# Patient Record
Sex: Female | Born: 1940 | Race: White | Hispanic: No | Smoking: Former smoker
Health system: Southern US, Community
[De-identification: ages and names within clinical notes are randomized; demographics above are authoritative.]

## PROBLEM LIST (undated history)

## (undated) DIAGNOSIS — E11649 Type 2 diabetes mellitus with hypoglycemia without coma: Secondary | ICD-10-CM

## (undated) DIAGNOSIS — G9332 Myalgic encephalomyelitis/chronic fatigue syndrome: Secondary | ICD-10-CM

## (undated) DIAGNOSIS — R5382 Chronic fatigue, unspecified: Secondary | ICD-10-CM

## (undated) DIAGNOSIS — M797 Fibromyalgia: Secondary | ICD-10-CM

## (undated) DIAGNOSIS — L409 Psoriasis, unspecified: Principal | ICD-10-CM

## (undated) DIAGNOSIS — E079 Disorder of thyroid, unspecified: Secondary | ICD-10-CM

## (undated) DIAGNOSIS — I10 Essential (primary) hypertension: Secondary | ICD-10-CM

## (undated) DIAGNOSIS — J189 Pneumonia, unspecified organism: Secondary | ICD-10-CM

## (undated) DIAGNOSIS — M4850XA Collapsed vertebra, not elsewhere classified, site unspecified, initial encounter for fracture: Secondary | ICD-10-CM

## (undated) DIAGNOSIS — E7800 Pure hypercholesterolemia, unspecified: Secondary | ICD-10-CM

## (undated) DIAGNOSIS — M170 Bilateral primary osteoarthritis of knee: Secondary | ICD-10-CM

## (undated) DIAGNOSIS — J449 Chronic obstructive pulmonary disease, unspecified: Secondary | ICD-10-CM

## (undated) DIAGNOSIS — E063 Autoimmune thyroiditis: Secondary | ICD-10-CM

## (undated) DIAGNOSIS — M1990 Unspecified osteoarthritis, unspecified site: Secondary | ICD-10-CM

## (undated) DIAGNOSIS — M5136 Other intervertebral disc degeneration, lumbar region: Secondary | ICD-10-CM

## (undated) DIAGNOSIS — L4050 Arthropathic psoriasis, unspecified: Secondary | ICD-10-CM

## (undated) HISTORY — DX: Pure hypercholesterolemia, unspecified: E78.00

## (undated) HISTORY — DX: Fibromyalgia: M79.7

## (undated) HISTORY — PX: ABDOMINAL SURGERY: SHX537

## (undated) HISTORY — DX: Psoriasis, unspecified: L40.9

## (undated) HISTORY — PX: ABDOMINAL HYSTERECTOMY: SHX81

## (undated) HISTORY — DX: Other intervertebral disc degeneration, lumbar region: M51.36

## (undated) HISTORY — PX: KNEE SURGERY: SHX244

## (undated) HISTORY — DX: Chronic obstructive pulmonary disease, unspecified: J44.9

## (undated) HISTORY — PX: CATARACT EXTRACTION: SUR2

## (undated) HISTORY — DX: Bilateral primary osteoarthritis of knee: M17.0

---

## 2002-04-16 ENCOUNTER — Ambulatory Visit (HOSPITAL_COMMUNITY): Admission: RE | Admit: 2002-04-16 | Discharge: 2002-04-16 | Payer: Self-pay | Admitting: Rheumatology

## 2002-04-16 ENCOUNTER — Encounter: Payer: Self-pay | Admitting: Rheumatology

## 2002-10-07 ENCOUNTER — Encounter: Payer: Self-pay | Admitting: Pediatrics

## 2002-10-07 ENCOUNTER — Ambulatory Visit (HOSPITAL_COMMUNITY): Admission: RE | Admit: 2002-10-07 | Discharge: 2002-10-07 | Payer: Self-pay | Admitting: Pediatrics

## 2003-08-19 ENCOUNTER — Ambulatory Visit (HOSPITAL_COMMUNITY): Admission: RE | Admit: 2003-08-19 | Discharge: 2003-08-19 | Payer: Self-pay | Admitting: Pediatrics

## 2004-10-12 ENCOUNTER — Ambulatory Visit (HOSPITAL_COMMUNITY): Admission: RE | Admit: 2004-10-12 | Discharge: 2004-10-12 | Payer: Self-pay | Admitting: Family Medicine

## 2006-07-05 ENCOUNTER — Ambulatory Visit (HOSPITAL_COMMUNITY): Admission: RE | Admit: 2006-07-05 | Discharge: 2006-07-05 | Payer: Self-pay | Admitting: Otolaryngology

## 2007-06-17 ENCOUNTER — Ambulatory Visit (HOSPITAL_COMMUNITY): Admission: RE | Admit: 2007-06-17 | Discharge: 2007-06-17 | Payer: Self-pay | Admitting: Family Medicine

## 2007-06-19 ENCOUNTER — Ambulatory Visit (HOSPITAL_COMMUNITY): Admission: RE | Admit: 2007-06-19 | Discharge: 2007-06-19 | Payer: Self-pay | Admitting: Family Medicine

## 2008-07-01 ENCOUNTER — Emergency Department (HOSPITAL_COMMUNITY): Admission: EM | Admit: 2008-07-01 | Discharge: 2008-07-02 | Payer: Self-pay | Admitting: Emergency Medicine

## 2010-04-09 ENCOUNTER — Encounter: Payer: Self-pay | Admitting: Family Medicine

## 2010-06-28 LAB — BASIC METABOLIC PANEL
BUN: 25 mg/dL — ABNORMAL HIGH (ref 6–23)
CO2: 27 mEq/L (ref 19–32)
Calcium: 9.3 mg/dL (ref 8.4–10.5)
Creatinine, Ser: 0.97 mg/dL (ref 0.4–1.2)
GFR calc non Af Amer: 58 mL/min — ABNORMAL LOW (ref >60)
Glucose, Bld: 100 mg/dL — ABNORMAL HIGH (ref 70–99)
Sodium: 137 mEq/L (ref 135–145)

## 2010-08-04 NOTE — Procedures (Signed)
Ashley Caldwell, Ashley Caldwell              ACCOUNT NO.:  [id number]   MEDICAL RECORD NO.:  [id number]          PATIENT TYPE:  OUT   LOCATION:  RAD                           FACILITY:  [facility]   PHYSICIAN:  Dani Gobble, MD       DATE OF BIRTH:  13-Feb-1941   DATE OF PROCEDURE:  10/12/2004  DATE OF DISCHARGE:                                  ECHOCARDIOGRAM   INDICATIONS:  Ashley Caldwell is a 70 year old female with diabetes mellitus  and asthma who is referred for echocardiogram for increasing shortness of  breath.   The technical quality of the study is quite limited secondary to the  patient's asthma and patient body habitus and poor acoustic windows. The  aorta was not well visualized but grossly appeared to be within normal  limits.   The left atrium grossly appears to be within normal limits in size. The  patient was in sinus rhythm during the procedure.   The interventricular septum were not well visualized.   The aortic valve also was poorly visualized, but overall leaflet excursion  appeared to be reasonable. The possibility of a shunt from the aorta to the  right atrium cannot be entirely excluded, although this is a very  technically limited study. Other considerations would be an eccentric jet of  tricuspid regurgitation.   The mitral valve appeared grossly structurally normal.   The pulmonic valve was not well visualized.   Tricuspid valve also appeared grossly structurally normal. The study is not  adequate for assessment of regurgitation valvular lesions.    The left ventricle grossly appeared normal in size. Overall left ventricular  systolic function appears reasonable, but this study is not adequate to  assess subtle regional wall motion abnormalities.   The right atrium and right ventricle are not well visualized.   IMPRESSION:  1.  Quite technically limited study secondary to the patient's asthma,      patient body habitus, and poor acoustic windows.  2.  The  overall left systolic function appears preserved, although the      endocardium was not well visualized, and this study is not adequate for      assessment of regional wall motion abnormalities.  3.  The study is not adequate for assessment of regurgitation valvular      lesions.  4.  The possibility of an aorta right atrial shunt cannot be entirely      excluded on this study, although this could also be eccentric tricuspid      regurgitation.  5.  Consider transesophageal echocardiogram and if clinically indicated.       AB/MEDQ  D:  10/12/2004  T:  10/13/2004  Job:  [id number]

## 2010-12-18 DIAGNOSIS — J189 Pneumonia, unspecified organism: Secondary | ICD-10-CM

## 2010-12-18 HISTORY — DX: Pneumonia, unspecified organism: J18.9

## 2011-01-13 ENCOUNTER — Encounter: Payer: Self-pay | Admitting: *Deleted

## 2011-01-13 ENCOUNTER — Other Ambulatory Visit: Payer: Self-pay

## 2011-01-13 ENCOUNTER — Inpatient Hospital Stay (HOSPITAL_COMMUNITY)
Admission: EM | Admit: 2011-01-13 | Discharge: 2011-01-16 | DRG: 194 | Disposition: A | Discharge status: Home / Self Care | Payer: PRIVATE HEALTH INSURANCE | Attending: Internal Medicine | Admitting: Internal Medicine

## 2011-01-13 ENCOUNTER — Emergency Department (HOSPITAL_COMMUNITY): Payer: PRIVATE HEALTH INSURANCE

## 2011-01-13 DIAGNOSIS — E11649 Type 2 diabetes mellitus with hypoglycemia without coma: Secondary | ICD-10-CM | POA: Diagnosis not present

## 2011-01-13 DIAGNOSIS — E119 Type 2 diabetes mellitus without complications: Secondary | ICD-10-CM | POA: Diagnosis present

## 2011-01-13 DIAGNOSIS — R5381 Other malaise: Secondary | ICD-10-CM | POA: Diagnosis present

## 2011-01-13 DIAGNOSIS — R509 Fever, unspecified: Secondary | ICD-10-CM | POA: Diagnosis present

## 2011-01-13 DIAGNOSIS — R739 Hyperglycemia, unspecified: Secondary | ICD-10-CM | POA: Diagnosis present

## 2011-01-13 DIAGNOSIS — J189 Pneumonia, unspecified organism: Principal | ICD-10-CM | POA: Diagnosis present

## 2011-01-13 DIAGNOSIS — I10 Essential (primary) hypertension: Secondary | ICD-10-CM | POA: Diagnosis present

## 2011-01-13 DIAGNOSIS — M8448XA Pathological fracture, other site, initial encounter for fracture: Secondary | ICD-10-CM | POA: Diagnosis present

## 2011-01-13 DIAGNOSIS — J159 Unspecified bacterial pneumonia: Secondary | ICD-10-CM

## 2011-01-13 DIAGNOSIS — E065 Other chronic thyroiditis: Secondary | ICD-10-CM | POA: Diagnosis present

## 2011-01-13 DIAGNOSIS — M4850XA Collapsed vertebra, not elsewhere classified, site unspecified, initial encounter for fracture: Secondary | ICD-10-CM | POA: Diagnosis present

## 2011-01-13 DIAGNOSIS — M4854XA Collapsed vertebra, not elsewhere classified, thoracic region, initial encounter for fracture: Secondary | ICD-10-CM

## 2011-01-13 DIAGNOSIS — D649 Anemia, unspecified: Secondary | ICD-10-CM | POA: Diagnosis present

## 2011-01-13 DIAGNOSIS — L4050 Arthropathic psoriasis, unspecified: Secondary | ICD-10-CM | POA: Diagnosis present

## 2011-01-13 DIAGNOSIS — R531 Weakness: Secondary | ICD-10-CM | POA: Diagnosis present

## 2011-01-13 DIAGNOSIS — R5383 Other fatigue: Secondary | ICD-10-CM | POA: Diagnosis present

## 2011-01-13 DIAGNOSIS — Z6841 Body Mass Index (BMI) 40.0 and over, adult: Secondary | ICD-10-CM

## 2011-01-13 DIAGNOSIS — E039 Hypothyroidism, unspecified: Secondary | ICD-10-CM | POA: Diagnosis present

## 2011-01-13 DIAGNOSIS — J45909 Unspecified asthma, uncomplicated: Secondary | ICD-10-CM | POA: Diagnosis not present

## 2011-01-13 DIAGNOSIS — E871 Hypo-osmolality and hyponatremia: Secondary | ICD-10-CM | POA: Diagnosis present

## 2011-01-13 HISTORY — DX: Unspecified osteoarthritis, unspecified site: M19.90

## 2011-01-13 HISTORY — DX: Autoimmune thyroiditis: E06.3

## 2011-01-13 HISTORY — DX: Type 2 diabetes mellitus with hypoglycemia without coma: E11.649

## 2011-01-13 HISTORY — DX: Arthropathic psoriasis, unspecified: L40.50

## 2011-01-13 HISTORY — DX: Collapsed vertebra, not elsewhere classified, site unspecified, initial encounter for fracture: M48.50XA

## 2011-01-13 HISTORY — DX: Pneumonia, unspecified organism: J18.9

## 2011-01-13 HISTORY — DX: Myalgic encephalomyelitis/chronic fatigue syndrome: G93.32

## 2011-01-13 HISTORY — DX: Chronic fatigue, unspecified: R53.82

## 2011-01-13 HISTORY — DX: Morbid (severe) obesity due to excess calories: E66.01

## 2011-01-13 HISTORY — DX: Essential (primary) hypertension: I10

## 2011-01-13 HISTORY — DX: Disorder of thyroid, unspecified: E07.9

## 2011-01-13 LAB — POCT I-STAT, CHEM 8
BUN: 11 mg/dL (ref 6–23)
Calcium, Ion: 1.07 mmol/L — ABNORMAL LOW (ref 1.12–1.32)
Chloride: 94 mEq/L — ABNORMAL LOW (ref 96–112)
HCT: 40 % (ref 36.0–46.0)
Sodium: 132 mEq/L — ABNORMAL LOW (ref 135–145)
TCO2: 26 mmol/L (ref 0–100)

## 2011-01-13 LAB — URINALYSIS, ROUTINE W REFLEX MICROSCOPIC
Bilirubin Urine: NEGATIVE
Hgb urine dipstick: NEGATIVE
Nitrite: NEGATIVE
Specific Gravity, Urine: 1.025 (ref 1.005–1.030)
Urobilinogen, UA: 0.2 mg/dL (ref 0.0–1.0)

## 2011-01-13 LAB — CBC
HCT: 34.7 % — ABNORMAL LOW (ref 36.0–46.0)
Hemoglobin: 11.7 g/dL — ABNORMAL LOW (ref 12.0–15.0)
MCH: 31.5 pg (ref 26.0–34.0)
MCHC: 33.7 g/dL (ref 30.0–36.0)
RDW: 13.5 % (ref 11.5–15.5)

## 2011-01-13 LAB — COMPREHENSIVE METABOLIC PANEL
ALT: 11 U/L (ref 0–35)
AST: 14 U/L (ref 0–37)
Albumin: 2.7 g/dL — ABNORMAL LOW (ref 3.5–5.2)
Alkaline Phosphatase: 95 U/L (ref 39–117)
CO2: 27 mEq/L (ref 19–32)
Chloride: 92 mEq/L — ABNORMAL LOW (ref 96–112)
GFR calc non Af Amer: 62 mL/min — ABNORMAL LOW (ref >90)
Potassium: 4.1 mEq/L (ref 3.5–5.1)
Total Bilirubin: 0.6 mg/dL (ref 0.3–1.2)

## 2011-01-13 LAB — DIFFERENTIAL
Basophils Absolute: 0 10*3/uL (ref 0.0–0.1)
Basophils Relative: 0 % (ref 0–1)
Eosinophils Absolute: 0.2 10*3/uL (ref 0.0–0.7)
Eosinophils Relative: 3 % (ref 0–5)
Monocytes Absolute: 1.2 10*3/uL — ABNORMAL HIGH (ref 0.1–1.0)
Monocytes Relative: 17 % — ABNORMAL HIGH (ref 3–12)

## 2011-01-13 LAB — URINE MICROSCOPIC-ADD ON

## 2011-01-13 MED ORDER — SENNA 8.6 MG PO TABS: 2.0000 | ORAL_TABLET | Freq: Every day | ORAL | Status: DC | PRN

## 2011-01-13 MED ORDER — CYCLOBENZAPRINE HCL 10 MG PO TABS
10.0000 mg | ORAL_TABLET | Freq: Every day | ORAL | Status: DC
  Administered 2011-01-13 – 2011-01-15 (×5): 10 mg via ORAL
  Filled 2011-01-13 (×4): qty 1

## 2011-01-13 MED ORDER — MOXIFLOXACIN HCL IN NACL 400 MG/250ML IV SOLN
400.0000 mg | Freq: Once | INTRAVENOUS | Status: AC
  Administered 2011-01-13: 400 mg via INTRAVENOUS
  Filled 2011-01-13 (×2): qty 250

## 2011-01-13 MED ORDER — ONDANSETRON HCL 4 MG/2ML IJ SOLN: 4.0000 mg | Freq: Four times a day (QID) | INTRAMUSCULAR | Status: DC | PRN

## 2011-01-13 MED ORDER — ALBUTEROL SULFATE (5 MG/ML) 0.5% IN NEBU
2.5000 mg | INHALATION_SOLUTION | Freq: Four times a day (QID) | RESPIRATORY_TRACT | Status: DC
  Administered 2011-01-13 – 2011-01-14 (×3): 2.5 mg via RESPIRATORY_TRACT
  Filled 2011-01-13 (×2): qty 0.5

## 2011-01-13 MED ORDER — SODIUM CHLORIDE 0.9 % IV SOLN: INTRAVENOUS | Status: DC

## 2011-01-13 MED ORDER — ALBUTEROL SULFATE HFA 108 (90 BASE) MCG/ACT IN AERS
2.0000 | INHALATION_SPRAY | Freq: Once | RESPIRATORY_TRACT | Status: AC
  Administered 2011-01-13: 2 via RESPIRATORY_TRACT
  Filled 2011-01-13: qty 6.7

## 2011-01-13 MED ORDER — OXYCODONE HCL 5 MG PO TABS: 5.0000 mg | ORAL_TABLET | ORAL | Status: DC | PRN

## 2011-01-13 MED ORDER — ACETAMINOPHEN 650 MG RE SUPP: 650.0000 mg | Freq: Four times a day (QID) | RECTAL | Status: DC | PRN

## 2011-01-13 MED ORDER — ALBUTEROL SULFATE (5 MG/ML) 0.5% IN NEBU
2.5000 mg | INHALATION_SOLUTION | RESPIRATORY_TRACT | Status: DC | PRN
  Filled 2011-01-13: qty 0.5

## 2011-01-13 MED ORDER — HYDROMORPHONE HCL 1 MG/ML IJ SOLN: 0.5000 mg | INTRAMUSCULAR | Status: DC | PRN

## 2011-01-13 MED ORDER — LISINOPRIL 10 MG PO TABS
30.0000 mg | ORAL_TABLET | Freq: Every day | ORAL | Status: DC
  Administered 2011-01-13: 30 mg via ORAL
  Filled 2011-01-13 (×2): qty 3

## 2011-01-13 MED ORDER — ONDANSETRON HCL 4 MG PO TABS: 4.0000 mg | ORAL_TABLET | Freq: Four times a day (QID) | ORAL | Status: DC | PRN

## 2011-01-13 MED ORDER — ALUM & MAG HYDROXIDE-SIMETH 200-200-20 MG/5ML PO SUSP: 30.0000 mL | Freq: Four times a day (QID) | ORAL | Status: DC | PRN

## 2011-01-13 MED ORDER — ALBUTEROL SULFATE (5 MG/ML) 0.5% IN NEBU: 2.5000 mg | INHALATION_SOLUTION | RESPIRATORY_TRACT | Status: DC | PRN

## 2011-01-13 MED ORDER — GUAIFENESIN ER 600 MG PO TB12: 600.0000 mg | ORAL_TABLET | Freq: Two times a day (BID) | ORAL | Status: DC | PRN

## 2011-01-13 MED ORDER — ENOXAPARIN SODIUM 150 MG/ML ~~LOC~~ SOLN
1.0000 mg/kg | Freq: Two times a day (BID) | SUBCUTANEOUS | Status: DC
  Filled 2011-01-13 (×4): qty 1

## 2011-01-13 MED ORDER — FOLIC ACID 1 MG PO TABS
2.0000 mg | ORAL_TABLET | Freq: Every day | ORAL | Status: DC
  Administered 2011-01-13: 1 mg via ORAL
  Administered 2011-01-14 – 2011-01-16 (×3): 2 mg via ORAL
  Filled 2011-01-13 (×3): qty 2
  Filled 2011-01-13: qty 1

## 2011-01-13 MED ORDER — ENOXAPARIN SODIUM 150 MG/ML ~~LOC~~ SOLN: 1.0000 mg/kg | Freq: Two times a day (BID) | SUBCUTANEOUS | Status: DC

## 2011-01-13 MED ORDER — HYDROMORPHONE HCL 1 MG/ML IJ SOLN
0.5000 mg | INTRAMUSCULAR | Status: DC | PRN
  Administered 2011-01-15: 1 mg via INTRAVENOUS
  Filled 2011-01-13: qty 1

## 2011-01-13 MED ORDER — INSULIN GLARGINE 100 UNIT/ML ~~LOC~~ SOLN
80.0000 [IU] | Freq: Every day | SUBCUTANEOUS | Status: DC
  Administered 2011-01-13 – 2011-01-15 (×3): 80 [IU] via SUBCUTANEOUS
  Filled 2011-01-13: qty 3

## 2011-01-13 MED ORDER — ETANERCEPT 50 MG/ML ~~LOC~~ SOLN
50.0000 mg | SUBCUTANEOUS | Status: DC
  Filled 2011-01-13: qty 0.98

## 2011-01-13 MED ORDER — ENOXAPARIN SODIUM 40 MG/0.4ML ~~LOC~~ SOLN
40.0000 mg | SUBCUTANEOUS | Status: DC
  Filled 2011-01-13: qty 0.4

## 2011-01-13 MED ORDER — FLUTICASONE-SALMETEROL 100-50 MCG/DOSE IN AEPB
1.0000 | INHALATION_SPRAY | Freq: Two times a day (BID) | RESPIRATORY_TRACT | Status: DC
  Administered 2011-01-14 – 2011-01-16 (×4): 1 via RESPIRATORY_TRACT
  Filled 2011-01-13: qty 14

## 2011-01-13 MED ORDER — LORATADINE 10 MG PO TABS
10.0000 mg | ORAL_TABLET | Freq: Every day | ORAL | Status: DC
  Administered 2011-01-13 – 2011-01-14 (×2): 10 mg via ORAL
  Filled 2011-01-13 (×2): qty 1

## 2011-01-13 MED ORDER — ACETAMINOPHEN 325 MG PO TABS: 650.0000 mg | ORAL_TABLET | Freq: Four times a day (QID) | ORAL | Status: DC | PRN

## 2011-01-13 MED ORDER — ALBUTEROL SULFATE HFA 108 (90 BASE) MCG/ACT IN AERS
INHALATION_SPRAY | RESPIRATORY_TRACT | Status: AC
  Filled 2011-01-13: qty 6.7

## 2011-01-13 MED ORDER — INSULIN ASPART 100 UNIT/ML ~~LOC~~ SOLN
0.0000 [IU] | Freq: Three times a day (TID) | SUBCUTANEOUS | Status: DC
  Administered 2011-01-14 – 2011-01-16 (×3): 5 [IU] via SUBCUTANEOUS
  Filled 2011-01-13: qty 3

## 2011-01-13 MED ORDER — BENZONATATE 100 MG PO CAPS
100.0000 mg | ORAL_CAPSULE | Freq: Three times a day (TID) | ORAL | Status: DC | PRN
  Administered 2011-01-14: 100 mg via ORAL
  Filled 2011-01-13: qty 1

## 2011-01-13 MED ORDER — MOXIFLOXACIN HCL IN NACL 400 MG/250ML IV SOLN
400.0000 mg | INTRAVENOUS | Status: DC
  Administered 2011-01-14 – 2011-01-15 (×2): 400 mg via INTRAVENOUS
  Filled 2011-01-13 (×4): qty 250

## 2011-01-13 MED ORDER — MONTELUKAST SODIUM 10 MG PO TABS
10.0000 mg | ORAL_TABLET | Freq: Every day | ORAL | Status: DC
  Administered 2011-01-13 – 2011-01-16 (×4): 10 mg via ORAL
  Filled 2011-01-13 (×4): qty 1

## 2011-01-13 MED ORDER — SODIUM CHLORIDE 0.9 % IV SOLN
INTRAVENOUS | Status: DC
  Administered 2011-01-13: 22:00:00 via INTRAVENOUS

## 2011-01-13 MED ORDER — DULOXETINE HCL 60 MG PO CPEP
60.0000 mg | ORAL_CAPSULE | Freq: Two times a day (BID) | ORAL | Status: DC
  Administered 2011-01-13 – 2011-01-16 (×6): 60 mg via ORAL
  Filled 2011-01-13 (×7): qty 1

## 2011-01-13 MED ORDER — ENOXAPARIN SODIUM 40 MG/0.4ML ~~LOC~~ SOLN
40.0000 mg | SUBCUTANEOUS | Status: DC
  Administered 2011-01-13: 40 mg via SUBCUTANEOUS

## 2011-01-13 MED ORDER — ZOLPIDEM TARTRATE 5 MG PO TABS
10.0000 mg | ORAL_TABLET | Freq: Every day | ORAL | Status: DC
  Administered 2011-01-13 – 2011-01-15 (×3): 10 mg via ORAL
  Filled 2011-01-13 (×3): qty 2

## 2011-01-13 MED ORDER — OXYCODONE HCL 5 MG PO TABS
5.0000 mg | ORAL_TABLET | Freq: Three times a day (TID) | ORAL | Status: DC
  Administered 2011-01-13 – 2011-01-16 (×8): 5 mg via ORAL
  Filled 2011-01-13 (×8): qty 1

## 2011-01-13 MED ORDER — INSULIN ASPART 100 UNIT/ML ~~LOC~~ SOLN
0.0000 [IU] | Freq: Every day | SUBCUTANEOUS | Status: DC
  Administered 2011-01-14: 2 [IU] via SUBCUTANEOUS

## 2011-01-13 MED ORDER — SODIUM CHLORIDE 0.9 % IV SOLN
Freq: Once | INTRAVENOUS | Status: AC
  Administered 2011-01-13: 19:00:00 via INTRAVENOUS

## 2011-01-13 MED ORDER — LEVOTHYROXINE SODIUM 137 MCG PO TABS
137.0000 ug | ORAL_TABLET | Freq: Every day | ORAL | Status: DC
  Administered 2011-01-14 – 2011-01-16 (×3): 137 ug via ORAL
  Filled 2011-01-13 (×5): qty 1

## 2011-01-13 NOTE — ED Notes (Signed)
Resting quietly, pt states her knee pain is chronic, back pain related to current issue. States BP is always about 100 systolic.

## 2011-01-13 NOTE — ED Provider Notes (Signed)
Scribed for Flint Melter, MD, the patient was seen in room [facility]06/[facility]06 . This chart was scribed by Ellie Lunch.   CSN: [id number] Arrival date & time: 01/13/2011  4:16 PM   First MD Initiated Contact with Patient 01/13/11 1619      Chief Complaint  Patient presents with  . Shortness of Breath  . Fever  . Chills    (Consider location/radiation/quality/duration/timing/severity/associated sxs/prior treatment) HPI Ashley Caldwell is a 70 y.o. female who presents to the Emergency Department complaining of SOB. Pt reports she developed SOB with associated productive cough with grey sputum, subjective fever, chills, weakness and diaphoresis after she received Methotrexate and Enbrel from her endocrinologist Dr. Brynda Greathouse ~8 days ago. Pt reports that she has also had some occasional wheezing. Pt says her SOB is aggravated by the supine position and that she is not able to sleep laying flat. Pt has treated subjective fever with tylenol with some improvement. Pt denies chest pain. Additionally Pt reports she has a h/o of DM and has had increased frequency. Denies dysuria. Pt says she has not been monitoring her blood sugar lately b/c she is out of strips. Pt also reports decreased appetite which may be b/c she received lapband surgery ~3 months ago. Additionally Pt reports intermittent abdominal pain described as minimal, back pain, and leg pain described as cramps. Pt also reports that she has ben taken either methotrexate or enbrel in the past two days due to her illness.     Past Medical History  Diagnosis Date  . Diabetes mellitus   . CFS (chronic fatigue syndrome)   . Arthritis   . Thyroid disease     Past Surgical History  Procedure Date  . Abdominal surgery     lapband ~ 3 months ago by Dr. Lily Peer  . Abdominal hysterectomy     History reviewed. No pertinent family history.  History  Substance Use Topics  . Smoking status: Former Games developer  . Smokeless tobacco: Not on file    . Alcohol Use: No    Review of Systems  Constitutional: Positive for fever, chills, diaphoresis and appetite change (decreased).  Respiratory: Positive for cough, shortness of breath and wheezing.   Cardiovascular: Negative for chest pain.  Gastrointestinal: Positive for abdominal pain. Negative for nausea and vomiting.  Genitourinary: Positive for frequency. Negative for dysuria.  Musculoskeletal: Positive for back pain.  Neurological: Positive for weakness.  All other systems reviewed and are negative.    Allergies  Augmentin  Home Medications   Current Outpatient Rx  Name Route Sig Dispense Refill  . CETIRIZINE HCL 10 MG PO TABS Oral Take 10 mg by mouth daily.      . CYCLOBENZAPRINE HCL 10 MG PO TABS Oral Take 10 mg by mouth at bedtime.      . DULOXETINE HCL 60 MG PO CPEP Oral Take 60 mg by mouth 2 (two) times daily.      Marland Kitchen ETANERCEPT 50 MG/ML [place] SOLN Subcutaneous Inject 50 mg into the skin once a week. Give on Saturday!! Patient has not given injection in 2 weeks!!!    . FLUTICASONE-SALMETEROL 100-50 MCG/DOSE IN AEPB Inhalation Inhale 1 puff into the lungs every 12 (twelve) hours.      Marland Kitchen FOLIC ACID 1 MG PO TABS Oral Take 2 mg by mouth daily.      . INSULIN GLARGINE 100 UNIT/ML [place] SOLN Subcutaneous Inject 80 Units into the skin at bedtime.      . INSULIN LISPRO (HUMAN) 100  UNIT/ML [place] SOLN Subcutaneous Inject 20 Units into the skin 3 (three) times daily before meals.      Marland Kitchen LEVOTHYROXINE SODIUM 137 MCG PO TABS Oral Take 137 mcg by mouth daily.      Marland Kitchen LISINOPRIL 30 MG PO TABS Oral Take 30 mg by mouth daily.      Marland Kitchen METHOTREXATE 2.5 MG PO TABS Oral Take 20 mg by mouth once a week. Caution:Chemotherapy. Protect from light.Patient takes on Saturday!!! Patient states she has Not taking in 2 weeks!!     . MONTELUKAST SODIUM 10 MG PO TABS Oral Take 10 mg by mouth daily.      . OXYCODONE HCL 5 MG PO TABS Oral Take 5 mg by mouth 3 (three) times daily.      Marland Kitchen ZOLPIDEM TARTRATE 10 MG PO  TABS Oral Take 10 mg by mouth at bedtime.        BP 150/102  Pulse 94  Temp(Src) 99.2 F (37.3 C) (Oral)  Resp 25  Ht 5\' 8"  (1.727 m)  Wt 328 lb (148.78 kg)  BMI 49.87 kg/m2  SpO2 96%  Physical Exam  Constitutional: She is oriented to person, place, and time.  HENT:       Lips dry. Mouth normal.  Eyes: Conjunctivae are normal. No scleral icterus.  Neck: Normal range of motion. Neck supple. No JVD present.       Cervical Paraspinal tenderness  Cardiovascular: Normal rate, regular rhythm and normal heart sounds.   No murmur heard. Pulmonary/Chest: Effort normal. She has no wheezes. She has no rales.       Mildly decreased air sounds. No rhonchi. Right upper chest wall tenderness.   Abdominal: Soft. There is no tenderness.  Musculoskeletal: She exhibits no edema.       Right calf tenderness. No BLE tenderness.   Lymphadenopathy:    She has no cervical adenopathy.  Neurological: She is alert and oriented to person, place, and time.  Skin: Skin is warm and dry.  Psychiatric: She has a normal mood and affect.    ED Course  Procedures (including critical care time)  OTHER DATA REVIEWED: Nursing notes, vital signs, and past medical records reviewed.  DIAGNOSTIC STUDIES: Oxygen Saturation is 96% on room air, normal by my interpretation.     Date: 01/13/2011  Rate: 88  Rhythm: normal sinus rhythm  QRS Axis: normal  Intervals: normal  ST/T Wave abnormalities: normal  Conduction Disutrbances:right bundle branch block  Narrative Interpretation:   Old EKG Reviewed: none available   Labs Reviewed  URINALYSIS, ROUTINE W REFLEX MICROSCOPIC - Abnormal; Notable for the following:    Appearance HAZY (*)    Glucose, UA 500 (*)    Ketones, ur TRACE (*)    Protein, ur TRACE (*)    All other components within normal limits  POCT I-STAT, CHEM 8 - Abnormal; Notable for the following:    Sodium 132 (*)    Chloride 94 (*)    Glucose, Bld 268 (*)    Calcium, Ion 1.07 (*)    All  other components within normal limits  URINE MICROSCOPIC-ADD ON - Abnormal; Notable for the following:    Squamous Epithelial / LPF MANY (*)    Bacteria, UA FEW (*)    All other components within normal limits  CBC - Abnormal; Notable for the following:    RBC 3.72 (*)    Hemoglobin 11.7 (*)    HCT 34.7 (*)    All other components within normal  limits  DIFFERENTIAL - Abnormal; Notable for the following:    Monocytes Relative 17 (*)    Monocytes Absolute 1.2 (*)    All other components within normal limits  I-STAT, CHEM 8  URINE CULTURE   Dg Chest 2 View  01/13/2011  *RADIOLOGY REPORT*  Clinical Data: Cough.  Shortness of left.  Generalized weakness.  CHEST - 2 VIEW 01/13/2011:  Comparison: Two-view chest x-ray 10/12/2004 and 08/19/2003 [facility].  Findings: Cardiac silhouette mildly enlarged, having increased in size in the interval.  Thoracic aorta mildly tortuous, unchanged. Hilar and mediastinal contours otherwise unremarkable.  Patchy airspace opacities in the right lower lobe.  Lungs otherwise clear. Mild pulmonary venous hypertension without overt edema. Compression fracture of what I believe is the T8 vertebral body, new since the prior examination.  IMPRESSION: Right lower lobe pneumonia.  Mild cardiomegaly with interval increase in heart size since 2006.  Original Report Authenticated By: Arnell Sieving, M.D.    17:15 EDP at PT bedside. Discussed diagnostic possibilities including bronchitis and pneumonia. Discussed plan to check urine, cbc, and blood sugar.  6:54 PM Pt recheck. Discussed xray results indicating pneumonia.  Discussed possible admission with PT. Pt was agreeable to admission.   ED MEDICATIONS  Medications  etanercept (ENBREL) 50 MG/ML injection   albuterol (PROVENTIL HFA;VENTOLIN HFA) 108 (90 BASE) MCG/ACT inhaler 2 puff     Final diagnoses:  Community acquired bacterial pneumonia  Compression fracture of thoracic vertebra, non-traumatic    Hyperglycemia   MDM  PNE with DM, out of control, and relative immunocompromise, which will require an observation in house. Incidental thoracic compression fracture.  I personally performed the services described in this documentation, which was scribed in my presence. The recorded information has been reviewed and considered.         Flint Melter, MD 01/13/11 1945

## 2011-01-13 NOTE — H&P (Signed)
PCP:   Dr. Milford Cage   Chief Complaint:   Cough Fevers Chills and Weakness    HPI: Ashley Caldwell is an 70 y.o. female who is presenting with 9 days of worsening cough, fever, chills, and weakness. She also reports increased dizziness upon standing.   She has been coughing up greenish sputum.  She denies any nausea or vomiting or diarrhea,but does report malaise and decreased appetite. Her blood sugars have also been elevated during this time as well.  She did have a pneumovax and flu vaccine about 1 week ago.     In the Emergency Department she was found to have a RLL Pneumonia on Chest X-Ray, and she was then started on IV Avelox and referred for admission.  She is on immunosuppressive therapy  for psoriatic arthritis, however she has not taken her weekly methotrexate therapy for the past 68 weeks.    Past Medical History  Diagnosis Date  . Diabetes mellitus   . CFS (chronic fatigue syndrome)   . Arthritis   . Thyroid disease         [place] Thyroiditis       Asthma       HTN       Morbid Obesity  Past Surgical History  Procedure Date  . Abdominal surgery     lapband  . Abdominal hysterectomy   . Knee surgery     Medications:  HOME MEDS: Prior to Admission medications   Medication Sig Start Date End Date Taking? Authorizing Provider  cetirizine (ZYRTEC) 10 MG tablet Take 10 mg by mouth daily.     Yes Historical Provider, MD  cyclobenzaprine (FLEXERIL) 10 MG tablet Take 10 mg by mouth at bedtime.     Yes Historical Provider, MD  DULoxetine (CYMBALTA) 60 MG capsule Take 60 mg by mouth 2 (two) times daily.     Yes Historical Provider, MD  etanercept (ENBREL) 50 MG/ML injection Inject 50 mg into the skin once a week. Give on Saturday!! Patient has not given injection in 2 weeks!!!   Yes Historical Provider, MD  Fluticasone-Salmeterol (ADVAIR) 100-50 MCG/DOSE AEPB Inhale 1 puff into the lungs every 12 (twelve) hours.     Yes Historical Provider, MD  folic acid (FOLVITE) 1 MG  tablet Take 2 mg by mouth daily.     Yes Historical Provider, MD  insulin glargine (LANTUS) 100 UNIT/ML injection Inject 80 Units into the skin at bedtime.     Yes Historical Provider, MD  insulin lispro (HUMALOG) 100 UNIT/ML injection Inject 20 Units into the skin 3 (three) times daily before meals.     Yes Historical Provider, MD  levothyroxine (SYNTHROID, LEVOTHROID) 137 MCG tablet Take 137 mcg by mouth daily.     Yes Historical Provider, MD  lisinopril (PRINIVIL,ZESTRIL) 30 MG tablet Take 30 mg by mouth daily.     Yes Historical Provider, MD  methotrexate (RHEUMATREX) 2.5 MG tablet Take 20 mg by mouth once a week. Caution:Chemotherapy. Protect from light.Patient takes on Saturday!!! Patient states she has Not taking in 2 weeks!!    Yes Historical Provider, MD  montelukast (SINGULAIR) 10 MG tablet Take 10 mg by mouth daily.     Yes Historical Provider, MD  oxyCODONE (OXY IR/ROXICODONE) 5 MG immediate release tablet Take 5 mg by mouth 3 (three) times daily.     Yes Historical Provider, MD  zolpidem (AMBIEN) 10 MG tablet Take 10 mg by mouth at bedtime.     Yes Historical Provider, MD  Allergies:  Allergies  Allergen Reactions  . Augmentin Nausea And Vomiting    Social History:   reports that she has quit smoking. She does not have any smokeless tobacco history on file. She reports that she does not drink alcohol or use illicit drugs.  Family History: Mother had a CVA and HTN, Father had DM2.     Review of Systems:  The patient denies vision loss, decreased hearing, hoarseness, chest pain, syncope, dyspnea on exertion, peripheral edema,hemoptysis, abdominal pain, melena, hematochezia, severe indigestion/heartburn, hematuria, incontinence, genital sores, suspicious skin lesions, transient blindness, difficulty walking, depression, unusual weight change, abnormal bleeding, enlarged lymph nodes, angioedema, and breast masses.    Physical Exam: Filed Vitals:   01/13/11 1735 01/13/11 1814  01/13/11 2009 01/13/11 2052  BP:  122/55 101/41 101/59  Pulse:  87 92 92  Temp:    98.6 F (37 C)  TempSrc:    Oral  Resp:  20 16 18   Height:      Weight:      SpO2: 94% 95% 93% 96%   Blood pressure 101/59, pulse 92, temperature 98.6 F (37 C), temperature source Oral, resp. rate 18, height 5\' 8"  (1.727 m), weight 148.78 kg (328 lb), SpO2 96.00%.  GEN:   Pleasant Morbidly Obese 70 year old female lying in the stretcher in no acute distress or visible discomfort; cooperative with exam PSYCH: She is alert and oriented x4; does not appear anxious does not appear depressed; affect is normal HEENT: Mucous membranes pink and anicteric; PERRLA; EOM intact; no cervical lymphadenopathy nor thyromegaly or carotid bruit; no JVD; Breasts:: Not examined CHEST WALL: No tenderness CHEST: Normal respiration, clear to auscultation bilaterally HEART: Regular rate and rhythm; no murmurs rubs or gallops BACK: No kyphosis or scoliosis; no CVA tenderness ABDOMEN: Obese, soft non-tender; no masses, no organomegaly, normal abdominal bowel sounds; +pannus; no intertriginous candida. Rectal Exam: Not done EXTREMITIES: No bone or joint deformity; age-appropriate arthropathy of the hands and knees; no edema; no ulcerations. Genitalia: not examined PULSES: 2+ and symmetric SKIN: Normal hydration no rash or ulceration CNS: Cranial nerves 2-12 grossly intact no focal neurologic deficit   Labs & Imaging Results for orders placed during the hospital encounter of 01/13/11 (from the past 48 hour(s))  POCT I-STAT, CHEM 8     Status: Abnormal   Collection Time   01/13/11  5:16 PM      Component Value Range Comment   Sodium 132 (*) 135 - 145 (mEq/L)    Potassium 4.6  3.5 - 5.1 (mEq/L)    Chloride 94 (*) 96 - 112 (mEq/L)    BUN 11  6 - 23 (mg/dL)    Creatinine, Ser 8.29  0.50 - 1.10 (mg/dL)    Glucose, Bld 562 (*) 70 - 99 (mg/dL)    Calcium, Ion 1.30 (*) 1.12 - 1.32 (mmol/L)    TCO2 26  0 - 100 (mmol/L)     Hemoglobin 13.6  12.0 - 15.0 (g/dL)    HCT 86.5  78.4 - 69.6 (%)   URINALYSIS, ROUTINE W REFLEX MICROSCOPIC     Status: Abnormal   Collection Time   01/13/11  6:19 PM      Component Value Range Comment   Color, Urine YELLOW  YELLOW     Appearance HAZY (*) CLEAR     Specific Gravity, Urine 1.025  1.005 - 1.030     pH 5.5  5.0 - 8.0     Glucose, UA 500 (*) NEGATIVE (mg/dL)  Hgb urine dipstick NEGATIVE  NEGATIVE     Bilirubin Urine NEGATIVE  NEGATIVE     Ketones, ur TRACE (*) NEGATIVE (mg/dL)    Protein, ur TRACE (*) NEGATIVE (mg/dL)    Urobilinogen, UA 0.2  0.0 - 1.0 (mg/dL)    Nitrite NEGATIVE  NEGATIVE     Leukocytes, UA NEGATIVE  NEGATIVE    URINE MICROSCOPIC-ADD ON     Status: Abnormal   Collection Time   01/13/11  6:19 PM      Component Value Range Comment   Squamous Epithelial / LPF MANY (*) RARE     WBC, UA 0-2  <3 (WBC/hpf)    RBC / HPF 0-2  <3 (RBC/hpf)    Bacteria, UA FEW (*) RARE     Urine-Other MUCOUS PRESENT     CBC     Status: Abnormal   Collection Time   01/13/11  7:13 PM      Component Value Range Comment   WBC 7.2  4.0 - 10.5 (K/uL)    RBC 3.72 (*) 3.87 - 5.11 (MIL/uL)    Hemoglobin 11.7 (*) 12.0 - 15.0 (g/dL)    HCT 41.3 (*) 24.4 - 46.0 (%)    MCV 93.3  78.0 - 100.0 (fL)    MCH 31.5  26.0 - 34.0 (pg)    MCHC 33.7  30.0 - 36.0 (g/dL)    RDW 01.0  27.2 - 53.6 (%)    Platelets 311  150 - 400 (K/uL)   DIFFERENTIAL     Status: Abnormal   Collection Time   01/13/11  7:13 PM      Component Value Range Comment   Neutrophils Relative 62  43 - 77 (%)    Neutro Abs 4.4  1.7 - 7.7 (K/uL)    Lymphocytes Relative 18  12 - 46 (%)    Lymphs Abs 1.3  0.7 - 4.0 (K/uL)    Monocytes Relative 17 (*) 3 - 12 (%)    Monocytes Absolute 1.2 (*) 0.1 - 1.0 (K/uL)    Eosinophils Relative 3  0 - 5 (%)    Eosinophils Absolute 0.2  0.0 - 0.7 (K/uL)    Basophils Relative 0  0 - 1 (%)    Basophils Absolute 0.0  0.0 - 0.1 (K/uL)    Dg Chest 2 View  01/13/2011  *RADIOLOGY  REPORT*  Clinical Data: Cough.  Shortness of left.  Generalized weakness.  CHEST - 2 VIEW 01/13/2011:  Comparison: Two-view chest x-ray 10/12/2004 and 08/19/2003 [facility].  Findings: Cardiac silhouette mildly enlarged, having increased in size in the interval.  Thoracic aorta mildly tortuous, unchanged. Hilar and mediastinal contours otherwise unremarkable.  Patchy airspace opacities in the right lower lobe.  Lungs otherwise clear. Mild pulmonary venous hypertension without overt edema. Compression fracture of what I believe is the T8 vertebral body, new since the prior examination.  IMPRESSION: Right lower lobe pneumonia.  Mild cardiomegaly with interval increase in heart size since 2006.  Original Report Authenticated By: Arnell Sieving, M.D.      Assessment: Present on Admission:  .Pneumonia .Hyperglycemia .Fever .Weakness generalized .DM type 2 (diabetes mellitus, type 2) .HTN (hypertension), benign .Psoriatic arthritis .Anemia .Thyroiditis, chronic .Morbid obesity   Plan:    Patient will be admitted for 23 Hour Observation and will continue to receive IV Avelox for the Pneumonia, along with Albuterol nebulizer treatments, and supplememental Oxygen as needed. Antipyretic and anti-tussin therapies have been ordered.And IV Fluids have also been ordered for  fluid resusitation and maintenance therapy.  Her dizziness(orthostasis) should improve with rehydration.  Her blood sugars will be monitored and Sliding scale Insulin coverage has been ordered for elevated bolod sugars.  Her regular home medications have been reconciled.  Patient is a Full Code, and further workup will ensue pending patient's clinical course.     Other plans as per orders. Ron Parker MD  01/13/2011, 9:14 PM

## 2011-01-13 NOTE — ED Notes (Signed)
Pt c/o sob with chills, fever, and headache.

## 2011-01-14 DIAGNOSIS — E871 Hypo-osmolality and hyponatremia: Secondary | ICD-10-CM | POA: Diagnosis present

## 2011-01-14 LAB — BASIC METABOLIC PANEL
CO2: 28 mEq/L (ref 19–32)
Calcium: 8.7 mg/dL (ref 8.4–10.5)
Chloride: 93 mEq/L — ABNORMAL LOW (ref 96–112)
Creatinine, Ser: 1.06 mg/dL (ref 0.50–1.10)
GFR calc Af Amer: 61 mL/min — ABNORMAL LOW (ref >90)
Sodium: 130 mEq/L — ABNORMAL LOW (ref 135–145)

## 2011-01-14 LAB — GLUCOSE, CAPILLARY: Glucose-Capillary: 236 mg/dL — ABNORMAL HIGH (ref 70–99)

## 2011-01-14 LAB — CBC
Platelets: 349 10*3/uL (ref 150–400)
RBC: 3.62 MIL/uL — ABNORMAL LOW (ref 3.87–5.11)
RDW: 13.7 % (ref 11.5–15.5)
WBC: 8 10*3/uL (ref 4.0–10.5)

## 2011-01-14 MED ORDER — CETIRIZINE HCL 10 MG PO TABS
10.0000 mg | ORAL_TABLET | Freq: Every day | ORAL | Status: DC
  Filled 2011-01-14: qty 1

## 2011-01-14 MED ORDER — SODIUM CHLORIDE 0.9 % IJ SOLN
INTRAMUSCULAR | Status: AC
  Administered 2011-01-14: 3 mL
  Filled 2011-01-14: qty 3

## 2011-01-14 MED ORDER — SODIUM CHLORIDE 0.9 % IV SOLN
INTRAVENOUS | Status: DC
  Administered 2011-01-14: 10:00:00 via INTRAVENOUS
  Administered 2011-01-15: 90 mL via INTRAVENOUS
  Administered 2011-01-15: 06:00:00 via INTRAVENOUS

## 2011-01-14 MED ORDER — ALBUTEROL SULFATE (5 MG/ML) 0.5% IN NEBU
2.5000 mg | INHALATION_SOLUTION | Freq: Four times a day (QID) | RESPIRATORY_TRACT | Status: DC
  Administered 2011-01-14 – 2011-01-16 (×8): 2.5 mg via RESPIRATORY_TRACT
  Filled 2011-01-14 (×8): qty 0.5

## 2011-01-14 MED ORDER — ENOXAPARIN SODIUM 80 MG/0.8ML ~~LOC~~ SOLN
0.5000 mg/kg | Freq: Every day | SUBCUTANEOUS | Status: DC
  Administered 2011-01-14 – 2011-01-15 (×2): 75 mg via SUBCUTANEOUS
  Filled 2011-01-14 (×2): qty 0.8

## 2011-01-14 MED ORDER — NON FORMULARY: 5.0000 mg | Freq: Every day | Status: DC

## 2011-01-14 MED ORDER — SODIUM CHLORIDE 0.9 % IJ SOLN
INTRAMUSCULAR | Status: AC
  Filled 2011-01-14: qty 10

## 2011-01-14 MED ORDER — CETIRIZINE HCL 10 MG PO TABS
10.0000 mg | ORAL_TABLET | Freq: Every day | ORAL | Status: DC
  Administered 2011-01-15 – 2011-01-16 (×2): 10 mg via ORAL
  Filled 2011-01-14 (×4): qty 1

## 2011-01-14 NOTE — Progress Notes (Signed)
Chart reviewed.  Subjective: Doesn't feel much better. Still weak and dizzy with standing. Cough has been mild. Does feel dyspneic on exertion. Has been requiring her rescue inhaler more frequently at home than usual. Appetite has been poor. Slightly nauseated.  Objective: Vital signs in last 24 hours: Filed Vitals:   01/14/11 0220 01/14/11 0233 01/14/11 0508 01/14/11 0915  BP:   97/58 90/55  Pulse:   79 80  Temp:   97.8 F (36.6 C) 98.6 F (37 C)  TempSrc:   Oral Oral  Resp:   16 18  Height:      Weight:      SpO2: 76% 91% 86% 92%   Weight change:  No intake or output data in the 24 hours ending 01/14/11 0935  General: Nontoxic appearing. Watching TV. Comfortable. No respiratory distress. Lungs diminished throughout with prolonged expiratory phase. No rhonchi rales or wheezing. Cardiovascular regular rate rhythm without murmurs gallops rubs Abdomen obese soft nontender Extremities no clubbing cyanosis or edema  Lab Results: Basic Metabolic Panel:  Lab 01/14/11 1610 01/13/11 2207  NA 130* 129*  K 4.1 4.1  CL 93* 92*  CO2 28 27  GLUCOSE 203* 196*  BUN 13 12  CREATININE 1.06 0.93  CALCIUM 8.7 8.9  MG -- --  PHOS -- --   Liver Function Tests:  Lab 01/13/11 2207  AST 14  ALT 11  ALKPHOS 95  BILITOT 0.6  PROT 6.3  ALBUMIN 2.7*   No results found for this basename: LIPASE:2,AMYLASE:2 in the last 168 hours No results found for this basename: AMMONIA:2 in the last 168 hours CBC:  Lab 01/14/11 0513 01/13/11 1913  WBC 8.0 7.2  NEUTROABS -- 4.4  HGB 11.3* 11.7*  HCT 34.0* 34.7*  MCV 93.9 93.3  PLT 349 311   Cardiac Enzymes: No results found for this basename: CKTOTAL:3,CKMB:3,CKMBINDEX:3,TROPONINI:3 in the last 168 hours BNP: No results found for this basename: POCBNP:3 in the last 168 hours D-Dimer: No results found for this basename: DDIMER:2 in the last 168 hours CBG:  Lab 01/13/11 2230  GLUCAP 193*  Urinalysis: Trace ketones. Specific gravity  1.025. Trace protein. Negative nitrite negative leukocyte esterase.  Micro Results: No results found for this or any previous visit (from the past 240 hour(s)). Studies/Results: Dg Chest 2 View  01/13/2011  *RADIOLOGY REPORT*  Clinical Data: Cough.  Shortness of left.  Generalized weakness.  CHEST - 2 VIEW 01/13/2011:  Comparison: Two-view chest x-ray 10/12/2004 and 08/19/2003 [facility].  Findings: Cardiac silhouette mildly enlarged, having increased in size in the interval.  Thoracic aorta mildly tortuous, unchanged. Hilar and mediastinal contours otherwise unremarkable.  Patchy airspace opacities in the right lower lobe.  Lungs otherwise clear. Mild pulmonary venous hypertension without overt edema. Compression fracture of what I believe is the T8 vertebral body, new since the prior examination.  IMPRESSION: Right lower lobe pneumonia.  Mild cardiomegaly with interval increase in heart size since 2006.  Original Report Authenticated By: Arnell Sieving, M.D.   Scheduled Meds:   . sodium chloride   Intravenous Once  . albuterol  2 puff Inhalation Once  . albuterol  2.5 mg Nebulization QID  . cyclobenzaprine  10 mg Oral QHS  . DULoxetine  60 mg Oral BID  . enoxaparin  40 mg Subcutaneous Q24H  . Fluticasone-Salmeterol  1 puff Inhalation Q12H  . folic acid  2 mg Oral Daily  . insulin aspart  0-15 Units Subcutaneous TID WC  . insulin aspart  0-5 Units Subcutaneous QHS  . insulin glargine  80 Units Subcutaneous QHS  . levothyroxine  137 mcg Oral Daily  . loratadine  10 mg Oral Daily  . montelukast  10 mg Oral Daily  . moxifloxacin  400 mg Intravenous Once  . moxifloxacin  400 mg Intravenous Q24H  . oxyCODONE  5 mg Oral TID  . sodium chloride      . zolpidem  10 mg Oral QHS  . DISCONTD: albuterol  2.5 mg Nebulization Q6H  . DISCONTD: enoxaparin  1 mg/kg Subcutaneous Q12H  . DISCONTD: enoxaparin  1 mg/kg Subcutaneous Q12H  . DISCONTD: enoxaparin  40 mg Subcutaneous Q24H  .  DISCONTD: etanercept  50 mg Subcutaneous Weekly  . DISCONTD: lisinopril  30 mg Oral Daily   Continuous Infusions:   . sodium chloride    . DISCONTD: sodium chloride 125 mL/hr at 01/13/11 1945  . DISCONTD: sodium chloride 100 mL/hr at 01/13/11 2205   PRN Meds:.acetaminophen, albuterol, alum & mag hydroxide-simeth, benzonatate, guaiFENesin, HYDROmorphone, ondansetron (ZOFRAN) IV, ondansetron, oxyCODONE, senna, DISCONTD: acetaminophen, DISCONTD: acetaminophen, DISCONTD: acetaminophen, DISCONTD: albuterol, DISCONTD: alum & mag hydroxide-simeth, DISCONTD: HYDROmorphone, DISCONTD: ondansetron (ZOFRAN) IV, DISCONTD: ondansetron, DISCONTD: oxyCODONE, DISCONTD: senna Assessment/Plan: Principal Problem:  *Pneumonia Active Problems:  HTN (hypertension), benign  DM type 2 (diabetes mellitus, type 2)  Psoriatic arthritis  Thyroiditis, chronic  Asthma  Hyponatremia  Weakness generalized  Anemia  Hyperglycemia  Morbid obesity  Patient's blood pressure is borderline low and she feels dizzy and weak. I will continue saline, IV antibiotics, oxygen, bronchodilators. Her hyponatremia is most likely related to dehydration. However I will check a TSH. Her diabetes is poorly controlled. I will monitor her blood glucoses and check hemoglobin A1c. Her regimen may need to be adjusted. Hold Enbrel and methotrexate while recovering from pneumonia. Changed to inpatient status.   LOS: 1 day   Brannon Levene L 01/14/2011, 9:35 AM

## 2011-01-14 NOTE — Progress Notes (Signed)
Pt was placed on o2 3lpm after first hhn on 3rd floor , sats increased to 92

## 2011-01-15 LAB — BASIC METABOLIC PANEL
GFR calc Af Amer: 58 mL/min — ABNORMAL LOW (ref >90)
GFR calc non Af Amer: 50 mL/min — ABNORMAL LOW (ref >90)
Potassium: 4.3 mEq/L (ref 3.5–5.1)
Sodium: 132 mEq/L — ABNORMAL LOW (ref 135–145)

## 2011-01-15 LAB — GLUCOSE, CAPILLARY: Glucose-Capillary: 90 mg/dL (ref 70–99)

## 2011-01-15 NOTE — Progress Notes (Signed)
Chart reviewed.  Subjective: The patient feels better today. She is less dizzy when she stands up now. Objective: Vital signs in last 24 hours: Filed Vitals:   01/14/11 2149 01/15/11 0439 01/15/11 0444 01/15/11 0748  BP:  99/63    Pulse:  87    Temp:  98.2 F (36.8 C)    TempSrc:  Oral    Resp:  24    Height:      Weight:      SpO2: 86% 85% 91% 94%   Weight change:   Intake/Output Summary (Last 24 hours) at 01/15/11 1107 Last data filed at 01/15/11 0445  Gross per 24 hour  Intake 761.67 ml  Output    450 ml  Net 311.67 ml    General: No respiratory distress. Lungs: Decreased breath sounds in the bases but clear anteriorly. No audible wheezes or crackles. Cardiovascular regular rate rhythm without murmurs gallops rubs Abdomen obese soft nontender Extremities no clubbing cyanosis or edema  Lab Results: Basic Metabolic Panel:  Lab 01/15/11 4098 01/14/11 0513  NA 132* 130*  K 4.3 4.1  CL 96 93*  CO2 30 28  GLUCOSE 126* 203*  BUN 14 13  CREATININE 1.10 1.06  CALCIUM 8.8 8.7  MG -- --  PHOS -- --   Liver Function Tests:  Lab 01/13/11 2207  AST 14  ALT 11  ALKPHOS 95  BILITOT 0.6  PROT 6.3  ALBUMIN 2.7*   No results found for this basename: LIPASE:2,AMYLASE:2 in the last 168 hours No results found for this basename: AMMONIA:2 in the last 168 hours CBC:  Lab 01/14/11 0513 01/13/11 1913  WBC 8.0 7.2  NEUTROABS -- 4.4  HGB 11.3* 11.7*  HCT 34.0* 34.7*  MCV 93.9 93.3  PLT 349 311   Cardiac Enzymes: No results found for this basename: CKTOTAL:3,CKMB:3,CKMBINDEX:3,TROPONINI:3 in the last 168 hours BNP: No results found for this basename: POCBNP:3 in the last 168 hours D-Dimer: No results found for this basename: DDIMER:2 in the last 168 hours CBG:  Lab 01/15/11 0740 01/14/11 2108 01/14/11 1625 01/14/11 1115 01/13/11 2230  GLUCAP 77 220* 203* 236* 193*  Urinalysis: Trace ketones. Specific gravity 1.025. Trace protein. Negative nitrite negative  leukocyte esterase.  Micro Results: No results found for this or any previous visit (from the past 240 hour(s)). Studies/Results: Dg Chest 2 View  01/13/2011  *RADIOLOGY REPORT*  Clinical Data: Cough.  Shortness of left.  Generalized weakness.  CHEST - 2 VIEW 01/13/2011:  Comparison: Two-view chest x-ray 10/12/2004 and 08/19/2003 [facility].  Findings: Cardiac silhouette mildly enlarged, having increased in size in the interval.  Thoracic aorta mildly tortuous, unchanged. Hilar and mediastinal contours otherwise unremarkable.  Patchy airspace opacities in the right lower lobe.  Lungs otherwise clear. Mild pulmonary venous hypertension without overt edema. Compression fracture of what I believe is the T8 vertebral body, new since the prior examination.  IMPRESSION: Right lower lobe pneumonia.  Mild cardiomegaly with interval increase in heart size since 2006.  Original Report Authenticated By: Arnell Sieving, M.D.   Scheduled Meds:    . albuterol  2.5 mg Nebulization QID  . cetirizine  10 mg Oral Daily  . cyclobenzaprine  10 mg Oral QHS  . DULoxetine  60 mg Oral BID  . enoxaparin (LOVENOX) injection  0.5 mg/kg Subcutaneous Daily  . Fluticasone-Salmeterol  1 puff Inhalation Q12H  . folic acid  2 mg Oral Daily  . insulin aspart  0-15 Units Subcutaneous TID WC  .  insulin aspart  0-5 Units Subcutaneous QHS  . insulin glargine  80 Units Subcutaneous QHS  . levothyroxine  137 mcg Oral Daily  . montelukast  10 mg Oral Daily  . moxifloxacin  400 mg Intravenous Q24H  . oxyCODONE  5 mg Oral TID  . sodium chloride      . zolpidem  10 mg Oral QHS  . DISCONTD: cetirizine  10 mg Oral Daily  . DISCONTD: loratadine  10 mg Oral Daily  . DISCONTD: NON FORMULARY 5 mg  5 mg Oral Daily   Continuous Infusions:    . sodium chloride 100 mL/hr at 01/15/11 0547   PRN Meds:.acetaminophen, albuterol, alum & mag hydroxide-simeth, benzonatate, guaiFENesin, HYDROmorphone, ondansetron (ZOFRAN) IV,  ondansetron, oxyCODONE, senna Assessment/Plan: Principal Problem:  *Pneumonia Active Problems:  Hyperglycemia  Weakness generalized  DM type 2 (diabetes mellitus, type 2)  HTN (hypertension), benign  Psoriatic arthritis  Anemia  Thyroiditis, chronic  Morbid obesity  Asthma  Hyponatremia  The patient is symptomatically improved. Her serum sodium is improving on IV fluid hydration. She is less dizzy. Her TSH is within normal limits. Her blood pressure is still borderline low. Lisinopril is on hold. Her capillary blood glucose is trending downward. We will hold off on increasing the sliding scale NovoLog for now.  Hold Enbrel and methotrexate while recovering from pneumonia. The patient was encouraged to ambulate. Possible discharge tomorrow.   LOS: 2 days   Ashley Caldwell 01/15/2011, 11:07 AM

## 2011-01-16 ENCOUNTER — Encounter (HOSPITAL_COMMUNITY): Payer: Self-pay | Admitting: Internal Medicine

## 2011-01-16 DIAGNOSIS — M4850XA Collapsed vertebra, not elsewhere classified, site unspecified, initial encounter for fracture: Secondary | ICD-10-CM | POA: Diagnosis present

## 2011-01-16 DIAGNOSIS — E11649 Type 2 diabetes mellitus with hypoglycemia without coma: Secondary | ICD-10-CM | POA: Diagnosis not present

## 2011-01-16 HISTORY — DX: Collapsed vertebra, not elsewhere classified, site unspecified, initial encounter for fracture: M48.50XA

## 2011-01-16 HISTORY — DX: Type 2 diabetes mellitus with hypoglycemia without coma: E11.649

## 2011-01-16 LAB — GLUCOSE, CAPILLARY
Glucose-Capillary: 53 mg/dL — ABNORMAL LOW (ref 70–99)
Glucose-Capillary: 105 mg/dL — ABNORMAL HIGH (ref 70–99)

## 2011-01-16 LAB — URINE CULTURE

## 2011-01-16 LAB — BASIC METABOLIC PANEL
Calcium: 9 mg/dL (ref 8.4–10.5)
GFR calc non Af Amer: 59 mL/min — ABNORMAL LOW (ref >90)
Glucose, Bld: 48 mg/dL — ABNORMAL LOW (ref 70–99)
Sodium: 138 mEq/L (ref 135–145)

## 2011-01-16 LAB — CBC
MCH: 31.1 pg (ref 26.0–34.0)
MCHC: 33.1 g/dL (ref 30.0–36.0)
Platelets: 396 10*3/uL (ref 150–400)
RBC: 3.63 MIL/uL — ABNORMAL LOW (ref 3.87–5.11)
RDW: 14 % (ref 11.5–15.5)

## 2011-01-16 MED ORDER — MOXIFLOXACIN HCL 400 MG PO TABS: 400.0000 mg | ORAL_TABLET | Freq: Every day | ORAL | Status: AC

## 2011-01-16 MED ORDER — INSULIN GLARGINE 100 UNIT/ML ~~LOC~~ SOLN: 80.0000 [IU] | Freq: Every day | SUBCUTANEOUS | Status: DC

## 2011-01-16 MED ORDER — BENZONATATE 100 MG PO CAPS: 100.0000 mg | ORAL_CAPSULE | Freq: Three times a day (TID) | ORAL | Status: AC | PRN

## 2011-01-16 MED ORDER — GUAIFENESIN ER 600 MG PO TB12: 600.0000 mg | ORAL_TABLET | Freq: Two times a day (BID) | ORAL | Status: AC | PRN

## 2011-01-16 MED ORDER — ALBUTEROL SULFATE HFA 108 (90 BASE) MCG/ACT IN AERS
2.0000 | INHALATION_SPRAY | RESPIRATORY_TRACT | Status: DC | PRN
  Administered 2011-01-16: 2 via RESPIRATORY_TRACT
  Filled 2011-01-16: qty 6.7

## 2011-01-16 MED ORDER — ALBUTEROL SULFATE HFA 108 (90 BASE) MCG/ACT IN AERS: 2.0000 | INHALATION_SPRAY | RESPIRATORY_TRACT | Status: AC | PRN

## 2011-01-16 MED ORDER — INSULIN GLARGINE 100 UNIT/ML ~~LOC~~ SOLN: 90.0000 [IU] | Freq: Every day | SUBCUTANEOUS | Status: DC

## 2011-01-16 NOTE — Progress Notes (Signed)
Inpatient Diabetes Program Recommendations  AACE/ADA: New Consensus Statement on Inpatient Glycemic Control (2009)  Target Ranges:  Prepandial:   less than 140 mg/dL      Peak postprandial:   less than 180 mg/dL (1-2 hours)      Critically ill patients:  140 - 180 mg/dL   Reason for Visit: 16/10  Hypoglycemia  48 mg/dL  Inpatient Diabetes Program Recommendations Insulin - Basal: Decrease Lantus to 60 units qhs

## 2011-01-16 NOTE — Progress Notes (Signed)
Patient had a low blood sugar of 53 this am. Lab draw had already been done just prior. Patient was alert and oriented at the time.  Patient blood sugar recheck at 61 after being given orange juice, snack and cereal food.  Blood sugar rechecked at 7:00 am it was 105.  Patient ate dinner last night and had two snacks prior to bedtime.

## 2011-01-16 NOTE — Progress Notes (Signed)
Patient d/c home with family Left floor via wheelchair accompanied by staff No c/o pain at d/c Verbalized understanding on d/c instructions, rx's, and follow up appts Ashley Caldwell, Ashley Caldwell

## 2011-01-16 NOTE — Progress Notes (Signed)
[facility] Chart Review Completed  

## 2011-01-16 NOTE — Discharge Summary (Deleted)
Physician Discharge Summary  Ashley Caldwell MRN: [id number] DOB/AGE: [birth year] 70 y.o.  PCP: Dr. Milford Cage.   Admit date: 01/13/2011 Discharge date: 01/16/2011  Discharge Diagnoses:  1. Community-acquired pneumonia. 2. Type 2 diabetes mellitus. The patient's hemoglobin A1c was 8.5. 3. Asymptomatic hypoglycemic episode. 4. Relative hypotension, secondary to hypovolemia. Resolved. 5. Hyponatremia, secondary to hypovolemia. Resolved. 6. Anemia, secondary to the dilutional effects of IV fluids. 7. Chronic thyroiditis/hypothyroidism. Her TSH was within normal limits at 3.2. 8. Generalized weakness, multifactorial. Resolved. 9. Chronic asthma. 10. History of hypertension. 11. History of psoriatic arthritis. 12. Morbid obesity. 13. Probable T8 compression fracture.    Current Discharge Medication List    START taking these medications   Details  albuterol (PROVENTIL HFA;VENTOLIN HFA) 108 (90 BASE) MCG/ACT inhaler Inhale 2 puffs into the lungs every 4 (four) hours as needed for wheezing or shortness of breath. Qty: 1 Inhaler, Refills: 1    benzonatate (TESSALON) 100 MG capsule Take 1 capsule (100 mg total) by mouth 3 (three) times daily as needed for cough. Qty: 20 capsule, Refills: 0    guaiFENesin (MUCINEX) 600 MG 12 hr tablet Take 1 tablet (600 mg total) by mouth 2 (two) times daily as needed for congestion.    moxifloxacin (AVELOX) 400 MG tablet Take 1 tablet (400 mg total) by mouth daily. Qty: 4 tablet, Refills: 0      CONTINUE these medications which have CHANGED   Details  insulin glargine (LANTUS) 100 UNIT/ML injection Inject 80 Units into the skin at bedtime. Qty: 10 mL      CONTINUE these medications which have NOT CHANGED   Details  cetirizine (ZYRTEC) 10 MG tablet Take 10 mg by mouth daily.      cyclobenzaprine (FLEXERIL) 10 MG tablet Take 10 mg by mouth at bedtime.      DULoxetine (CYMBALTA) 60 MG capsule Take 60 mg by mouth 2 (two) times daily.        etanercept (ENBREL) 50 MG/ML injection Inject 50 mg into the skin once a week. Give on Saturday!! Patient has not given injection in 2 weeks!!!    Fluticasone-Salmeterol (ADVAIR) 100-50 MCG/DOSE AEPB Inhale 1 puff into the lungs every 12 (twelve) hours.      folic acid (FOLVITE) 1 MG tablet Take 2 mg by mouth daily.      insulin lispro (HUMALOG) 100 UNIT/ML injection Inject 20 Units into the skin 3 (three) times daily before meals.  Do not give injection if your blood sugar is below 120.    levothyroxine (SYNTHROID, LEVOTHROID) 137 MCG tablet Take 137 mcg by mouth daily.      lisinopril (PRINIVIL,ZESTRIL) 30 MG tablet Take 30 mg by mouth daily.      methotrexate (RHEUMATREX) 2.5 MG tablet Take 20 mg by mouth once a week. Caution:Chemotherapy. Protect from light.Patient takes on Saturday!!! Patient states she has Not taking in 2 weeks!!     montelukast (SINGULAIR) 10 MG tablet Take 10 mg by mouth daily.      oxyCODONE (OXY IR/ROXICODONE) 5 MG immediate release tablet Take 5 mg by mouth 3 (three) times daily.      zolpidem (AMBIEN) 10 MG tablet Take 10 mg by mouth at bedtime.          Discharge Condition: Improved and stable.  Disposition: Home.   Consults: None.   Significant Diagnostic Studies: Dg Chest 2 View  01/13/2011  *RADIOLOGY REPORT*  Clinical Data: Cough.  Shortness of left.  Generalized weakness.  CHEST -  2 VIEW 01/13/2011:  Comparison: Two-view chest x-ray 10/12/2004 and 08/19/2003 [facility].  Findings: Cardiac silhouette mildly enlarged, having increased in size in the interval.  Thoracic aorta mildly tortuous, unchanged. Hilar and mediastinal contours otherwise unremarkable.  Patchy airspace opacities in the right lower lobe.  Lungs otherwise clear. Mild pulmonary venous hypertension without overt edema. Compression fracture of what I believe is the T8 vertebral body, new since the prior examination.  IMPRESSION: Right lower lobe pneumonia.  Mild  cardiomegaly with interval increase in heart size since 2006.  Original Report Authenticated By: Arnell Sieving, M.D.     Microbiology: Recent Results (from the past 240 hour(s))  URINE CULTURE     Status: Normal (Preliminary result)   Collection Time   01/13/11  6:20 PM      Component Value Range Status Comment   Specimen Description URINE, CLEAN CATCH   Final    Special Requests NONE   Final    Setup Time [id number]   Final    Colony Count PENDING   Incomplete    Culture Culture reincubated for better growth   Final    Report Status PENDING   Incomplete      Labs: Results for orders placed during the hospital encounter of 01/13/11 (from the past 48 hour(s))  GLUCOSE, CAPILLARY     Status: Abnormal   Collection Time   01/14/11  4:25 PM      Component Value Range Comment   Glucose-Capillary 203 (*) 70 - 99 (mg/dL)    Comment 1 Notify RN      Comment 2 Documented in Chart     GLUCOSE, CAPILLARY     Status: Abnormal   Collection Time   01/14/11  9:08 PM      Component Value Range Comment   Glucose-Capillary 220 (*) 70 - 99 (mg/dL)   BASIC METABOLIC PANEL     Status: Abnormal   Collection Time   01/15/11  5:07 AM      Component Value Range Comment   Sodium 132 (*) 135 - 145 (mEq/L)    Potassium 4.3  3.5 - 5.1 (mEq/L)    Chloride 96  96 - 112 (mEq/L)    CO2 30  19 - 32 (mEq/L)    Glucose, Bld 126 (*) 70 - 99 (mg/dL)    BUN 14  6 - 23 (mg/dL)    Creatinine, Ser 9.56  0.50 - 1.10 (mg/dL)    Calcium 8.8  8.4 - 10.5 (mg/dL)    GFR calc non Af Amer 50 (*) >90 (mL/min)    GFR calc Af Amer 58 (*) >90 (mL/min)   GLUCOSE, CAPILLARY     Status: Normal   Collection Time   01/15/11  7:40 AM      Component Value Range Comment   Glucose-Capillary 77  70 - 99 (mg/dL)    Comment 1 Notify RN      Comment 2 Documented in Chart     GLUCOSE, CAPILLARY     Status: Normal   Collection Time   01/15/11 11:53 AM      Component Value Range Comment   Glucose-Capillary 89  70 - 99  (mg/dL)    Comment 1 Notify RN      Comment 2 Documented in Chart     GLUCOSE, CAPILLARY     Status: Normal   Collection Time   01/15/11  4:44 PM      Component Value Range Comment   Glucose-Capillary 90  70 - 99 (mg/dL)    Comment 1 Notify RN      Comment 2 Documented in Chart     GLUCOSE, CAPILLARY     Status: Abnormal   Collection Time   01/15/11  8:57 PM      Component Value Range Comment   Glucose-Capillary 123 (*) 70 - 99 (mg/dL)    Comment 1 Notify RN     BASIC METABOLIC PANEL     Status: Abnormal   Collection Time   01/16/11  5:16 AM      Component Value Range Comment   Sodium 138  135 - 145 (mEq/L)    Potassium 4.0  3.5 - 5.1 (mEq/L)    Chloride 103  96 - 112 (mEq/L)    CO2 26  19 - 32 (mEq/L)    Glucose, Bld 48 (*) 70 - 99 (mg/dL)    BUN 10  6 - 23 (mg/dL)    Creatinine, Ser 1.61  0.50 - 1.10 (mg/dL)    Calcium 9.0  8.4 - 10.5 (mg/dL)    GFR calc non Af Amer 59 (*) >90 (mL/min)    GFR calc Af Amer 69 (*) >90 (mL/min)   CBC     Status: Abnormal   Collection Time   01/16/11  5:16 AM      Component Value Range Comment   WBC 7.9  4.0 - 10.5 (K/uL)    RBC 3.63 (*) 3.87 - 5.11 (MIL/uL)    Hemoglobin 11.3 (*) 12.0 - 15.0 (g/dL)    HCT 09.6 (*) 04.5 - 46.0 (%)    MCV 93.9  78.0 - 100.0 (fL)    MCH 31.1  26.0 - 34.0 (pg)    MCHC 33.1  30.0 - 36.0 (g/dL)    RDW 40.9  81.1 - 91.4 (%)    Platelets 396  150 - 400 (K/uL)   GLUCOSE, CAPILLARY     Status: Abnormal   Collection Time   01/16/11  5:57 AM      Component Value Range Comment   Glucose-Capillary 53 (*) 70 - 99 (mg/dL)   GLUCOSE, CAPILLARY     Status: Abnormal   Collection Time   01/16/11  6:20 AM      Component Value Range Comment   Glucose-Capillary 50 (*) 70 - 99 (mg/dL)   GLUCOSE, CAPILLARY     Status: Abnormal   Collection Time   01/16/11  6:21 AM      Component Value Range Comment   Glucose-Capillary 61 (*) 70 - 99 (mg/dL)   GLUCOSE, CAPILLARY     Status: Abnormal   Collection Time   01/16/11  7:02  AM      Component Value Range Comment   Glucose-Capillary 105 (*) 70 - 99 (mg/dL)      HPI : The patient is a 70 year old woman with a past medical history significant for chronic fatigue syndrome, psoriatic arthritis, hypothyroidism, and type 2 diabetes mellitus. She presented to the emergency department on 01/13/2011 with a chief complaint of cough, fevers, chills, and generalized weakness. In the emergency department, her chest x-ray revealed right lower lobe pneumonia. She was afebrile and hemodynamically stable. She was oxygenating 96% on room air. Her white blood cell count was within normal limits at 7.2. She was admitted for further evaluation and management.  HOSPITAL COURSE: The patient was started on IV Avelox for treatment. Albuterol nebulizations were started for bronchospasms. Mucinex and Tessalon Perles were added for symptomatic relief of coughing. IV fluid hydration  was started with normal saline. She was restarted on all of her chronic medications with the exception of lisinopril given that her blood pressure was on the lower end of normal. Because of the active infection, Enbrel and methotrexate were withheld during the hospitalization.   With IV fluid hydration, her serum sodium improved from 130 to 132. It normalized at 138 prior to discharge. She became less symptomatic. She felt better. Her capillary blood glucose improved over the course of the hospitalization on her usual dose of Lantus and less NovoLog. Toward the end of the hospital course, however, she had one episode of asymptomatic hypoglycemia. Her capillary blood glucose fell to 61 and her venous glucose fell to 48. She was treated accordingly. Her capillary blood glucose increased to the 120s. She was asymptomatic. Her hemoglobin A1c was noted to be 8.5. Initially the home dose of Lantus was going to be increased, however, in light of the hypoglycemia and a trend toward lower capillary blood glucose, she was advised to  maintain Lantus 80 units daily and her sliding scale NovoLog as previously ordered by her endocrinologist Dr. Lurene Shadow. I suspect some noncompliance at home. On less NovoLog and maintaining 80 units of Lantus, her capillary blood glucose progressively decreased. The decrease may have also been secondary to her relatively poor intake in the setting of an active infection in the hospital. She was advised to not take Humalog if her capillary blood glucose was less than 120 at home.  Her blood pressure improved to the 130s prior to discharge. Her hemoglobin fell slightly, secondary to the dilutional effects of IV fluids. Her oxygen saturations were maintained in the upper 90s on room air. She completed 4 days of IV Avelox therapy. She was discharged to home on 4 more days of oral therapy. She was advised to resume Enbrel and methotrexate.   Discharge Exam:  Blood pressure 115/71, pulse 80, temperature 98.5 F (36.9 C), temperature source Oral, resp. rate 18, height 5\' 8"  (1.727 m), weight 148.78 kg (328 lb), SpO2 92.00%.  Lungs: Clear to auscultation bilaterally. Heart: S1, S2, no murmurs rubs or gallops. Abdomen: Positive bowel sounds, soft, obese, nontender, nondistended. Extremities: No pedal edema.   Discharge Orders    Future Orders Please Complete By Expires   Diet - low sodium heart healthy and Carb-modified.     Increase activity slowly         Follow-up Information    Follow up with HALM,STEVEN J on 01/22/2011. (Appointment time 1:40)    Contact information:   [address] [place] [address] [phone]          Signed: Tyjanae Bartek 01/16/2011, 11:28 AM

## 2011-01-16 NOTE — Discharge Planning (Signed)
Note deleted by Elliot Cousin 01/16/2011 12:08 PM   Elliot Cousin Physician Deleted  D/C Summaries 01/16/2011 11:28 AM        Physician Discharge Summary   Ashley Caldwell MRN: [id number] DOB/AGE: 1940/09/02 70 y.o.   PCP: Dr. Milford Cage.     Admit date: 01/13/2011 Discharge date: 01/16/2011   Discharge Diagnoses:   1. Community-acquired pneumonia. 2. Type 2 diabetes mellitus. The patient's hemoglobin A1c was 8.5. 3. Asymptomatic hypoglycemic episode. 4. Relative hypotension, secondary to hypovolemia. Resolved. 5. Hyponatremia, secondary to hypovolemia. Resolved. 6. Anemia, secondary to the dilutional effects of IV fluids. 7. Chronic thyroiditis/hypothyroidism. Her TSH was within normal limits at 3.2. 8. Generalized weakness, multifactorial. Resolved. 9. Chronic asthma. 10. History of hypertension. 11. History of psoriatic arthritis. 12. Morbid obesity. 13. Probable T8 compression fracture.        Current Discharge Medication List      START taking these medications     Details   albuterol (PROVENTIL HFA;VENTOLIN HFA) 108 (90 BASE) MCG/ACT inhaler  Inhale 2 puffs into the lungs every 4 (four) hours as needed for wheezing or shortness of breath.  Qty: 1 Inhaler, Refills: 1      benzonatate (TESSALON) 100 MG capsule  Take 1 capsule (100 mg total) by mouth 3 (three) times daily as needed for cough.  Qty: 20 capsule, Refills: 0      guaiFENesin (MUCINEX) 600 MG 12 hr tablet  Take 1 tablet (600 mg total) by mouth 2 (two) times daily as needed for congestion.      moxifloxacin (AVELOX) 400 MG tablet  Take 1 tablet (400 mg total) by mouth daily.  Qty: 4 tablet, Refills: 0         CONTINUE these medications which have CHANGED     Details   insulin glargine (LANTUS) 100 UNIT/ML injection  Inject 80 Units into the skin at bedtime.  Qty: 10 mL         CONTINUE these medications which have NOT CHANGED     Details   cetirizine (ZYRTEC) 10 MG tablet  Take 10 mg by mouth  daily.        cyclobenzaprine (FLEXERIL) 10 MG tablet  Take 10 mg by mouth at bedtime.        DULoxetine (CYMBALTA) 60 MG capsule  Take 60 mg by mouth 2 (two) times daily.        etanercept (ENBREL) 50 MG/ML injection  Inject 50 mg into the skin once a week. Give on Saturday!! Patient has not given injection in 2 weeks!!!      Fluticasone-Salmeterol (ADVAIR) 100-50 MCG/DOSE AEPB  Inhale 1 puff into the lungs every 12 (twelve) hours.        folic acid (FOLVITE) 1 MG tablet  Take 2 mg by mouth daily.        insulin lispro (HUMALOG) 100 UNIT/ML injection  Inject 20 Units into the skin 3 (three) times daily before meals.  Do not give injection if your blood sugar is below 120.      levothyroxine (SYNTHROID, LEVOTHROID) 137 MCG tablet  Take 137 mcg by mouth daily.        lisinopril (PRINIVIL,ZESTRIL) 30 MG tablet  Take 30 mg by mouth daily.        methotrexate (RHEUMATREX) 2.5 MG tablet  Take 20 mg by mouth once a week. Caution:Chemotherapy. Protect from light.Patient takes on Saturday!!! Patient states she has Not taking in 2 weeks!!       montelukast (SINGULAIR) 10  MG tablet  Take 10 mg by mouth daily.        oxyCODONE (OXY IR/ROXICODONE) 5 MG immediate release tablet  Take 5 mg by mouth 3 (three) times daily.        zolpidem (AMBIEN) 10 MG tablet  Take 10 mg by mouth at bedtime.                Discharge Condition: Improved and stable.   Disposition: Home.     Consults: None.     Significant Diagnostic Studies: Dg Chest 2 View   01/13/2011  *RADIOLOGY REPORT*  Clinical Data: Cough.  Shortness of left.  Generalized weakness.  CHEST - 2 VIEW 01/13/2011:  Comparison: Two-view chest x-ray 10/12/2004 and 08/19/2003 [facility].  Findings: Cardiac silhouette mildly enlarged, having increased in size in the interval.  Thoracic aorta mildly tortuous, unchanged. Hilar and mediastinal contours otherwise unremarkable.  Patchy airspace opacities in the right lower lobe.   Lungs otherwise clear. Mild pulmonary venous hypertension without overt edema. Compression fracture of what I believe is the T8 vertebral body, new since the prior examination.  IMPRESSION: Right lower lobe pneumonia.  Mild cardiomegaly with interval increase in heart size since 2006.  Original Report Authenticated By: Arnell Sieving, M.D.         Microbiology: Recent Results (from the past 240 hour(s))   URINE CULTURE     Status: Normal (Preliminary result)     Collection Time     01/13/11  6:20 PM       Component  Value  Range  Status  Comment     Specimen Description  URINE, CLEAN CATCH     Final       Special Requests  NONE     Final       Setup Time  [id number]     Final       Colony Count  PENDING     Incomplete       Culture  Culture reincubated for better growth     Final       Report Status  PENDING     Incomplete        Labs: Results for orders placed during the hospital encounter of 01/13/11 (from the past 48 hour(s))   GLUCOSE, CAPILLARY     Status: Abnormal     Collection Time     01/14/11  4:25 PM       Component  Value  Range  Comment     Glucose-Capillary  203 (*)  70 - 99 (mg/dL)       Comment 1  Notify RN          Comment 2  Documented in Chart        GLUCOSE, CAPILLARY     Status: Abnormal     Collection Time     01/14/11  9:08 PM       Component  Value  Range  Comment     Glucose-Capillary  220 (*)  70 - 99 (mg/dL)     BASIC METABOLIC PANEL     Status: Abnormal     Collection Time     01/15/11  5:07 AM       Component  Value  Range  Comment     Sodium  132 (*)  135 - 145 (mEq/L)       Potassium  4.3   3.5 - 5.1 (mEq/L)       Chloride  96  96 - 112 (mEq/L)       CO2  30   19 - 32 (mEq/L)       Glucose, Bld  126 (*)  70 - 99 (mg/dL)       BUN  14   6 - 23 (mg/dL)       Creatinine, Ser  1.10   0.50 - 1.10 (mg/dL)       Calcium  8.8   8.4 - 10.5 (mg/dL)       GFR calc non Af Amer  50 (*)  >90 (mL/min)       GFR calc Af Amer  58 (*)  >90 (mL/min)      GLUCOSE, CAPILLARY     Status: Normal     Collection Time     01/15/11  7:40 AM       Component  Value  Range  Comment     Glucose-Capillary  77   70 - 99 (mg/dL)       Comment 1  Notify RN          Comment 2  Documented in Chart        GLUCOSE, CAPILLARY     Status: Normal     Collection Time     01/15/11 11:53 AM       Component  Value  Range  Comment     Glucose-Capillary  89   70 - 99 (mg/dL)       Comment 1  Notify RN          Comment 2  Documented in Chart        GLUCOSE, CAPILLARY     Status: Normal     Collection Time     01/15/11  4:44 PM       Component  Value  Range  Comment     Glucose-Capillary  90   70 - 99 (mg/dL)       Comment 1  Notify RN          Comment 2  Documented in Chart        GLUCOSE, CAPILLARY     Status: Abnormal     Collection Time     01/15/11  8:57 PM       Component  Value  Range  Comment     Glucose-Capillary  123 (*)  70 - 99 (mg/dL)       Comment 1  Notify RN        BASIC METABOLIC PANEL     Status: Abnormal     Collection Time     01/16/11  5:16 AM       Component  Value  Range  Comment     Sodium  138   135 - 145 (mEq/L)       Potassium  4.0   3.5 - 5.1 (mEq/L)       Chloride  103   96 - 112 (mEq/L)       CO2  26   19 - 32 (mEq/L)       Glucose, Bld  48 (*)  70 - 99 (mg/dL)       BUN  10   6 - 23 (mg/dL)       Creatinine, Ser  0.96   0.50 - 1.10 (mg/dL)       Calcium  9.0   8.4 - 10.5 (mg/dL)       GFR calc non Af Amer  59 (*)  >90 (mL/min)  GFR calc Af Amer  69 (*)  >90 (mL/min)     CBC     Status: Abnormal     Collection Time     01/16/11  5:16 AM       Component  Value  Range  Comment     WBC  7.9   4.0 - 10.5 (K/uL)       RBC  3.63 (*)  3.87 - 5.11 (MIL/uL)       Hemoglobin  11.3 (*)  12.0 - 15.0 (g/dL)       HCT  91.4 (*)  78.2 - 46.0 (%)       MCV  93.9   78.0 - 100.0 (fL)       MCH  31.1   26.0 - 34.0 (pg)       MCHC  33.1   30.0 - 36.0 (g/dL)       RDW  95.6   21.3 - 15.5 (%)       Platelets  396   150 - 400  (K/uL)     GLUCOSE, CAPILLARY     Status: Abnormal     Collection Time     01/16/11  5:57 AM       Component  Value  Range  Comment     Glucose-Capillary  53 (*)  70 - 99 (mg/dL)     GLUCOSE, CAPILLARY     Status: Abnormal     Collection Time     01/16/11  6:20 AM       Component  Value  Range  Comment     Glucose-Capillary  50 (*)  70 - 99 (mg/dL)     GLUCOSE, CAPILLARY     Status: Abnormal     Collection Time     01/16/11  6:21 AM       Component  Value  Range  Comment     Glucose-Capillary  61 (*)  70 - 99 (mg/dL)     GLUCOSE, CAPILLARY     Status: Abnormal     Collection Time     01/16/11  7:02 AM       Component  Value  Range  Comment     Glucose-Capillary  105 (*)  70 - 99 (mg/dL)          HPI : The patient is a 70 year old woman with a past medical history significant for chronic fatigue syndrome, psoriatic arthritis, hypothyroidism, and type 2 diabetes mellitus. She presented to the emergency department on 01/13/2011 with a chief complaint of cough, fevers, chills, and generalized weakness. In the emergency department, her chest x-ray revealed right lower lobe pneumonia. She was afebrile and hemodynamically stable. She was oxygenating 96% on room air. Her white blood cell count was within normal limits at 7.2. She was admitted for further evaluation and management.   HOSPITAL COURSE: The patient was started on IV Avelox for treatment. Albuterol nebulizations were started for bronchospasms. Mucinex and Tessalon Perles were added for symptomatic relief of coughing. IV fluid hydration was started with normal saline. She was restarted on all of her chronic medications with the exception of lisinopril given that her blood pressure was on the lower end of normal. Because of the active infection, Enbrel and methotrexate were withheld during the hospitalization.     With IV fluid hydration, her serum sodium improved from 130 to 132. It normalized at 138 prior to discharge. She  became less symptomatic. She felt better. Her capillary blood glucose  improved over the course of the hospitalization on her usual dose of Lantus and less NovoLog. Toward the end of the hospital course, however, she had one episode of asymptomatic hypoglycemia. Her capillary blood glucose fell to 61 and her venous glucose fell to 48. She was treated accordingly. Her capillary blood glucose increased to the 120s. She was asymptomatic. Her hemoglobin A1c was noted to be 8.5. Initially the home dose of Lantus was going to be increased, however, in light of the hypoglycemia and a trend toward lower capillary blood glucose, she was advised to maintain Lantus 80 units daily and her sliding scale NovoLog as previously ordered by her endocrinologist Dr. Lurene Shadow. I suspect some noncompliance at home. On less NovoLog and maintaining 80 units of Lantus, her capillary blood glucose progressively decreased. The decrease may have also been secondary to her relatively poor intake in the setting of an active infection in the hospital. She was advised to not take Humalog if her capillary blood glucose was less than 120 at home.   Her blood pressure improved to the 130s prior to discharge. Her hemoglobin fell slightly, secondary to the dilutional effects of IV fluids. Her oxygen saturations were maintained in the upper 90s on room air. She completed 4 days of IV Avelox therapy. She was discharged to home on 4 more days of oral therapy. She was advised to resume Enbrel and methotrexate.     Discharge Exam:   Blood pressure 115/71, pulse 80, temperature 98.5 F (36.9 C), temperature source Oral, resp. rate 18, height 5\' 8"  (1.727 m), weight 148.78 kg (328 lb), SpO2 92.00%.   Lungs: Clear to auscultation bilaterally. Heart: S1, S2, no murmurs rubs or gallops. Abdomen: Positive bowel sounds, soft, obese, nontender, nondistended. Extremities: No pedal edema.      Discharge Orders      Future Orders  Please Complete  By  Expires     Diet - low sodium heart healthy and Carb-modified.         Increase activity slowly                 Follow-up Information      Follow up with HALM,Ashley J on 01/22/2011. (Appointment time 1:40)      Contact information:     [address]  [place] [address] [phone]               Signed: Jaidence Caldwell 01/16/2011, 11:28 AM

## 2015-03-21 DIAGNOSIS — R05 Cough: Secondary | ICD-10-CM | POA: Diagnosis not present

## 2015-03-21 DIAGNOSIS — J00 Acute nasopharyngitis [common cold]: Secondary | ICD-10-CM | POA: Diagnosis not present

## 2015-04-08 DIAGNOSIS — M797 Fibromyalgia: Secondary | ICD-10-CM | POA: Diagnosis not present

## 2015-04-08 DIAGNOSIS — Z23 Encounter for immunization: Secondary | ICD-10-CM | POA: Diagnosis not present

## 2015-04-08 DIAGNOSIS — G894 Chronic pain syndrome: Secondary | ICD-10-CM | POA: Diagnosis not present

## 2015-04-20 DIAGNOSIS — Z79899 Other long term (current) drug therapy: Secondary | ICD-10-CM | POA: Diagnosis not present

## 2015-05-06 DIAGNOSIS — E119 Type 2 diabetes mellitus without complications: Secondary | ICD-10-CM | POA: Diagnosis not present

## 2015-05-06 DIAGNOSIS — I10 Essential (primary) hypertension: Secondary | ICD-10-CM | POA: Diagnosis not present

## 2015-05-14 DIAGNOSIS — E1165 Type 2 diabetes mellitus with hyperglycemia: Secondary | ICD-10-CM | POA: Diagnosis not present

## 2015-05-14 DIAGNOSIS — E782 Mixed hyperlipidemia: Secondary | ICD-10-CM | POA: Diagnosis not present

## 2015-05-14 DIAGNOSIS — G894 Chronic pain syndrome: Secondary | ICD-10-CM | POA: Diagnosis not present

## 2015-05-14 DIAGNOSIS — R944 Abnormal results of kidney function studies: Secondary | ICD-10-CM | POA: Diagnosis not present

## 2015-05-14 DIAGNOSIS — R69 Illness, unspecified: Secondary | ICD-10-CM | POA: Diagnosis not present

## 2015-05-14 DIAGNOSIS — I10 Essential (primary) hypertension: Secondary | ICD-10-CM | POA: Diagnosis not present

## 2015-07-05 DIAGNOSIS — R69 Illness, unspecified: Secondary | ICD-10-CM | POA: Diagnosis not present

## 2015-07-08 DIAGNOSIS — G894 Chronic pain syndrome: Secondary | ICD-10-CM | POA: Diagnosis not present

## 2015-07-14 DIAGNOSIS — E7800 Pure hypercholesterolemia, unspecified: Secondary | ICD-10-CM | POA: Diagnosis not present

## 2015-07-14 DIAGNOSIS — E039 Hypothyroidism, unspecified: Secondary | ICD-10-CM | POA: Diagnosis not present

## 2015-07-14 DIAGNOSIS — E1165 Type 2 diabetes mellitus with hyperglycemia: Secondary | ICD-10-CM | POA: Diagnosis not present

## 2015-07-14 DIAGNOSIS — I10 Essential (primary) hypertension: Secondary | ICD-10-CM | POA: Diagnosis not present

## 2015-07-21 DIAGNOSIS — Z09 Encounter for follow-up examination after completed treatment for conditions other than malignant neoplasm: Secondary | ICD-10-CM | POA: Diagnosis not present

## 2015-07-21 DIAGNOSIS — M19241 Secondary osteoarthritis, right hand: Secondary | ICD-10-CM | POA: Diagnosis not present

## 2015-07-21 DIAGNOSIS — M5137 Other intervertebral disc degeneration, lumbosacral region: Secondary | ICD-10-CM | POA: Diagnosis not present

## 2015-07-21 DIAGNOSIS — M174 Other bilateral secondary osteoarthritis of knee: Secondary | ICD-10-CM | POA: Diagnosis not present

## 2015-10-27 DIAGNOSIS — G894 Chronic pain syndrome: Secondary | ICD-10-CM | POA: Diagnosis not present

## 2015-10-27 DIAGNOSIS — Z79899 Other long term (current) drug therapy: Secondary | ICD-10-CM | POA: Diagnosis not present

## 2015-10-27 LAB — HEPATIC FUNCTION PANEL
ALT: 13 U/L (ref 7–35)
AST: 14 U/L (ref 13–35)
Alkaline Phosphatase: 100 U/L (ref 25–125)
BILIRUBIN, TOTAL: 0.9 mg/dL

## 2015-10-27 LAB — BASIC METABOLIC PANEL
BUN: 24 mg/dL — AB (ref 4–21)
Creatinine: 1.3 mg/dL — AB (ref 0.5–1.1)
GLUCOSE: 277 mg/dL
Potassium: 4.5 mmol/L (ref 3.4–5.3)
SODIUM: 134 mmol/L — AB (ref 137–147)

## 2015-10-27 LAB — CBC AND DIFFERENTIAL
HEMATOCRIT: 41 % (ref 36–46)
HEMOGLOBIN: 13.1 g/dL (ref 12.0–16.0)
Platelets: 257 10*3/uL (ref 150–399)
WBC: 6.5 10*3/mL

## 2015-11-17 DIAGNOSIS — E039 Hypothyroidism, unspecified: Secondary | ICD-10-CM | POA: Diagnosis not present

## 2015-11-17 DIAGNOSIS — E1165 Type 2 diabetes mellitus with hyperglycemia: Secondary | ICD-10-CM | POA: Diagnosis not present

## 2015-11-17 DIAGNOSIS — I10 Essential (primary) hypertension: Secondary | ICD-10-CM | POA: Diagnosis not present

## 2015-11-17 DIAGNOSIS — E7800 Pure hypercholesterolemia, unspecified: Secondary | ICD-10-CM | POA: Diagnosis not present

## 2015-11-17 DIAGNOSIS — G609 Hereditary and idiopathic neuropathy, unspecified: Secondary | ICD-10-CM | POA: Diagnosis not present

## 2015-12-12 DIAGNOSIS — Z9225 Personal history of immunosupression therapy: Secondary | ICD-10-CM | POA: Diagnosis not present

## 2015-12-23 DIAGNOSIS — Z9225 Personal history of immunosupression therapy: Secondary | ICD-10-CM | POA: Diagnosis not present

## 2016-01-10 ENCOUNTER — Telehealth: Payer: Self-pay | Admitting: Rheumatology

## 2016-01-10 NOTE — Telephone Encounter (Signed)
Patient would like to know results of last TB test. If patient does not answer the phone, she voiced permission to leave results on voicemail.

## 2016-01-11 NOTE — Telephone Encounter (Signed)
It was also indeterm. I have tried calling to advise, mailed her an order for a skin test. Called again to advise her. Would like to discuss, but she has asked me to leave a message. I have left message for her.

## 2016-01-12 ENCOUNTER — Ambulatory Visit: Payer: Self-pay | Admitting: Rheumatology

## 2016-01-18 ENCOUNTER — Encounter: Payer: Self-pay | Admitting: *Deleted

## 2016-01-18 DIAGNOSIS — Z23 Encounter for immunization: Secondary | ICD-10-CM | POA: Diagnosis not present

## 2016-01-18 DIAGNOSIS — M797 Fibromyalgia: Secondary | ICD-10-CM

## 2016-01-18 DIAGNOSIS — M170 Bilateral primary osteoarthritis of knee: Secondary | ICD-10-CM

## 2016-01-18 DIAGNOSIS — E7800 Pure hypercholesterolemia, unspecified: Secondary | ICD-10-CM

## 2016-01-18 DIAGNOSIS — B379 Candidiasis, unspecified: Secondary | ICD-10-CM | POA: Diagnosis not present

## 2016-01-18 DIAGNOSIS — M51369 Other intervertebral disc degeneration, lumbar region without mention of lumbar back pain or lower extremity pain: Secondary | ICD-10-CM

## 2016-01-18 DIAGNOSIS — L409 Psoriasis, unspecified: Secondary | ICD-10-CM

## 2016-01-18 DIAGNOSIS — M5136 Other intervertebral disc degeneration, lumbar region: Secondary | ICD-10-CM

## 2016-01-18 HISTORY — DX: Bilateral primary osteoarthritis of knee: M17.0

## 2016-01-18 HISTORY — DX: Fibromyalgia: M79.7

## 2016-01-18 HISTORY — DX: Psoriasis, unspecified: L40.9

## 2016-01-18 HISTORY — DX: Other intervertebral disc degeneration, lumbar region: M51.36

## 2016-01-18 HISTORY — DX: Pure hypercholesterolemia, unspecified: E78.00

## 2016-01-18 HISTORY — DX: Other intervertebral disc degeneration, lumbar region without mention of lumbar back pain or lower extremity pain: M51.369

## 2016-01-18 NOTE — Progress Notes (Signed)
*IMAGE* Office Visit Note  Patient: Ashley Caldwell             Date of Birth: [birth year]           MRN: [id number]             PCP: Pcp Not In System Referring: No ref. provider found Visit Date: 01/19/2016 Occupation:@GUAROCC @    Subjective:  Follow-up Follow-up on psoriatic arthritis psoriasis and high risk prescription.  History of Present Illness: SERGIO HOBART is a 75 y.o. female last seen on 07/21/2015. On that visit she had psoriatic arthritis and had increase SI joint pain and she was off of Enbrel. On that visit there had no synovitis on examination and had psoriasis showing few scattered plaques. She was taking methotrexate 8 pills per week and folic acid 2 per day. She was having fibromyalgia discomfort and 12 out of 18 tender points on that visit. She had ongoing knee joint stiffness. We ordered TB gold as well as a standing order and her TB gold resulted with indeterminant values. We will repeat this at no charge of the patient to establish whether or not the patient has TB gold positive or negative.  Today the patient states that she's had actually 2 TB test done and each of them were indeterminate. Patient will go to health Department and get the standard TB test since her TB gold are not coming out positive or negative and instead indeterminate. I will write her prescription for this.  Activities of Daily Living:  Patient reports morning stiffness for 15 minutes.   Patient Reports nocturnal pain.  Difficulty dressing/grooming: Reports Difficulty climbing stairs: Reports Difficulty getting out of chair: Reports Difficulty using hands for taps, buttons, cutlery, and/or writing: Reports   Review of Systems  Constitutional: Positive for fatigue.  HENT: Negative for mouth sores and mouth dryness.   Eyes: Negative for dryness.  Respiratory: Negative for shortness of breath.   Gastrointestinal: Negative for constipation and diarrhea.  Musculoskeletal: Positive for  arthralgias (r>l knee joint, left cmc pain, si jt pain), joint pain (r>l knee joint, left cmc pain, si jt pain), myalgias and myalgias.  Skin: Positive for rash (psoriasis). Negative for sensitivity to sunlight.  Psychiatric/Behavioral: Positive for sleep disturbance. Negative for decreased concentration.    PMFS History:  Patient Active Problem List   Diagnosis Date Noted  . Psoriasis 01/18/2016  . Fibromyalgia 01/18/2016  . DDD (degenerative disc disease), lumbar 01/18/2016  . Osteoarthritis of both knees 01/18/2016  . Elevated cholesterol 01/18/2016  . Hypoglycemia associated with diabetes (HCC) 01/16/2011  . Compression fracture of vertebral column (HCC) 01/16/2011  . Hyponatremia 01/14/2011  . Pneumonia 01/13/2011  . Hyperglycemia 01/13/2011  . Weakness generalized 01/13/2011  . DM type 2 (diabetes mellitus, type 2) (HCC) 01/13/2011  . HTN (hypertension), benign 01/13/2011  . Psoriatic arthritis (HCC) 01/13/2011  . Anemia 01/13/2011  . Thyroiditis, chronic 01/13/2011  . Morbid obesity (HCC) 01/13/2011  . Asthma 01/13/2011    Past Medical History:  Diagnosis Date  . Arthritis   . Asthma   . CFS (chronic fatigue syndrome)   . Compression fracture of vertebral column (HCC) 01/16/2011   per CXR  . DDD (degenerative disc disease), lumbar 01/18/2016  . Diabetes mellitus   . Elevated cholesterol 01/18/2016  . Fibromyalgia 01/18/2016  . Fibromyositis   . Hashimoto's thyroiditis   . HTN (hypertension), benign   . Hypoglycemia associated with diabetes (HCC) 01/16/2011  . Morbid obesity (HCC)   .  Osteoarthritis of both knees 01/18/2016  . Pneumonia 12/2010  . Psoriasis 01/18/2016  . Psoriatic arthritis (HCC)   . Thyroid disease     Family History  Problem Relation Age of Onset  . Diabetes Father   . Hypertension Mother   . Stroke Mother   . Cancer Brother   . Cancer Brother    Past Surgical History:  Procedure Laterality Date  . ABDOMINAL HYSTERECTOMY    . ABDOMINAL  SURGERY     lapband  . CATARACT EXTRACTION    . KNEE SURGERY     Social History   Social History Narrative  . No narrative on file     Objective: Vital Signs: BP 103/80 (BP Location: Left Arm, Patient Position: Sitting, Cuff Size: Large)   Pulse 95   Resp 18   Ht 5\' 7"  (1.702 m)    Physical Exam  Nursing note and vitals reviewed.    Musculoskeletal Exam:  Full range of motion of all joints Grip strength is equal and strong bilaterally Fibromyalgia tender points are 18/18 positive  CDAI Exam: CDAI Homunculus Exam:   Tenderness:  LUE: carpometacarpal RLE: tibiofemoral LLE: tibiofemoral  Joint Counts:  CDAI Tender Joint count: 2 CDAI Swollen Joint count: 0  Global Assessments:  Patient Global Assessment: 5 Provider Global Assessment: 5  CDAI Calculated Score: 12 On last visit there was no synovitis on examination 07/21/2015. Today patient has some synovial thickening of bilateral second MCP joint but no tenderness. She is doing quite well with her arthritis. Methotrexate is keeping things under fairly good control at the moment. We would like to start her back on Enbrel as soon as her labs and her TB status is updated. Patient is aware of this and is working on it.   Investigation: Findings:  Patient's TB gold is indeterminate and we will repeated today. Free of charge. CBC with differential CMP with GFR every 3 months starting today. I will go ahead and get repeat CBC and CMP since were going to have to get blood for repeat TB gold.  Labs from 10/27/2015 shows CMP with GFR within normal limits with sodium at 134 chloride at 96 glucose nonfasting at 277 creatinine to 1.27 GFR at 42. CBC with differential is normal    Imaging: No results found.  Speciality Comments: No specialty comments available.    Procedures:  No procedures performed Allergies: Amoxicillin-pot clavulanate and Lyrica [pregabalin]   Assessment / Plan: Visit Diagnoses: High risk  medications (not anticoagulants) long-term use - October 2017 TB gold is indeterminant and we will repeat - Plan: COMPLETE METABOLIC PANEL WITH GFR, CBC with Differential/Platelet, COMPLETE METABOLIC PANEL WITH GFR, CBC with Differential/Platelet  Psoriatic arthritis (HCC)  Psoriasis  Fibromyalgia  DDD (degenerative disc disease), lumbar  Primary osteoarthritis of both knees  Type 2 diabetes mellitus with complication, unspecified long term insulin use status (HCC)   CBC with differential, CMP with GFR,  TB gold will not be done today because she has had 2 indeterminant and she will need a TB skin test. She will get this from the health department. She has Enbrel already on hold and she should talk to start taking Enbrel after her TB test is negative. I have verbally given her this information as has Amy.  Standing order every 3 months.  Refilled meds were appropriate  Please write prescription to get a TB test from health Department for patient on a prescription pad  Return to clinic in 5 months   Orders:  Orders Placed This Encounter  Procedures  . COMPLETE METABOLIC PANEL WITH GFR  . CBC with Differential/Platelet  . COMPLETE METABOLIC PANEL WITH GFR  . CBC with Differential/Platelet   Meds ordered this encounter  Medications  . methotrexate (RHEUMATREX) 2.5 MG tablet    Sig: Take 8 tablets (20 mg total) by mouth once a week. Caution:Chemotherapy. Protect from light.Patient takes on Saturday!!! Patient states she has Not taking in 2 weeks!!    Dispense:  32 tablet    Refill:  2  . folic acid (FOLVITE) 1 MG tablet    Sig: Take 2 tablets (2 mg total) by mouth daily.    Dispense:  180 tablet    Refill:  4  . cyclobenzaprine (FLEXERIL) 10 MG tablet    Sig: Take 1 tablet (10 mg total) by mouth at bedtime.    Dispense:  30 tablet    Refill:  5  . DULoxetine (CYMBALTA) 60 MG capsule    Sig: Take 1 capsule (60 mg total) by mouth 2 (two) times daily.    Dispense:  60  capsule    Refill:  5  . clobetasol cream (TEMOVATE) 0.05 %    Sig: Apply 1 application topically 2 (two) times daily as needed.    Dispense:  30 g    Refill:  2  . zolpidem (AMBIEN) 10 MG tablet    Sig: Take 1 tablet (10 mg total) by mouth at bedtime.    Dispense:  30 tablet    Refill:  5  . BYDUREON 2 MG SRER  . fluconazole (DIFLUCAN) 150 MG tablet  . TRESIBA FLEXTOUCH 200 UNIT/ML SOPN  . DISCONTD: levothyroxine (SYNTHROID, LEVOTHROID) 150 MCG tablet  . DISCONTD: lisinopril (PRINIVIL,ZESTRIL) 20 MG tablet  . nystatin (MYCOSTATIN/NYSTOP) powder  . KLOR-CON SPRINKLE 10 MEQ CR capsule    Face-to-face time spent with patient was 40 minutes. 50% of time was spent in counseling and coordination of care.  Follow-Up Instructions: Return in about 5 months (around 06/18/2016) for PsA, Ps, HRRx, FMS, .

## 2016-01-19 ENCOUNTER — Encounter: Payer: Self-pay | Admitting: Rheumatology

## 2016-01-19 ENCOUNTER — Ambulatory Visit (INDEPENDENT_AMBULATORY_CARE_PROVIDER_SITE_OTHER): Payer: Medicare HMO | Admitting: Rheumatology

## 2016-01-19 VITALS — BP 103/80 | HR 95 | Resp 18 | Ht 67.0 in

## 2016-01-19 DIAGNOSIS — M170 Bilateral primary osteoarthritis of knee: Secondary | ICD-10-CM | POA: Diagnosis not present

## 2016-01-19 DIAGNOSIS — L409 Psoriasis, unspecified: Secondary | ICD-10-CM

## 2016-01-19 DIAGNOSIS — M5136 Other intervertebral disc degeneration, lumbar region: Secondary | ICD-10-CM | POA: Diagnosis not present

## 2016-01-19 DIAGNOSIS — E118 Type 2 diabetes mellitus with unspecified complications: Secondary | ICD-10-CM

## 2016-01-19 DIAGNOSIS — Z79899 Other long term (current) drug therapy: Secondary | ICD-10-CM

## 2016-01-19 DIAGNOSIS — M797 Fibromyalgia: Secondary | ICD-10-CM | POA: Diagnosis not present

## 2016-01-19 DIAGNOSIS — L4050 Arthropathic psoriasis, unspecified: Secondary | ICD-10-CM | POA: Diagnosis not present

## 2016-01-19 LAB — CBC WITH DIFFERENTIAL/PLATELET
BASOS PCT: 0 %
Basophils Absolute: 0 cells/uL (ref 0–200)
EOS ABS: 108 {cells}/uL (ref 15–500)
Eosinophils Relative: 2 %
HCT: 44.7 % (ref 35.0–45.0)
Hemoglobin: 14.5 g/dL (ref 11.7–15.5)
LYMPHS PCT: 22 %
Lymphs Abs: 1188 cells/uL (ref 850–3900)
MCH: 30.5 pg (ref 27.0–33.0)
MCHC: 32.4 g/dL (ref 32.0–36.0)
MCV: 94.1 fL (ref 80.0–100.0)
MONOS PCT: 10 %
MPV: 9.6 fL (ref 7.5–12.5)
Monocytes Absolute: 540 cells/uL (ref 200–950)
Neutro Abs: 3564 cells/uL (ref 1500–7800)
Neutrophils Relative %: 66 %
PLATELETS: 244 10*3/uL (ref 140–400)
RBC: 4.75 MIL/uL (ref 3.80–5.10)
RDW: 14.5 % (ref 11.0–15.0)
WBC: 5.4 10*3/uL (ref 3.8–10.8)

## 2016-01-19 MED ORDER — METHOTREXATE 2.5 MG PO TABS: 20.0000 mg | ORAL_TABLET | ORAL | 2 refills | Status: DC

## 2016-01-19 MED ORDER — CLOBETASOL PROPIONATE 0.05 % EX CREA: 1.0000 "application " | TOPICAL_CREAM | Freq: Two times a day (BID) | CUTANEOUS | 2 refills | Status: DC | PRN

## 2016-01-19 MED ORDER — ZOLPIDEM TARTRATE 10 MG PO TABS: 10.0000 mg | ORAL_TABLET | Freq: Every day | ORAL | 5 refills | Status: DC

## 2016-01-19 MED ORDER — FOLIC ACID 1 MG PO TABS: 2.0000 mg | ORAL_TABLET | Freq: Every day | ORAL | 4 refills | Status: DC

## 2016-01-19 MED ORDER — DULOXETINE HCL 60 MG PO CPEP: 60.0000 mg | ORAL_CAPSULE | Freq: Two times a day (BID) | ORAL | 5 refills | Status: DC

## 2016-01-19 MED ORDER — CYCLOBENZAPRINE HCL 10 MG PO TABS: 10.0000 mg | ORAL_TABLET | Freq: Every day | ORAL | 5 refills | Status: DC

## 2016-01-20 LAB — COMPLETE METABOLIC PANEL WITH GFR
ALT: 35 U/L — AB (ref 6–29)
AST: 35 U/L (ref 10–35)
Albumin: 3.8 g/dL (ref 3.6–5.1)
Alkaline Phosphatase: 185 U/L — ABNORMAL HIGH (ref 33–130)
BILIRUBIN TOTAL: 0.9 mg/dL (ref 0.2–1.2)
BUN: 15 mg/dL (ref 7–25)
CALCIUM: 9.5 mg/dL (ref 8.6–10.4)
CO2: 26 mmol/L (ref 20–31)
CREATININE: 1.21 mg/dL — AB (ref 0.60–0.93)
Chloride: 97 mmol/L — ABNORMAL LOW (ref 98–110)
GFR, EST AFRICAN AMERICAN: 51 mL/min — AB (ref >=60)
GFR, Est Non African American: 44 mL/min — ABNORMAL LOW (ref >=60)
Glucose, Bld: 280 mg/dL — ABNORMAL HIGH (ref 65–99)
Potassium: 4.9 mmol/L (ref 3.5–5.3)
Sodium: 136 mmol/L (ref 135–146)
TOTAL PROTEIN: 6.7 g/dL (ref 6.1–8.1)

## 2016-01-20 NOTE — Progress Notes (Signed)
Currently the patient's TB status continues to be indeterminate. As a result she is going to the health department to get a TB skin test. Once a TB skin test is negative she will start her Enbrel.If she starts her Enbrel than I can decrease the methotrexate.I 1 to decrease the methotrexate because her kidney function is a little bit poor. With a creatinine elevated and GFR low and the liver function being a little bit high as well as alkaline phosphatase 1 up with the patient on 6 pills of methotrexate instead of 8 pills of methotrexate.Please advise the patient that once she starts Enbrel and or TB gold is starting her TB skin test is negative then I want her to decrease the methotrexate to 6 pills every week. Then she will have labs done as scheduled and we will monitor how well she is doing with her liver function as well as her kidney function

## 2016-01-24 ENCOUNTER — Telehealth: Payer: Self-pay | Admitting: Radiology

## 2016-01-24 NOTE — Telephone Encounter (Signed)
-----  Message from Eliezer Lofts, [place] sent at 01/20/2016  4:20 PM EDT ----- Currently the patient's TB status continues to be indeterminate. As a result she is going to the health department to get a TB skin test. Once a TB skin test is negative she will start her Enbrel.If she starts her Enbrel than I can decrease the met hotrexate.I 1 to decrease the methotrexate because her kidney function is a little bit poor. With a creatinine elevated and GFR low and the liver function being a little bit high as well as alkaline phosphatase 1 up with the patient on 6 pills of m ethotrexate instead of 8 pills of methotrexate.Please advise the patient that once she starts Enbrel and or TB gold is starting her TB skin test is negative then I want her to decrease the methotrexate to 6 pills every week. Then she will have labs  done as scheduled and we will monitor how well she is doing with her liver function as well as her kidney function

## 2016-01-24 NOTE — Telephone Encounter (Signed)
Called patient to advise abnormal labs  Has she had skin test ?  I have advised her to call me back

## 2016-01-26 NOTE — Telephone Encounter (Signed)
She was supposed to call us back and let us know what her TB skin test was. I called her and left a message on her answering machine asking her to call us back and update [place] of what the status of that result was.Once we know the status [facility] we need to do next.

## 2016-01-26 NOTE — Telephone Encounter (Signed)
Patient has not returned my calls, but I am confused by your note, to my knowledge she has never stopped Enbrel. Did you tell her to stop it ?

## 2016-03-05 ENCOUNTER — Other Ambulatory Visit: Payer: Self-pay | Admitting: Rheumatology

## 2016-03-05 NOTE — Telephone Encounter (Signed)
Patient needs a refill of MTX sent to [facility] pharmacy.

## 2016-03-06 NOTE — Telephone Encounter (Signed)
Left message for patient to call the office

## 2016-03-07 ENCOUNTER — Other Ambulatory Visit: Payer: Self-pay | Admitting: Rheumatology

## 2016-03-07 NOTE — Telephone Encounter (Signed)
Patient advised she should have 2 refills on her MTX. Patient advised to contact the pharmacy to have refilled and to contact the office if she has any problems.

## 2016-04-11 ENCOUNTER — Encounter (HOSPITAL_COMMUNITY): Payer: Self-pay | Admitting: Emergency Medicine

## 2016-04-11 ENCOUNTER — Emergency Department (HOSPITAL_COMMUNITY): Payer: Medicare HMO

## 2016-04-11 ENCOUNTER — Emergency Department (HOSPITAL_COMMUNITY)
Admission: EM | Admit: 2016-04-11 | Discharge: 2016-04-11 | Disposition: A | Discharge status: Home / Self Care | Payer: Medicare HMO | Source: Home / Self Care | Attending: Emergency Medicine | Admitting: Emergency Medicine

## 2016-04-11 DIAGNOSIS — Y939 Activity, unspecified: Secondary | ICD-10-CM | POA: Insufficient documentation

## 2016-04-11 DIAGNOSIS — Z794 Long term (current) use of insulin: Secondary | ICD-10-CM

## 2016-04-11 DIAGNOSIS — E119 Type 2 diabetes mellitus without complications: Secondary | ICD-10-CM | POA: Insufficient documentation

## 2016-04-11 DIAGNOSIS — S42201A Unspecified fracture of upper end of right humerus, initial encounter for closed fracture: Secondary | ICD-10-CM | POA: Insufficient documentation

## 2016-04-11 DIAGNOSIS — Y929 Unspecified place or not applicable: Secondary | ICD-10-CM

## 2016-04-11 DIAGNOSIS — S20212A Contusion of left front wall of thorax, initial encounter: Secondary | ICD-10-CM | POA: Diagnosis not present

## 2016-04-11 DIAGNOSIS — W010XXA Fall on same level from slipping, tripping and stumbling without subsequent striking against object, initial encounter: Secondary | ICD-10-CM

## 2016-04-11 DIAGNOSIS — R52 Pain, unspecified: Secondary | ICD-10-CM | POA: Diagnosis not present

## 2016-04-11 DIAGNOSIS — M79602 Pain in left arm: Secondary | ICD-10-CM | POA: Diagnosis not present

## 2016-04-11 DIAGNOSIS — M25511 Pain in right shoulder: Secondary | ICD-10-CM | POA: Diagnosis not present

## 2016-04-11 DIAGNOSIS — Z87891 Personal history of nicotine dependence: Secondary | ICD-10-CM

## 2016-04-11 DIAGNOSIS — M79661 Pain in right lower leg: Secondary | ICD-10-CM | POA: Diagnosis not present

## 2016-04-11 DIAGNOSIS — I10 Essential (primary) hypertension: Secondary | ICD-10-CM

## 2016-04-11 DIAGNOSIS — N179 Acute kidney failure, unspecified: Secondary | ICD-10-CM | POA: Diagnosis not present

## 2016-04-11 DIAGNOSIS — I959 Hypotension, unspecified: Secondary | ICD-10-CM | POA: Diagnosis not present

## 2016-04-11 DIAGNOSIS — S42401D Unspecified fracture of lower end of right humerus, subsequent encounter for fracture with routine healing: Secondary | ICD-10-CM | POA: Diagnosis not present

## 2016-04-11 DIAGNOSIS — L309 Dermatitis, unspecified: Secondary | ICD-10-CM | POA: Diagnosis not present

## 2016-04-11 DIAGNOSIS — Y999 Unspecified external cause status: Secondary | ICD-10-CM | POA: Insufficient documentation

## 2016-04-11 DIAGNOSIS — Z79899 Other long term (current) drug therapy: Secondary | ICD-10-CM

## 2016-04-11 DIAGNOSIS — R531 Weakness: Secondary | ICD-10-CM | POA: Diagnosis not present

## 2016-04-11 DIAGNOSIS — J45909 Unspecified asthma, uncomplicated: Secondary | ICD-10-CM

## 2016-04-11 MED ORDER — OXYCODONE-ACETAMINOPHEN 5-325 MG PO TABS
2.0000 | ORAL_TABLET | Freq: Once | ORAL | Status: AC
  Administered 2016-04-11: 2 via ORAL
  Filled 2016-04-11: qty 2

## 2016-04-11 MED ORDER — MORPHINE SULFATE (PF) 4 MG/ML IV SOLN
6.0000 mg | Freq: Once | INTRAVENOUS | Status: AC
  Administered 2016-04-11: 6 mg via INTRAVENOUS
  Filled 2016-04-11: qty 2

## 2016-04-11 NOTE — Discharge Instructions (Signed)
Please call the orthopedic specialist for follow up

## 2016-04-11 NOTE — ED Triage Notes (Signed)
PT states she slipped and fell at the bottom of her bed landing on a piece of furniture and denies any LOC. PT states right upper arm and shoulder pain at this time and some bruising to left hand.

## 2016-04-11 NOTE — ED Notes (Signed)
PT requesting to waiting until around 10 am to be d/c.

## 2016-04-11 NOTE — ED Provider Notes (Signed)
[facility]-EM[facility]Y DEPT Provider Note   CSN: [id number] Arrival date & time: 04/11/16  7253     History   Chief Complaint Chief Complaint  Patient presents with  . Fall    HPI Ashley Caldwell is a 76 y.o. female.  HPI Patient reports tripping on her shoe and falling onto her right arm today.  She presents with right shoulder and right humerus pain.  She denies head injury.  Denies neck pain.  She is not on anticoagulants.  She does note some bruising to her left hand but reports no pain in that location.  She denies hip pain.  No chest pain or abdominal pain.  No back pain.  No other complaints.  Pain in her right shoulder is moderate to severe in severity and worse with palpation and range of motion of her right shoulder.   Past Medical History:  Diagnosis Date  . Arthritis   . Asthma   . CFS (chronic fatigue syndrome)   . Compression fracture of vertebral column (HCC) 01/16/2011   per CXR  . DDD (degenerative disc disease), lumbar 01/18/2016  . Diabetes mellitus   . Elevated cholesterol 01/18/2016  . Fibromyalgia 01/18/2016  . Fibromyositis   . Hashimoto's thyroiditis   . HTN (hypertension), benign   . Hypoglycemia associated with diabetes (HCC) 01/16/2011  . Morbid obesity (HCC)   . Osteoarthritis of both knees 01/18/2016  . Pneumonia 12/2010  . Psoriasis 01/18/2016  . Psoriatic arthritis (HCC)   . Thyroid disease     Patient Active Problem List   Diagnosis Date Noted  . Psoriasis 01/18/2016  . Fibromyalgia 01/18/2016  . DDD (degenerative disc disease), lumbar 01/18/2016  . Osteoarthritis of both knees 01/18/2016  . Elevated cholesterol 01/18/2016  . Hypoglycemia associated with diabetes (HCC) 01/16/2011  . Compression fracture of vertebral column (HCC) 01/16/2011  . Hyponatremia 01/14/2011  . Pneumonia 01/13/2011  . Hyperglycemia 01/13/2011  . Weakness generalized 01/13/2011  . DM type 2 (diabetes mellitus, type 2) (HCC) 01/13/2011  . HTN (hypertension), benign  01/13/2011  . Psoriatic arthritis (HCC) 01/13/2011  . Anemia 01/13/2011  . Thyroiditis, chronic 01/13/2011  . Morbid obesity (HCC) 01/13/2011  . Asthma 01/13/2011    Past Surgical History:  Procedure Laterality Date  . ABDOMINAL HYSTERECTOMY    . ABDOMINAL SURGERY     lapband  . CATARACT EXTRACTION    . KNEE SURGERY      OB History    Gravida Para Term Preterm AB Living             2   SAB TAB Ectopic Multiple Live Births                   Home Medications    Prior to Admission medications   Medication Sig Start Date End Date Taking? Authorizing Provider  albuterol (PROVENTIL HFA;VENTOLIN HFA) 108 (90 BASE) MCG/ACT inhaler Inhale 2 puffs into the lungs every 4 (four) hours as needed for wheezing or shortness of breath. 01/16/11 01/16/12  Elliot Cousin, MD  BYDUREON 2 MG SRER  11/18/15   Historical Provider, MD  cetirizine (ZYRTEC) 10 MG tablet Take 10 mg by mouth daily.      Historical Provider, MD  clobetasol cream (TEMOVATE) 0.05 % Apply 1 application topically 2 (two) times daily as needed. 01/19/16   Naitik Panwala, PA-C  cyclobenzaprine (FLEXERIL) 10 MG tablet Take 1 tablet (10 mg total) by mouth at bedtime. 01/19/16 07/17/16  Naitik Panwala, PA-C  DULoxetine (CYMBALTA) 60  MG capsule Take 1 capsule (60 mg total) by mouth 2 (two) times daily. 01/19/16 07/17/16  Naitik Panwala, PA-C  fluconazole (DIFLUCAN) 150 MG tablet  01/18/16   Historical Provider, MD  Fluticasone-Salmeterol (ADVAIR) 100-50 MCG/DOSE AEPB Inhale 1 puff into the lungs every 12 (twelve) hours.      Historical Provider, MD  folic acid (FOLVITE) 1 MG tablet Take 2 tablets (2 mg total) by mouth daily. 01/19/16 04/13/17  Naitik Panwala, PA-C  furosemide (LASIX) 20 MG tablet Take 20 mg by mouth.    Historical Provider, MD  hydrochlorothiazide (MICROZIDE) 12.5 MG capsule Take 12.5 mg by mouth daily.    Historical Provider, MD  insulin glargine (LANTUS) 100 UNIT/ML injection Inject 80 Units into the skin at  bedtime. Patient not taking: Reported on 01/19/2016 01/16/11   Elliot Cousin, MD  insulin lispro (HUMALOG) 100 UNIT/ML injection Inject 20 Units into the skin 3 (three) times daily before meals.      Historical Provider, MD  KLOR-CON SPRINKLE 10 MEQ CR capsule  11/29/15   Historical Provider, MD  levothyroxine (SYNTHROID, LEVOTHROID) 137 MCG tablet Take 137 mcg by mouth daily.      Historical Provider, MD  lisinopril (PRINIVIL,ZESTRIL) 30 MG tablet Take 30 mg by mouth daily.      Historical Provider, MD  methotrexate (RHEUMATREX) 2.5 MG tablet TAKE 8 TABLETS EACH WEEK. 03/07/16   Pollyann Savoy, MD  montelukast (SINGULAIR) 10 MG tablet Take 10 mg by mouth daily.      Historical Provider, MD  nystatin (MYCOSTATIN/NYSTOP) powder  01/05/16   Historical Provider, MD  oxyCODONE (OXY IR/ROXICODONE) 5 MG immediate release tablet Take 5 mg by mouth 3 (three) times daily.      Historical Provider, MD  potassium chloride (K-DUR) 10 MEQ tablet Take 10 mEq by mouth daily.    Historical Provider, MD  TRESIBA FLEXTOUCH 200 UNIT/ML [facility]  11/29/15   Historical Provider, MD  zolpidem (AMBIEN) 10 MG tablet Take 1 tablet (10 mg total) by mouth at bedtime. 01/19/16   Tawni Pummel, PA-C    Family History Family History  Problem Relation Age of Onset  . Diabetes Father   . Hypertension Mother   . Stroke Mother   . Cancer Brother   . Cancer Brother     Social History Social History  Substance Use Topics  . Smoking status: Former Smoker    Years: 20.00    Types: Cigarettes    Quit date: 01/19/1991  . Smokeless tobacco: Never Used  . Alcohol use No     Allergies   Amoxicillin-pot clavulanate and Lyrica [pregabalin]   Review of Systems Review of Systems  All other systems reviewed and are negative.    Physical Exam Updated Vital Signs BP 153/94 (BP Location: Left Wrist)   Pulse 96   Temp 98 F (36.7 C) (Oral)   Resp 16   Ht 5\' 7"  (1.702 m)   Wt 300 lb (136.1 kg)   SpO2 97%   BMI 46.99  kg/m   Physical Exam  Constitutional: She is oriented to person, place, and time. She appears well-developed and well-nourished.  HENT:  Head: Normocephalic and atraumatic.  Eyes: EOM are normal.  Neck: Normal range of motion. Neck supple.  No C-spine tenderness.  Pulmonary/Chest: Effort normal.  Abdominal: She exhibits no distension.  Musculoskeletal:  Full range of motion bilateral wrists, bilateral elbows.  Full range of motion bilateral ankles, knees, hips.  Limited range of motion of right shoulder secondary to pain  with tenderness of the proximal right humerus.  No open component to the injury.  Normal right radial pulse.  Normal grip strength right hand.  Full range of motion of the left shoulder  Neurological: She is alert and oriented to person, place, and time.  Psychiatric: She has a normal mood and affect.  Nursing note and vitals reviewed.    ED Treatments / Results  Labs (all labs ordered are listed, but only abnormal results are displayed) Labs Reviewed - No data to display  EKG  EKG Interpretation None       Radiology Dg Shoulder Right  Result Date: 04/11/2016 CLINICAL DATA:  Right shoulder pain.  Fall. EXAM: RIGHT SHOULDER - 2+ VIEW COMPARISON:  04/11/2016. FINDINGS: Angulated sub calf fracture of the proximal right humerus is present. No definite shoulder dislocation. Severe diffuse osteopenia. IMPRESSION: Angulated subcapital fracture of the of proximal right humerus. Electronically Signed   By: Maisie Fus  Register   On: 04/11/2016 08:23   Dg Humerus Right  Result Date: 04/11/2016 CLINICAL DATA:  Fall. EXAM: RIGHT HUMERUS - 2+ VIEW COMPARISON:  Chest x-ray of 01/13/2011. FINDINGS: Angulated subcapital fracture of the prior right humerus noted. No other definite focal abnormalities identified. Diffuse severe osteopenia . IMPRESSION: Angulated subcapital fracture of the prior right humerus. Electronically Signed   By: Maisie Fus  Register   On: 04/11/2016 08:24     Procedures Procedures (including critical care time)  Medications Ordered in ED Medications  morphine 4 MG/ML injection 6 mg (6 mg Intravenous Given 04/11/16 0800)     Initial Impression / Assessment and Plan / ED Course  I have reviewed the triage vital signs and the nursing notes.  Pertinent labs & imaging results that were available during my care of the patient were reviewed by me and considered in my medical decision making (see chart for details).     Proximal right humerus fracture.  Treated with sling.  Orthopedic follow-up.  Nonweightbearing right upper extremity.  Patient is on chronic oxycodone at home.  She will continue this for pain and call the on-call orthopedist for ongoing recommendations and follow-up.     Final Clinical Impressions(s) / ED Diagnoses   Final diagnoses:  Closed fracture of proximal end of right humerus, unspecified fracture morphology, initial encounter    New Prescriptions New Prescriptions   No medications on file     Azalia Bilis, MD 04/11/16 [phone]

## 2016-04-12 ENCOUNTER — Emergency Department (HOSPITAL_COMMUNITY): Payer: Medicare HMO

## 2016-04-12 ENCOUNTER — Emergency Department (HOSPITAL_COMMUNITY)
Admission: EM | Admit: 2016-04-12 | Discharge: 2016-04-12 | Disposition: A | Discharge status: Home / Self Care | Payer: Medicare HMO | Source: Home / Self Care | Attending: Emergency Medicine | Admitting: Emergency Medicine

## 2016-04-12 ENCOUNTER — Encounter (HOSPITAL_COMMUNITY): Payer: Self-pay | Admitting: Emergency Medicine

## 2016-04-12 DIAGNOSIS — E119 Type 2 diabetes mellitus without complications: Secondary | ICD-10-CM

## 2016-04-12 DIAGNOSIS — J45909 Unspecified asthma, uncomplicated: Secondary | ICD-10-CM | POA: Insufficient documentation

## 2016-04-12 DIAGNOSIS — R0789 Other chest pain: Secondary | ICD-10-CM | POA: Insufficient documentation

## 2016-04-12 DIAGNOSIS — I10 Essential (primary) hypertension: Secondary | ICD-10-CM

## 2016-04-12 DIAGNOSIS — S42201D Unspecified fracture of upper end of right humerus, subsequent encounter for fracture with routine healing: Secondary | ICD-10-CM

## 2016-04-12 DIAGNOSIS — Z87891 Personal history of nicotine dependence: Secondary | ICD-10-CM | POA: Insufficient documentation

## 2016-04-12 DIAGNOSIS — S20212A Contusion of left front wall of thorax, initial encounter: Secondary | ICD-10-CM | POA: Diagnosis not present

## 2016-04-12 DIAGNOSIS — Z79899 Other long term (current) drug therapy: Secondary | ICD-10-CM | POA: Insufficient documentation

## 2016-04-12 DIAGNOSIS — W19XXXA Unspecified fall, initial encounter: Secondary | ICD-10-CM

## 2016-04-12 DIAGNOSIS — S42401D Unspecified fracture of lower end of right humerus, subsequent encounter for fracture with routine healing: Secondary | ICD-10-CM | POA: Diagnosis not present

## 2016-04-12 DIAGNOSIS — W19XXXD Unspecified fall, subsequent encounter: Secondary | ICD-10-CM | POA: Insufficient documentation

## 2016-04-12 DIAGNOSIS — B372 Candidiasis of skin and nail: Secondary | ICD-10-CM

## 2016-04-12 DIAGNOSIS — L309 Dermatitis, unspecified: Secondary | ICD-10-CM | POA: Diagnosis not present

## 2016-04-12 DIAGNOSIS — R0781 Pleurodynia: Secondary | ICD-10-CM | POA: Diagnosis not present

## 2016-04-12 DIAGNOSIS — M79661 Pain in right lower leg: Secondary | ICD-10-CM | POA: Diagnosis not present

## 2016-04-12 DIAGNOSIS — S42201A Unspecified fracture of upper end of right humerus, initial encounter for closed fracture: Secondary | ICD-10-CM

## 2016-04-12 DIAGNOSIS — M79601 Pain in right arm: Secondary | ICD-10-CM | POA: Diagnosis not present

## 2016-04-12 MED ORDER — HYDROMORPHONE HCL 1 MG/ML IJ SOLN
1.0000 mg | Freq: Once | INTRAMUSCULAR | Status: AC
  Administered 2016-04-12: 1 mg via INTRAMUSCULAR
  Filled 2016-04-12: qty 1

## 2016-04-12 MED ORDER — OXYCODONE-ACETAMINOPHEN 5-325 MG PO TABS
2.0000 | ORAL_TABLET | Freq: Once | ORAL | Status: AC
  Administered 2016-04-12: 2 via ORAL
  Filled 2016-04-12: qty 2

## 2016-04-12 NOTE — ED Triage Notes (Signed)
Pt fell yesterday and was evaluated in ER.  C/o rt and lt rib pain with movement today.

## 2016-04-12 NOTE — ED Provider Notes (Signed)
[facility] DEPT Provider Note   CSN: [id number] Arrival date & time: 04/12/16  1207     History   Chief Complaint Chief Complaint  Patient presents with  . Fall    HPI Ashley Caldwell is a 76 y.o. female.  Status post fall yesterday with a proximal right humerus fracture. Patient is seen today for continued pain in the right humerus, left lateral ribs, right tib-fib. No chest pain or dyspnea. Patient is taking Percocet for pain. Severity is mild to moderate. Palpation makes pain worse.      Past Medical History:  Diagnosis Date  . Arthritis   . Asthma   . CFS (chronic fatigue syndrome)   . Compression fracture of vertebral column (HCC) 01/16/2011   per CXR  . DDD (degenerative disc disease), lumbar 01/18/2016  . Diabetes mellitus   . Elevated cholesterol 01/18/2016  . Fibromyalgia 01/18/2016  . Fibromyositis   . Hashimoto's thyroiditis   . HTN (hypertension), benign   . Hypoglycemia associated with diabetes (HCC) 01/16/2011  . Morbid obesity (HCC)   . Osteoarthritis of both knees 01/18/2016  . Pneumonia 12/2010  . Psoriasis 01/18/2016  . Psoriatic arthritis (HCC)   . Thyroid disease     Patient Active Problem List   Diagnosis Date Noted  . Psoriasis 01/18/2016  . Fibromyalgia 01/18/2016  . DDD (degenerative disc disease), lumbar 01/18/2016  . Osteoarthritis of both knees 01/18/2016  . Elevated cholesterol 01/18/2016  . Hypoglycemia associated with diabetes (HCC) 01/16/2011  . Compression fracture of vertebral column (HCC) 01/16/2011  . Hyponatremia 01/14/2011  . Pneumonia 01/13/2011  . Hyperglycemia 01/13/2011  . Weakness generalized 01/13/2011  . DM type 2 (diabetes mellitus, type 2) (HCC) 01/13/2011  . HTN (hypertension), benign 01/13/2011  . Psoriatic arthritis (HCC) 01/13/2011  . Anemia 01/13/2011  . Thyroiditis, chronic 01/13/2011  . Morbid obesity (HCC) 01/13/2011  . Asthma 01/13/2011    Past Surgical History:  Procedure Laterality Date  .  ABDOMINAL HYSTERECTOMY    . ABDOMINAL SURGERY     lapband  . CATARACT EXTRACTION    . KNEE SURGERY      OB History    Gravida Para Term Preterm AB Living             2   SAB TAB Ectopic Multiple Live Births                   Home Medications    Prior to Admission medications   Medication Sig Start Date End Date Taking? Authorizing Provider  cetirizine (ZYRTEC) 10 MG tablet Take 10 mg by mouth daily.     Yes Historical Provider, MD  clobetasol cream (TEMOVATE) 0.05 % Apply 1 application topically 2 (two) times daily as needed. 01/19/16  Yes Naitik Panwala, PA-C  Fluticasone-Salmeterol (ADVAIR) 100-50 MCG/DOSE AEPB Inhale 1 puff into the lungs every 12 (twelve) hours.     Yes Historical Provider, MD  albuterol (PROVENTIL HFA;VENTOLIN HFA) 108 (90 BASE) MCG/ACT inhaler Inhale 2 puffs into the lungs every 4 (four) hours as needed for wheezing or shortness of breath. 01/16/11 01/16/12  Elliot Cousin, MD  BYDUREON 2 MG SRER  11/18/15   Historical Provider, MD  cyclobenzaprine (FLEXERIL) 10 MG tablet Take 1 tablet (10 mg total) by mouth at bedtime. 01/19/16 07/17/16  Naitik Panwala, PA-C  DULoxetine (CYMBALTA) 60 MG capsule Take 1 capsule (60 mg total) by mouth 2 (two) times daily. 01/19/16 07/17/16  Naitik Panwala, PA-C  fluconazole (DIFLUCAN) 150 MG tablet  01/18/16   Historical Provider, MD  folic acid (FOLVITE) 1 MG tablet Take 2 tablets (2 mg total) by mouth daily. 01/19/16 04/13/17  Naitik Panwala, PA-C  furosemide (LASIX) 20 MG tablet Take 20 mg by mouth.    Historical Provider, MD  hydrochlorothiazide (MICROZIDE) 12.5 MG capsule Take 12.5 mg by mouth daily.    Historical Provider, MD  insulin glargine (LANTUS) 100 UNIT/ML injection Inject 80 Units into the skin at bedtime. Patient not taking: Reported on 01/19/2016 01/16/11   Elliot Cousin, MD  insulin lispro (HUMALOG) 100 UNIT/ML injection Inject 20 Units into the skin 3 (three) times daily before meals.      Historical Provider, MD  KLOR-CON  SPRINKLE 10 MEQ CR capsule  11/29/15   Historical Provider, MD  levothyroxine (SYNTHROID, LEVOTHROID) 137 MCG tablet Take 137 mcg by mouth daily.      Historical Provider, MD  lisinopril (PRINIVIL,ZESTRIL) 30 MG tablet Take 30 mg by mouth daily.      Historical Provider, MD  methotrexate (RHEUMATREX) 2.5 MG tablet TAKE 8 TABLETS EACH WEEK. 03/07/16   Pollyann Savoy, MD  montelukast (SINGULAIR) 10 MG tablet Take 10 mg by mouth daily.      Historical Provider, MD  nystatin (MYCOSTATIN/NYSTOP) powder  01/05/16   Historical Provider, MD  oxyCODONE (OXY IR/ROXICODONE) 5 MG immediate release tablet Take 5 mg by mouth 3 (three) times daily.      Historical Provider, MD  potassium chloride (K-DUR) 10 MEQ tablet Take 10 mEq by mouth daily.    Historical Provider, MD  TRESIBA FLEXTOUCH 200 UNIT/ML [facility]  11/29/15   Historical Provider, MD  zolpidem (AMBIEN) 10 MG tablet Take 1 tablet (10 mg total) by mouth at bedtime. 01/19/16   Tawni Pummel, PA-C    Family History Family History  Problem Relation Age of Onset  . Diabetes Father   . Hypertension Mother   . Stroke Mother   . Cancer Brother   . Cancer Brother     Social History Social History  Substance Use Topics  . Smoking status: Former Smoker    Years: 20.00    Types: Cigarettes    Quit date: 01/19/1991  . Smokeless tobacco: Never Used  . Alcohol use No     Allergies   Amoxicillin-pot clavulanate and Lyrica [pregabalin]   Review of Systems Review of Systems  All other systems reviewed and are negative.    Physical Exam Updated Vital Signs BP 114/74 (BP Location: Left Wrist)   Pulse 81   Temp 97.8 F (36.6 C) (Oral)   Resp 16   Ht 5\' 7"  (1.702 m)   Wt 300 lb (136.1 kg)   SpO2 96%   BMI 46.99 kg/m   Physical Exam  Constitutional: She is oriented to person, place, and time.  Obese, and poor health  HENT:  Head: Normocephalic and atraumatic.  Eyes: Conjunctivae are normal.  Neck: Neck supple.  Cardiovascular: Normal  rate and regular rhythm.   Pulmonary/Chest:  Tender left lateral chest wall.  Diffuse erythematous macular rash under right breast  Abdominal: Soft. Bowel sounds are normal.  Musculoskeletal:  Tender proximal right humerus and proximal tibia  Neurological: She is alert and oriented to person, place, and time.  Skin: Skin is warm and dry.  Psychiatric: She has a normal mood and affect. Her behavior is normal.  Nursing note and vitals reviewed.    ED Treatments / Results  Labs (all labs ordered are listed, but only abnormal results are displayed) Labs  Reviewed - No data to display  EKG  EKG Interpretation None       Radiology Dg Ribs Unilateral W/chest Left  Result Date: 04/12/2016 CLINICAL DATA:  RIGHT lower leg pain, LEFT rib pain at breast area, bruising at LEFT ribs, slipped and fell 2 days ago, known RIGHT humeral fracture post fall, diabetes mellitus, hypertension calf EXAM: LEFT RIBS AND CHEST - 3+ VIEW COMPARISON:  Chest radiograph 01/13/2011 FINDINGS: Enlargement of cardiac silhouette. Tortuous aorta. Mediastinal contours and hilar vascularity normal. Chronic bronchitic changes without pulmonary infiltrate, pleural effusion or pneumothorax. Slight chronic accentuation of LEFT basilar markings appears unchanged. Diffuse osseous demineralization. Rib assessment is limited by the degree of osseous demineralization. No definite rib fractures of bone destruction. Prior laparoscopic gastric banding. IMPRESSION: Enlargement of cardiac silhouette. Minimal chronic lung changes without acute infiltrate. Osseous demineralization without definite LEFT rib fracture. Electronically Signed   By: Ulyses Southward M.D.   On: 04/12/2016 14:04   Dg Shoulder Right  Result Date: 04/11/2016 CLINICAL DATA:  Right shoulder pain.  Fall. EXAM: RIGHT SHOULDER - 2+ VIEW COMPARISON:  04/11/2016. FINDINGS: Angulated sub calf fracture of the proximal right humerus is present. No definite shoulder dislocation.  Severe diffuse osteopenia. IMPRESSION: Angulated subcapital fracture of the of proximal right humerus. Electronically Signed   By: Maisie Fus  Register   On: 04/11/2016 08:23   Dg Tibia/fibula Right  Result Date: 04/12/2016 CLINICAL DATA:  Right lower leg pain EXAM: RIGHT TIBIA AND FIBULA - 2 VIEW COMPARISON:  None. FINDINGS: No fracture. No worrisome lytic or sclerotic bony abnormality. Advanced degenerative changes are noted in the knee. IMPRESSION: No acute findings Electronically Signed   By: Kennith Center M.D.   On: 04/12/2016 14:04   Dg Humerus Right  Result Date: 04/11/2016 CLINICAL DATA:  Fall. EXAM: RIGHT HUMERUS - 2+ VIEW COMPARISON:  Chest x-ray of 01/13/2011. FINDINGS: Angulated subcapital fracture of the prior right humerus noted. No other definite focal abnormalities identified. Diffuse severe osteopenia . IMPRESSION: Angulated subcapital fracture of the prior right humerus. Electronically Signed   By: Maisie Fus  Register   On: 04/11/2016 08:24    Procedures Procedures (including critical care time)  Medications Ordered in ED Medications  oxyCODONE-acetaminophen (PERCOCET/ROXICET) 5-325 MG per tablet 2 tablet (2 tablets Oral Given 04/12/16 1327)  HYDROmorphone (DILAUDID) injection 1 mg (1 mg Intramuscular Given 04/12/16 1542)     Initial Impression / Assessment and Plan / ED Course  I have reviewed the triage vital signs and the nursing notes.  Pertinent labs & imaging results that were available during my care of the patient were reviewed by me and considered in my medical decision making (see chart for details).    New films of left ribs and right tib-fib show no acute fracture. She a known proximal right humerus fracture.  Social work performed a Administrator, sports.  Discussed finding with the patient and her daughter. She will follow-up with orthopedics tomorrow. Home care arranged.  Final Clinical Impressions(s) / ED Diagnoses   Final diagnoses:  Fall, subsequent encounter     New Prescriptions New Prescriptions   No medications on file     Donnetta Hutching, MD 04/12/16 [phone]

## 2016-04-12 NOTE — ED Notes (Signed)
MD at the bedside  

## 2016-04-12 NOTE — Care Management Note (Signed)
Case Management Note  Patient Details  Name: Ashley Caldwell MRN: [id number] Date of Birth: 01/22/1941  Subjective/Objective:  Patient from home with daughter, recent right humerus fracture. Patient presenst today with increased pain and need for Home health services. Daughter, Ashley Caldwell states she needs help caring for her mom. CM explained insurance will pay for [facility] services 2-3 times a week and visits last approx. 45 minutes. CM will order RN, OT, aide and SW. CM offered private duty list, Ashley Caldwell declined as she doesn't have resources to pay private duty care. [name] asks about placement options. CM will add SW to [facility] services to help facilitate placement from home. Patient has RW at home prior to arrival, if [facility] is needed, patient will need a script at time of discharge as no DME is available in hospital currently.              Action/Plan: CM will notify Alroy Bailiff of [employer] who will obtain orders from chart. Patient still has pending tests in ER.  CM will check tomorrow to see if patient is admitted to hospital, in which case [facility] referral will be canceled.   Expected Discharge Date:        04/12/2016          Expected Discharge Plan:  Home w Home Health Services  In-House Referral:  NA  Discharge planning Services  CM Consult  Post Acute Care Choice:  Home Health Choice offered to:  Patient, Adult Children  DME Arranged:    DME Agency:     HH Arranged:  RN, OT, Social Work, Nurse's Aide HH Agency:  [facility]  Status of Service:  Completed, signed off  If discussed at [employer], dates discussed:    Additional Comments:  Izel Eisenhardt, Chrystine Oiler, RN 04/12/2016, 3:06 PM

## 2016-04-12 NOTE — Discharge Instructions (Signed)
X-rays of right shin and left ribs show no new fractures.  We obtained a social work consult for home health. You can also hire home health personnel.  Take your pain medication as needed. Follow up with orthopedics for the right arm.  Over-the-counter yeast medication for the left breast

## 2016-04-13 ENCOUNTER — Inpatient Hospital Stay (HOSPITAL_COMMUNITY): Payer: Medicare HMO

## 2016-04-13 ENCOUNTER — Ambulatory Visit (INDEPENDENT_AMBULATORY_CARE_PROVIDER_SITE_OTHER): Payer: Medicare HMO | Admitting: Orthopedic Surgery

## 2016-04-13 ENCOUNTER — Inpatient Hospital Stay (HOSPITAL_COMMUNITY)
Admission: EM | Admit: 2016-04-13 | Discharge: 2016-04-16 | DRG: 683 | Disposition: A | Discharge status: Skilled Nursing Facility | Payer: Medicare HMO | Attending: Internal Medicine | Admitting: Internal Medicine

## 2016-04-13 ENCOUNTER — Encounter (HOSPITAL_COMMUNITY): Payer: Self-pay | Admitting: Emergency Medicine

## 2016-04-13 VITALS — BP 78/49 | HR 91 | Ht 67.0 in

## 2016-04-13 DIAGNOSIS — I10 Essential (primary) hypertension: Secondary | ICD-10-CM | POA: Diagnosis present

## 2016-04-13 DIAGNOSIS — R627 Adult failure to thrive: Secondary | ICD-10-CM | POA: Diagnosis not present

## 2016-04-13 DIAGNOSIS — E871 Hypo-osmolality and hyponatremia: Secondary | ICD-10-CM | POA: Diagnosis not present

## 2016-04-13 DIAGNOSIS — Z7951 Long term (current) use of inhaled steroids: Secondary | ICD-10-CM | POA: Diagnosis not present

## 2016-04-13 DIAGNOSIS — E119 Type 2 diabetes mellitus without complications: Secondary | ICD-10-CM | POA: Diagnosis not present

## 2016-04-13 DIAGNOSIS — M797 Fibromyalgia: Secondary | ICD-10-CM | POA: Diagnosis present

## 2016-04-13 DIAGNOSIS — Z79899 Other long term (current) drug therapy: Secondary | ICD-10-CM

## 2016-04-13 DIAGNOSIS — R1312 Dysphagia, oropharyngeal phase: Secondary | ICD-10-CM | POA: Diagnosis not present

## 2016-04-13 DIAGNOSIS — R279 Unspecified lack of coordination: Secondary | ICD-10-CM | POA: Diagnosis not present

## 2016-04-13 DIAGNOSIS — S42201A Unspecified fracture of upper end of right humerus, initial encounter for closed fracture: Secondary | ICD-10-CM | POA: Diagnosis not present

## 2016-04-13 DIAGNOSIS — T501X5A Adverse effect of loop [high-ceiling] diuretics, initial encounter: Secondary | ICD-10-CM | POA: Diagnosis present

## 2016-04-13 DIAGNOSIS — Z6841 Body Mass Index (BMI) 40.0 and over, adult: Secondary | ICD-10-CM | POA: Diagnosis not present

## 2016-04-13 DIAGNOSIS — S42201D Unspecified fracture of upper end of right humerus, subsequent encounter for fracture with routine healing: Secondary | ICD-10-CM | POA: Diagnosis not present

## 2016-04-13 DIAGNOSIS — I959 Hypotension, unspecified: Secondary | ICD-10-CM | POA: Diagnosis not present

## 2016-04-13 DIAGNOSIS — Z743 Need for continuous supervision: Secondary | ICD-10-CM | POA: Diagnosis not present

## 2016-04-13 DIAGNOSIS — E1100 Type 2 diabetes mellitus with hyperosmolarity without nonketotic hyperglycemic-hyperosmolar coma (NKHHC): Secondary | ICD-10-CM

## 2016-04-13 DIAGNOSIS — Z9884 Bariatric surgery status: Secondary | ICD-10-CM

## 2016-04-13 DIAGNOSIS — R2681 Unsteadiness on feet: Secondary | ICD-10-CM | POA: Diagnosis not present

## 2016-04-13 DIAGNOSIS — Z87891 Personal history of nicotine dependence: Secondary | ICD-10-CM

## 2016-04-13 DIAGNOSIS — Z791 Long term (current) use of non-steroidal anti-inflammatories (NSAID): Secondary | ICD-10-CM

## 2016-04-13 DIAGNOSIS — J4520 Mild intermittent asthma, uncomplicated: Secondary | ICD-10-CM | POA: Diagnosis not present

## 2016-04-13 DIAGNOSIS — E065 Other chronic thyroiditis: Secondary | ICD-10-CM | POA: Diagnosis not present

## 2016-04-13 DIAGNOSIS — J45909 Unspecified asthma, uncomplicated: Secondary | ICD-10-CM | POA: Diagnosis present

## 2016-04-13 DIAGNOSIS — E069 Thyroiditis, unspecified: Secondary | ICD-10-CM | POA: Diagnosis present

## 2016-04-13 DIAGNOSIS — R5381 Other malaise: Secondary | ICD-10-CM | POA: Diagnosis present

## 2016-04-13 DIAGNOSIS — S42291P Other displaced fracture of upper end of right humerus, subsequent encounter for fracture with malunion: Secondary | ICD-10-CM | POA: Diagnosis not present

## 2016-04-13 DIAGNOSIS — K5900 Constipation, unspecified: Secondary | ICD-10-CM | POA: Diagnosis not present

## 2016-04-13 DIAGNOSIS — E039 Hypothyroidism, unspecified: Secondary | ICD-10-CM | POA: Diagnosis present

## 2016-04-13 DIAGNOSIS — Z794 Long term (current) use of insulin: Secondary | ICD-10-CM | POA: Diagnosis not present

## 2016-04-13 DIAGNOSIS — S42292A Other displaced fracture of upper end of left humerus, initial encounter for closed fracture: Secondary | ICD-10-CM

## 2016-04-13 DIAGNOSIS — E860 Dehydration: Secondary | ICD-10-CM | POA: Diagnosis not present

## 2016-04-13 DIAGNOSIS — M6281 Muscle weakness (generalized): Secondary | ICD-10-CM

## 2016-04-13 DIAGNOSIS — T464X5A Adverse effect of angiotensin-converting-enzyme inhibitors, initial encounter: Secondary | ICD-10-CM | POA: Diagnosis present

## 2016-04-13 DIAGNOSIS — N170 Acute kidney failure with tubular necrosis: Secondary | ICD-10-CM

## 2016-04-13 DIAGNOSIS — N179 Acute kidney failure, unspecified: Secondary | ICD-10-CM | POA: Diagnosis not present

## 2016-04-13 DIAGNOSIS — R0602 Shortness of breath: Secondary | ICD-10-CM

## 2016-04-13 DIAGNOSIS — W19XXXA Unspecified fall, initial encounter: Secondary | ICD-10-CM | POA: Diagnosis present

## 2016-04-13 DIAGNOSIS — Z7401 Bed confinement status: Secondary | ICD-10-CM | POA: Diagnosis not present

## 2016-04-13 DIAGNOSIS — S42201P Unspecified fracture of upper end of right humerus, subsequent encounter for fracture with malunion: Secondary | ICD-10-CM

## 2016-04-13 DIAGNOSIS — N19 Unspecified kidney failure: Secondary | ICD-10-CM | POA: Diagnosis present

## 2016-04-13 DIAGNOSIS — R1311 Dysphagia, oral phase: Secondary | ICD-10-CM | POA: Diagnosis not present

## 2016-04-13 DIAGNOSIS — E669 Obesity, unspecified: Secondary | ICD-10-CM | POA: Diagnosis not present

## 2016-04-13 LAB — GLUCOSE, CAPILLARY
GLUCOSE-CAPILLARY: 221 mg/dL — AB (ref 65–99)
GLUCOSE-CAPILLARY: 166 mg/dL — AB (ref 65–99)

## 2016-04-13 LAB — BASIC METABOLIC PANEL
Anion gap: 11 (ref 5–15)
BUN: 43 mg/dL — AB (ref 6–20)
CALCIUM: 9.4 mg/dL (ref 8.9–10.3)
CO2: 26 mmol/L (ref 22–32)
Chloride: 90 mmol/L — ABNORMAL LOW (ref 101–111)
Creatinine, Ser: 3.42 mg/dL — ABNORMAL HIGH (ref 0.44–1.00)
GFR calc Af Amer: 14 mL/min — ABNORMAL LOW (ref >60)
GFR, EST NON AFRICAN AMERICAN: 12 mL/min — AB (ref >60)
GLUCOSE: 222 mg/dL — AB (ref 65–99)
POTASSIUM: 4.6 mmol/L (ref 3.5–5.1)
Sodium: 127 mmol/L — ABNORMAL LOW (ref 135–145)

## 2016-04-13 LAB — CBC WITH DIFFERENTIAL/PLATELET
Basophils Absolute: 0 10*3/uL (ref 0.0–0.1)
Basophils Relative: 0 %
EOS PCT: 1 %
Eosinophils Absolute: 0.1 10*3/uL (ref 0.0–0.7)
HEMATOCRIT: 38 % (ref 36.0–46.0)
Hemoglobin: 12.9 g/dL (ref 12.0–15.0)
LYMPHS ABS: 1.1 10*3/uL (ref 0.7–4.0)
LYMPHS PCT: 9 %
MCH: 32.2 pg (ref 26.0–34.0)
MCHC: 33.9 g/dL (ref 30.0–36.0)
MCV: 94.8 fL (ref 78.0–100.0)
MONO ABS: 1.5 10*3/uL — AB (ref 0.1–1.0)
MONOS PCT: 12 %
NEUTROS ABS: 10.4 10*3/uL — AB (ref 1.7–7.7)
Neutrophils Relative %: 78 %
PLATELETS: 244 10*3/uL (ref 150–400)
RBC: 4.01 MIL/uL (ref 3.87–5.11)
RDW: 14.1 % (ref 11.5–15.5)
WBC: 13.2 10*3/uL — ABNORMAL HIGH (ref 4.0–10.5)

## 2016-04-13 LAB — TSH: TSH: 1.176 u[IU]/mL (ref 0.350–4.500)

## 2016-04-13 LAB — OSMOLALITY: Osmolality: 285 mOsm/kg (ref 275–295)

## 2016-04-13 MED ORDER — SODIUM CHLORIDE 0.9 % IV SOLN
INTRAVENOUS | Status: DC
  Administered 2016-04-13 – 2016-04-14 (×4): via INTRAVENOUS

## 2016-04-13 MED ORDER — ONDANSETRON HCL 4 MG PO TABS: 4.0000 mg | ORAL_TABLET | Freq: Four times a day (QID) | ORAL | Status: DC | PRN

## 2016-04-13 MED ORDER — SODIUM CHLORIDE 0.9 % IV SOLN
INTRAVENOUS | Status: AC
  Administered 2016-04-13: 18:00:00 via INTRAVENOUS

## 2016-04-13 MED ORDER — HEPARIN SODIUM (PORCINE) 5000 UNIT/ML IJ SOLN
5000.0000 [IU] | Freq: Three times a day (TID) | INTRAMUSCULAR | Status: DC
  Administered 2016-04-13 – 2016-04-16 (×9): 5000 [IU] via SUBCUTANEOUS
  Filled 2016-04-13 (×9): qty 1

## 2016-04-13 MED ORDER — SODIUM CHLORIDE 0.9 % IV BOLUS (SEPSIS)
1000.0000 mL | Freq: Once | INTRAVENOUS | Status: AC
  Administered 2016-04-13: 1000 mL via INTRAVENOUS

## 2016-04-13 MED ORDER — CYCLOBENZAPRINE HCL 10 MG PO TABS
5.0000 mg | ORAL_TABLET | Freq: Every day | ORAL | Status: DC
  Administered 2016-04-14 – 2016-04-15 (×2): 5 mg via ORAL
  Filled 2016-04-13 (×3): qty 1

## 2016-04-13 MED ORDER — FOLIC ACID 1 MG PO TABS
2.0000 mg | ORAL_TABLET | Freq: Every day | ORAL | Status: DC
  Administered 2016-04-14 – 2016-04-16 (×3): 2 mg via ORAL
  Filled 2016-04-13 (×3): qty 2

## 2016-04-13 MED ORDER — SODIUM CHLORIDE 0.9 % IV SOLN: INTRAVENOUS | Status: DC

## 2016-04-13 MED ORDER — DULOXETINE HCL 60 MG PO CPEP
60.0000 mg | ORAL_CAPSULE | Freq: Two times a day (BID) | ORAL | Status: DC
  Administered 2016-04-13 – 2016-04-16 (×6): 60 mg via ORAL
  Filled 2016-04-13: qty 2
  Filled 2016-04-13: qty 1
  Filled 2016-04-13 (×2): qty 2
  Filled 2016-04-13 (×2): qty 1
  Filled 2016-04-13: qty 2
  Filled 2016-04-13: qty 1
  Filled 2016-04-13: qty 2
  Filled 2016-04-13: qty 1
  Filled 2016-04-13: qty 2
  Filled 2016-04-13: qty 1

## 2016-04-13 MED ORDER — ALBUTEROL SULFATE HFA 108 (90 BASE) MCG/ACT IN AERS: 2.0000 | INHALATION_SPRAY | RESPIRATORY_TRACT | Status: DC | PRN

## 2016-04-13 MED ORDER — HYDROCODONE-ACETAMINOPHEN 5-325 MG PO TABS
1.0000 | ORAL_TABLET | Freq: Four times a day (QID) | ORAL | Status: DC | PRN
  Administered 2016-04-13 – 2016-04-14 (×5): 1 via ORAL
  Filled 2016-04-13 (×5): qty 1

## 2016-04-13 MED ORDER — SODIUM CHLORIDE 0.9 % IV BOLUS (SEPSIS): 500.0000 mL | Freq: Once | INTRAVENOUS | Status: AC

## 2016-04-13 MED ORDER — SODIUM CHLORIDE 0.9 % IV BOLUS (SEPSIS)
500.0000 mL | Freq: Once | INTRAVENOUS | Status: AC
Start: 2016-04-13 — End: 2016-04-13

## 2016-04-13 MED ORDER — SODIUM CHLORIDE 0.9 % IV BOLUS (SEPSIS)
500.0000 mL | Freq: Once | INTRAVENOUS | Status: AC
Start: 2016-04-13 — End: 2016-04-13
  Administered 2016-04-13: 500 mL via INTRAVENOUS

## 2016-04-13 MED ORDER — ALBUTEROL SULFATE (2.5 MG/3ML) 0.083% IN NEBU: 2.5000 mg | INHALATION_SOLUTION | RESPIRATORY_TRACT | Status: DC | PRN

## 2016-04-13 MED ORDER — ONDANSETRON HCL 4 MG/2ML IJ SOLN: 4.0000 mg | Freq: Four times a day (QID) | INTRAMUSCULAR | Status: DC | PRN

## 2016-04-13 MED ORDER — INSULIN GLARGINE 100 UNIT/ML ~~LOC~~ SOLN
40.0000 [IU] | Freq: Two times a day (BID) | SUBCUTANEOUS | Status: DC
  Administered 2016-04-13 – 2016-04-14 (×3): 40 [IU] via SUBCUTANEOUS
  Filled 2016-04-13 (×7): qty 0.4

## 2016-04-13 MED ORDER — MONTELUKAST SODIUM 10 MG PO TABS
10.0000 mg | ORAL_TABLET | Freq: Every day | ORAL | Status: DC
  Administered 2016-04-14 – 2016-04-16 (×3): 10 mg via ORAL
  Filled 2016-04-13 (×3): qty 1

## 2016-04-13 MED ORDER — INSULIN ASPART 100 UNIT/ML ~~LOC~~ SOLN
0.0000 [IU] | Freq: Three times a day (TID) | SUBCUTANEOUS | Status: DC
  Administered 2016-04-13: 5 [IU] via SUBCUTANEOUS
  Administered 2016-04-15: 3 [IU] via SUBCUTANEOUS
  Administered 2016-04-15 – 2016-04-16 (×3): 5 [IU] via SUBCUTANEOUS

## 2016-04-13 MED ORDER — HYDROMORPHONE HCL 1 MG/ML IJ SOLN
1.0000 mg | Freq: Once | INTRAMUSCULAR | Status: AC
  Administered 2016-04-13: 1 mg via INTRAVENOUS
  Filled 2016-04-13: qty 1

## 2016-04-13 MED ORDER — LEVOTHYROXINE SODIUM 25 MCG PO TABS
137.0000 ug | ORAL_TABLET | Freq: Every day | ORAL | Status: DC
  Administered 2016-04-14 – 2016-04-16 (×3): 137 ug via ORAL
  Filled 2016-04-13 (×5): qty 1

## 2016-04-13 MED ORDER — ONDANSETRON HCL 4 MG/2ML IJ SOLN
4.0000 mg | Freq: Once | INTRAMUSCULAR | Status: AC
  Administered 2016-04-13: 4 mg via INTRAVENOUS
  Filled 2016-04-13: qty 2

## 2016-04-13 MED ORDER — INSULIN ASPART 100 UNIT/ML ~~LOC~~ SOLN
0.0000 [IU] | Freq: Every day | SUBCUTANEOUS | Status: DC
  Administered 2016-04-15: 2 [IU] via SUBCUTANEOUS

## 2016-04-13 MED ORDER — MOMETASONE FURO-FORMOTEROL FUM 100-5 MCG/ACT IN AERO
2.0000 | INHALATION_SPRAY | Freq: Two times a day (BID) | RESPIRATORY_TRACT | Status: DC
  Administered 2016-04-15 – 2016-04-16 (×4): 2 via RESPIRATORY_TRACT
  Filled 2016-04-13 (×2): qty 8.8

## 2016-04-13 MED ORDER — INSULIN ASPART 100 UNIT/ML ~~LOC~~ SOLN
8.0000 [IU] | Freq: Three times a day (TID) | SUBCUTANEOUS | Status: DC
  Administered 2016-04-13: 8 [IU] via SUBCUTANEOUS

## 2016-04-13 NOTE — ED Notes (Signed)
Hospitalist in to assess

## 2016-04-13 NOTE — ED Notes (Signed)
Report to [facility]

## 2016-04-13 NOTE — ED Notes (Addendum)
Pt daughter reports, "We went to Dr Mort Sawyers office and he told us to refuse to leave until she was admitted". Pt here for third visit- she is morbidly obese and appears to be unable to move and assist in her care. Her daughter cares for her in her home and is very angry, anxious and attentive to pt.  This RN frequently reassures, listens and encourages questions- providing answers when available. Family is advised of flu precautions with encouragement to use care when touching this RN and using hand sanitizer when able to prevent flu IV, labs, meds and warm blankets provided

## 2016-04-13 NOTE — Progress Notes (Signed)
Patient ID: Ashley Caldwell, female   DOB: 11/29/1940, 76 y.o.   MRN: [id number]  Chief Complaint  Patient presents with  . Shoulder Pain    ER follow up on proximal humerus fracture, DOI 04-11-08.    HPI Ashley Caldwell is a 76 y.o. female.  Presented to [place] with a proximal humerus fracture of the right upper extremity after fall on 2 days ago. She's been to the ER twice. She comes in with her daughter extremely upset because the patient is in severe pain in her right arm lower back bilateral legs left chest wall. She was told at the hospital that she could not be admitted for the humerus fracture  However her condition has definitely deteriorated to where she Is a total self-care maximum assist out of bed she needs maximum assist to go to the bathroom and her pain is not controlled  Review of Systems Review of Systems  Constitutional: Negative for fever.  Respiratory: Negative for chest tightness and shortness of breath.   Cardiovascular: Negative for chest pain.  Musculoskeletal: Positive for arthralgias, back pain, gait problem and myalgias.  Neurological: Negative for numbness.     Past Medical History:  Diagnosis Date  . Arthritis   . Asthma   . CFS (chronic fatigue syndrome)   . Compression fracture of vertebral column (HCC) 01/16/2011   per CXR  . DDD (degenerative disc disease), lumbar 01/18/2016  . Diabetes mellitus   . Elevated cholesterol 01/18/2016  . Fibromyalgia 01/18/2016  . Fibromyositis   . Hashimoto's thyroiditis   . HTN (hypertension), benign   . Hypoglycemia associated with diabetes (HCC) 01/16/2011  . Morbid obesity (HCC)   . Osteoarthritis of both knees 01/18/2016  . Pneumonia 12/2010  . Psoriasis 01/18/2016  . Psoriatic arthritis (HCC)   . Thyroid disease     Past Surgical History:  Procedure Laterality Date  . ABDOMINAL HYSTERECTOMY    . ABDOMINAL SURGERY     lapband  . CATARACT EXTRACTION    . KNEE SURGERY      Social History Social History   Substance Use Topics  . Smoking status: Former Smoker    Years: 20.00    Types: Cigarettes    Quit date: 01/19/1991  . Smokeless tobacco: Never Used  . Alcohol use No    Allergies  Allergen Reactions  . Amoxicillin-Pot Clavulanate Nausea And Vomiting  . Lyrica [Pregabalin]     No outpatient prescriptions have been marked as taking for the 04/13/16 encounter (Office Visit) with Vickki Hearing, MD.      Physical Exam Physical Exam BP (!) 78/49   Pulse 91   Ht 5\' 7"  (1.702 m)   BMI 46.99 kg/m   Gen. appearance. The patient is well-developed and well-nourished, grooming and hygiene are normal. There are no gross congenital abnormalities, She is crying and an emotional wreck  The patient is alert and oriented to person place and time  Mood depressed and anxious  Ambulation cannot ambulate presented on a stretcher   Examination reveals the following: On inspection we find arm in a sling on the right  With the range of motion of  no detectable motion in the right shoulder but the wrist and hand are normal  Stability tests were deferred because of pain in the right shoulder  Motor exam showed normal muscle tone in all 4 extremities   No lacerations were visible on the skin  Sensation remains intact all 4 extremities   vascular pulses are  intact  Data Reviewed 2 views proximal humerus show angulated proximal humerus fracture with osteopenia  Assessment    Proximal humerus fracture right arm    Plan    She is not a surgical candidate she will have to be seen again in the emergency room and hopefully they will admit her for social reasons of inability to walk inability to care for herself and adequate home support  As far as the shoulder goes nonoperative treatment is recommended based on the patient's overall condition medical problems and bone quality       Fuller Canada 04/13/2016, 10:10 AM

## 2016-04-13 NOTE — ED Triage Notes (Signed)
Pt brought in by ems from Dr. Mort Sawyers office.  Pt was here yesterday for fall with fx humerus and dislocated shoulder.  Dr. Romeo Apple advised ems to bring pt here for evaluation.

## 2016-04-13 NOTE — H&P (Signed)
TRH H&P   Patient Demographics:    Ashley Caldwell, is a 76 y.o. female  MRN: [id number]   DOB - Sep 13, 1940  Admit Date - 04/13/2016  Outpatient Primary MD for the patient is [name], MD  Outpatient Specialists: Dr Jonathon Resides    Patient coming from: Home  Chief Complaint  Patient presents with  . Shoulder Pain      HPI:    Ashley Caldwell  is a 76 y.o. 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 Romeo Apple for follow-up.  Dr. Mort Sawyers office patient was found to be in a poor state of health, she appeared unkept and dehydrated, reportedly she was unable to walk and care for herself due to her arm being in sling. She came to the ER where she was found to be hypotensive, severely dehydrated and in acute renal failure. I was called to admit.  Patient says that currently she has no headache, no fever or chills no chest pain or palpitations, no cough or shortness of breath, denies any abdominal pain says she might be constipated, no dysuria, no blood in stool or urine. No focal weakness. She says she is trying to eat and drink well. She does have some dull ache in her right arm area where she incurred a fracture and does have generalized weakness. Relatively negative review of systems.    Review of systems:    In addition to the HPI above,   No Fever-chills, No Headache, No changes with Vision or hearing, No problems swallowing food or  Liquids, No Chest pain, Cough or Shortness of Breath, No Abdominal pain, No Nausea or Vommitting, Bowel movements are regular, No Blood in stool or Urine, No dysuria, No new skin rashes or bruises, No new joints pains-aches, Except dull aching in the right arm, No new weakness, tingling, numbness in any extremity, does have some generalized weakness No recent weight gain or loss, No polyuria, polydypsia or polyphagia, No significant Mental Stressors.  A full 10 point Review of Systems was done, except as stated above, all other Review of  Systems were negative.   With Past History of the following :    Past Medical History:  Diagnosis Date  . Arthritis   . Asthma   . CFS (chronic fatigue syndrome)   . Compression fracture of vertebral column (HCC) 01/16/2011   per CXR  . DDD (degenerative disc disease), lumbar 01/18/2016  . Diabetes mellitus   . Elevated cholesterol 01/18/2016  . Fibromyalgia 01/18/2016  . Fibromyositis   . Hashimoto's thyroiditis   . HTN (hypertension), benign   . Hypoglycemia associated with diabetes (HCC) 01/16/2011  . Morbid obesity (HCC)   . Osteoarthritis of both knees 01/18/2016  . Pneumonia 12/2010  . Psoriasis 01/18/2016  . Psoriatic arthritis (HCC)   . Thyroid disease       Past Surgical History:  Procedure Laterality Date  . ABDOMINAL HYSTERECTOMY    . ABDOMINAL SURGERY     lapband  . CATARACT EXTRACTION    . KNEE SURGERY        Social History:     Social History  Substance Use Topics  . Smoking status: Former Smoker    Years: 20.00    Types: Cigarettes    Quit date: 01/19/1991  . Smokeless tobacco: Never Used  . Alcohol use No         Family History :     Family History  Problem Relation Age of Onset  . Diabetes Father   . Hypertension Mother   . Stroke Mother   . Cancer Brother   . Cancer Brother        Home Medications:   Prior to Admission medications   Medication Sig Start Date End Date Taking? Authorizing  Provider  cetirizine (ZYRTEC) 10 MG tablet Take 10 mg by mouth daily.     Yes Historical Provider, MD  clobetasol cream (TEMOVATE) 0.05 % Apply 1 application topically 2 (two) times daily as needed. 01/19/16  Yes Naitik Panwala, PA-C  cyclobenzaprine (FLEXERIL) 10 MG tablet Take 1 tablet (10 mg total) by mouth at bedtime. Patient taking differently: Take 10 mg by mouth 3 (three) times daily as needed for muscle spasms.  01/19/16 07/17/16 Yes Naitik Panwala, PA-C  DULoxetine (CYMBALTA) 60 MG capsule Take 1 capsule (60 mg total) by mouth 2 (two) times daily. 01/19/16 07/17/16 Yes Naitik Panwala, PA-C  ADVAIR DISKUS 250-50 MCG/DOSE AEPB Inhale 1 puff into the lungs 2 (two) times daily. 04/04/16   Historical Provider, MD  albuterol (PROVENTIL HFA;VENTOLIN HFA) 108 (90 BASE) MCG/ACT inhaler Inhale 2 puffs into the lungs every 4 (four) hours as needed for wheezing or shortness of breath. 01/16/11 01/16/12  Elliot Cousin, MD  BYDUREON 2 MG SRER  11/18/15   Historical Provider, MD  folic acid (FOLVITE) 1 MG tablet Take 2 tablets (2 mg total) by mouth daily. 01/19/16 04/13/17  Naitik Panwala, PA-C  furosemide (LASIX) 20 MG tablet Take 20 mg by mouth.    Historical Provider, MD  hydrochlorothiazide (MICROZIDE) 12.5 MG capsule Take 12.5 mg by mouth daily.    Historical Provider, MD  insulin glargine (LANTUS) 100 UNIT/ML injection Inject 80 Units into the skin at bedtime. Patient not taking: Reported on 01/19/2016 01/16/11   Elliot Cousin, MD  insulin lispro (HUMALOG) 100 UNIT/ML injection Inject 20 Units into the skin 3 (three) times daily before meals.      Historical Provider, MD  KLOR-CON SPRINKLE 10 MEQ CR capsule  11/29/15   Historical Provider, MD  levothyroxine (SYNTHROID, LEVOTHROID) 137 MCG tablet Take 137 mcg by mouth  daily.      Historical Provider, MD  lisinopril (PRINIVIL,ZESTRIL) 30 MG tablet Take 30 mg by mouth daily.      Historical Provider, MD  methotrexate (RHEUMATREX) 2.5 MG tablet TAKE 8 TABLETS EACH  WEEK. 03/07/16   Pollyann Savoy, MD  montelukast (SINGULAIR) 10 MG tablet Take 10 mg by mouth daily.      Historical Provider, MD  nystatin (MYCOSTATIN/NYSTOP) powder  01/05/16   Historical Provider, MD  oxyCODONE (OXY IR/ROXICODONE) 5 MG immediate release tablet Take 5 mg by mouth 3 (three) times daily.      Historical Provider, MD  potassium chloride (K-DUR) 10 MEQ tablet Take 10 mEq by mouth daily.    Historical Provider, MD  TRESIBA FLEXTOUCH 200 UNIT/ML [facility]  11/29/15   Historical Provider, MD  zolpidem (AMBIEN) 10 MG tablet Take 1 tablet (10 mg total) by mouth at bedtime. 01/19/16   Naitik Panwala, PA-C     Allergies:     Allergies  Allergen Reactions  . Amoxicillin-Pot Clavulanate Nausea And Vomiting  . Lyrica [Pregabalin]      Physical Exam:   Vitals  Blood pressure 100/83, pulse 92, temperature 98 F (36.7 C), temperature source Oral, resp. rate 22, height 5\' 7"  (1.702 m), weight 136.1 kg (300 lb), SpO2 100 %.   1. General Morbidly obese elderly white female lying in bed in NAD, appears dehydrated,  2. Normal affect and insight, Not Suicidal or Homicidal, Awake Alert, Oriented X 3.  3. No F.N deficits, ALL C.Nerves Intact, Strength 5/5 all 4 extremities, Sensation intact all 4 extremities, Plantars down going.  4. Ears and Eyes appear Normal, Conjunctivae clear, PERRLA. Dry Oral Mucosa.  5. Supple Neck, No JVD, No cervical lymphadenopathy appriciated, No Carotid Bruits.  6. Symmetrical Chest wall movement, Good air movement bilaterally, CTAB.  7. RRR, No Gallops, Rubs , positive aortic systolic murmur, No Parasternal Heave.  8. Positive Bowel Sounds, Abdomen Soft, No tenderness, No organomegaly appriciated,No rebound -guarding or rigidity.  9.  No Cyanosis, Normal Skin Turgor, No Skin Rash or Bruise.  10. Good muscle tone,  joints appear normal , no effusions, Normal ROM. Right arm in sling  11. No Palpable Lymph Nodes in Neck or Axillae      Data Review:     CBC  Recent Labs Lab 04/13/16 1101  WBC 13.2*  HGB 12.9  HCT 38.0  PLT 244  MCV 94.8  MCH 32.2  MCHC 33.9  RDW 14.1  LYMPHSABS 1.1  MONOABS 1.5*  EOSABS 0.1  BASOSABS 0.0   ------------------------------------------------------------------------------------------------------------------  Chemistries   Recent Labs Lab 04/13/16 1101  NA 127*  K 4.6  CL 90*  CO2 26  GLUCOSE 222*  BUN 43*  CREATININE 3.42*  CALCIUM 9.4   ------------------------------------------------------------------------------------------------------------------ estimated creatinine clearance is 21.1 mL/min (by C-G formula based on SCr of 3.42 mg/dL (H)). ------------------------------------------------------------------------------------------------------------------ No results for input(s): TSH, T4TOTAL, T3FREE, THYROIDAB in the last 72 hours.  Invalid input(s): FREET3  Coagulation profile No results for input(s): INR, PROTIME in the last 168 hours. ------------------------------------------------------------------------------------------------------------------- No results for input(s): DDIMER in the last 72 hours. -------------------------------------------------------------------------------------------------------------------  Cardiac Enzymes No results for input(s): CKMB, TROPONINI, MYOGLOBIN in the last 168 hours.  Invalid input(s): CK ------------------------------------------------------------------------------------------------------------------ No results found for: BNP   ---------------------------------------------------------------------------------------------------------------  Urinalysis    Component Value Date/Time   COLORURINE YELLOW 01/13/2011 1819   APPEARANCEUR HAZY (A) 01/13/2011 1819   LABSPEC 1.025 01/13/2011 1819   PHURINE 5.5 01/13/2011 1819   GLUCOSEU 500 (  A) 01/13/2011 1819   HGBUR NEGATIVE 01/13/2011 1819   BILIRUBINUR NEGATIVE 01/13/2011 1819    KETONESUR TRACE (A) 01/13/2011 1819   PROTEINUR TRACE (A) 01/13/2011 1819   UROBILINOGEN 0.2 01/13/2011 1819   NITRITE NEGATIVE 01/13/2011 1819   LEUKOCYTESUR NEGATIVE 01/13/2011 1819    ----------------------------------------------------------------------------------------------------------------   Imaging Results:    Dg Ribs Unilateral W/chest Left  Result Date: 04/12/2016 CLINICAL DATA:  RIGHT lower leg pain, LEFT rib pain at breast area, bruising at LEFT ribs, slipped and fell 2 days ago, known RIGHT humeral fracture post fall, diabetes mellitus, hypertension calf EXAM: LEFT RIBS AND CHEST - 3+ VIEW COMPARISON:  Chest radiograph 01/13/2011 FINDINGS: Enlargement of cardiac silhouette. Tortuous aorta. Mediastinal contours and hilar vascularity normal. Chronic bronchitic changes without pulmonary infiltrate, pleural effusion or pneumothorax. Slight chronic accentuation of LEFT basilar markings appears unchanged. Diffuse osseous demineralization. Rib assessment is limited by the degree of osseous demineralization. No definite rib fractures of bone destruction. Prior laparoscopic gastric banding. IMPRESSION: Enlargement of cardiac silhouette. Minimal chronic lung changes without acute infiltrate. Osseous demineralization without definite LEFT rib fracture. Electronically Signed   By: Ulyses Southward M.D.   On: 04/12/2016 14:04   Dg Tibia/fibula Right  Result Date: 04/12/2016 CLINICAL DATA:  Right lower leg pain EXAM: RIGHT TIBIA AND FIBULA - 2 VIEW COMPARISON:  None. FINDINGS: No fracture. No worrisome lytic or sclerotic bony abnormality. Advanced degenerative changes are noted in the knee. IMPRESSION: No acute findings Electronically Signed   By: Kennith Center M.D.   On: 04/12/2016 14:04        Assessment & Plan:     1. Severe dehydration, hypotension, ARF. Caused by a combination of diuretics, ACE inhibitor and possibly poor oral intake. She will be admitted, will obtain urine electrolytes,  renal ultrasound, bladder scan to rule out obstruction, IV fluid bolus followed by maintenance. Repeat BMP in the morning. Hold offending medications.  2. Recent mechanical fall with right humerus fracture. Arm in sling, pain control, outpatient or to follow-up with Dr. Romeo Apple.  3. Asthma. Stable. No acute issues, as needed nebulizer treatments and oxygen treatment.  4. Fibromyalgia. Supportive care, minimize sedating medications.  6. Generalized weakness and deconditioning. PT OT eval, may require placement.  7. Hypothyroidism. Continue Synthroid. Check TSH.  8. Morbid obesity. Follow with PCP for weight loss.   9. DM type II. Check A1c, continue Lantus along with pre-meal NovoLog (less than home dose) add sliding scale.   DVT Prophylaxis Heparin    AM Labs Ordered, also please review Full Orders  Family Communication: Admission, patients condition and plan of care including tests being ordered have been discussed with the patient  who indicates understanding and agree with the plan and Code Status.  Code Status Full  Likely DC to  Home/SNG 2-3 days  Condition Fair  Consults called: None    Admission status: Inpt    Time spent in minutes : 35   Sindhu Nguyen K M.D on 04/13/2016 at 1:31 PM  Between 7am to 7pm - Pager - [phone]. After 7pm go to [email or web address] - password [email or web address]  [facility] - Office  [phone]

## 2016-04-13 NOTE — Evaluation (Signed)
Physical Therapy Evaluation Patient Details Name: Ashley Caldwell MRN: [id number] DOB: 1940-08-07 Today's Date: 04/13/2016   History of Present Illness  76 y.o. 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 Romeo Apple for follow-up.  Dr. Mort Sawyers office patient was found to be in a poor state of health, she appeared unkept and dehydrated, reportedly she was unable to walk and care for herself due to her arm being in sling. She came to the ER where she was found to be hypotensive, severely dehydrated and in acute renal failure. I was called to admit.    Clinical Impression  Pt received in bed, Dtr [name], and family friend also present.  Pt is agreeable to PT evaluation. Pt normally ambulates with a RW, and she requires assistance to get dressed.  She is independent with bathing, however, the dtr states that it is not safe for her to do so because she is so weak.  During PT evaluation, she required Mod A for supine<>sit, and multiple attempts were made to have pt stand up, however despite Max A +2, she was not able to stand.  She was able to perform lateral scoot towards the [facility], and then requires assistance to perform sit<>supine.  At this point she is not safe to return home due to increased level of assistance she requires for all functional mobility, and Dtr [name] states that she is not able to provide this level of care.  Therefore, she is recommended for SNF.    After evaluation, pt's friend waited for PT out in the hallway, and wanted to express concern if the patient does end up going home.  The friend states that the dtr and the patient do not get along and the police were even called out to the residence last night  due to an argument.  Friend also states that there is not much room to move around in the home due to pt buying items off of [employer] shopping channel.      Follow Up Recommendations SNF    Equipment Recommendations       Recommendations for Other Services OT consult     Precautions / Restrictions Precautions Precautions: Fall Precaution Comments: 2 falls due to tripping on area rug.  Required Braces or Orthoses: Sling (R UE) Restrictions Weight Bearing Restrictions: No      Mobility  Bed Mobility Overal bed mobility: Needs Assistance Bed Mobility: Supine to Sit     Supine to sit: HOB elevated;Mod assist     General bed mobility comments: Assist for pt to raise trunk up, and then pt was able to work her buttocks to the EOB with increased time.   Transfers Overall transfer level: Needs assistance Equipment used: Straight cane Transfers: Sit to/from Stand Sit to Stand: Max assist;+2 physical assistance (Attempted, but pt is not able)         General transfer comment: Multiple attempts at sit<>stand with encouragement, however pt was only able to lift buttocks up off the bed a few inches, and she is not able to come into a full standing position at this time.   Ambulation/Gait Ambulation/Gait assistance:  (NA)              Stairs  Wheelchair Mobility    Modified Rankin (Stroke Patients Only)       Balance Overall balance assessment: History of Falls;Needs assistance Sitting-balance support: Single extremity supported;Feet supported Sitting balance-Leahy Scale: Good                                       Pertinent Vitals/Pain Pain Assessment: 0-10 Pain Score: 7  Pain Location: R UE - describes it as  Pain Intervention(s): Limited activity within patient's tolerance;Monitored during session;Premedicated before session    Home Living   Living Arrangements: Children (Dtr [name] lives with her who is on disability and has  mental health challenges.  ) Available Help at Discharge: Available PRN/intermittently Type of Home: House Home Access: Stairs to enter   [employer] of Steps: 4 steps into the house, and 4 steps down to the driveway.  Dtr reports they have to park the car on the lawn because they can't make it to the driveway.  Home Layout: One level Home Equipment: Bedside commode;Shower seat;Walker - 2 wheels;Cane - single point      Prior Function     Gait / Transfers Assistance Needed: Pt normally uses the RW in the house.  She is not able to get out into the community.  She requires assistance from the dtr to get out to the car when she needs to.    ADL's / Homemaking Assistance Needed: Dtr states that recently she has needed to assist her with dressing, Dtr states that she is tries to shower on her own, but she is very weak, and dtr is afraid she is going to fall.    Comments: Pt has a bed that is really high and she does not get up into the bed well.       Hand Dominance        Extremity/Trunk Assessment   Upper Extremity Assessment Upper Extremity Assessment: RUE deficits/detail;Generalized weakness RUE: Unable to fully assess due to immobilization    Lower Extremity Assessment Lower Extremity Assessment: Generalized weakness       Communication      Cognition Arousal/Alertness: Awake/alert Behavior During Therapy: WFL for tasks assessed/performed Overall Cognitive Status: Within Functional Limits for tasks assessed                      General Comments      Exercises     Assessment/Plan    PT Assessment Patient needs continued PT services  PT Problem List Decreased strength;Decreased activity tolerance;Decreased mobility;Decreased balance;Obesity          PT Treatment Interventions DME instruction;Gait training;Functional mobility training;Therapeutic activities;Therapeutic exercise;Balance training;Patient/family education    PT Goals (Current  goals can be found in the Care Plan section)  Acute Rehab PT Goals Patient Stated Goal: To get stronger PT Goal Formulation: With patient/family Time For Goal Achievement: 04/20/16 Potential to Achieve Goals: Fair    Frequency Min 5X/week   Barriers to discharge Decreased caregiver support Pt lives with her dtr who states she is not able to physically or mentally assist her mother anymore.      Co-evaluation               End of Session Equipment Utilized During Treatment: Gait belt Activity Tolerance: Patient limited by fatigue;Patient limited by pain Patient left: in bed;with call bell/phone within reach;with family/visitor present Nurse Communication: Mobility status;Need for lift equipment [place] Neighbor,  RN present and assisted with PT evaluation.  Recommend use of Maxi move for transfers at this time. )    Functional Assessment Tool Used: [facility] AM-PAC "6-clicks"  Functional Limitation: Mobility: Walking and moving around Mobility: Walking and Moving Around Current Status [phone]): At least 60 percent but less than 80 percent impaired, limited or restricted Mobility: Walking and Moving Around Goal Status [phone]): At least 40 percent but less than 60 percent impaired, limited or restricted    Time: 1507-1600 PT Time Calculation (min) (ACUTE ONLY): 53 min   Charges:   PT Evaluation $PT Eval Low Complexity: 1 Procedure PT Treatments $Therapeutic Activity: 8-22 mins $Self Care/Home Management: 23-37   PT G Codes:   PT G-Codes **NOT FOR INPATIENT CLASS** Functional Assessment Tool Used: [employer] "6-clicks"  Functional Limitation: Mobility: Walking and moving around Mobility: Walking and Moving Around Current Status [phone]): At least 60 percent but less than 80 percent impaired, limited or restricted Mobility: Walking and Moving Around Goal Status [phone]): At least 40 percent but less than 60 percent impaired, limited or restricted    Beth  Dom Haverland, PT, DPT X: [phone]

## 2016-04-13 NOTE — ED Notes (Signed)
Daughter requested move from ambulance entrance and go to waiting room for her use of cell phone due to ambulance enroute and will come through the doors she is in front of

## 2016-04-13 NOTE — ED Notes (Signed)
Pt with wet yeasty smell under her pannus

## 2016-04-13 NOTE — ED Notes (Signed)
Pt's daughter, [name], is going home at this time and would like to be called with an update.  Her number is [phone]

## 2016-04-13 NOTE — Patient Instructions (Signed)
Recommend patient go back to the emergency room

## 2016-04-13 NOTE — ED Notes (Signed)
Pt repositioned and comfort measures

## 2016-04-13 NOTE — ED Notes (Signed)
Family members have gone home

## 2016-04-13 NOTE — ED Notes (Signed)
Call to daughter, [phone] Informed of pt room number as well as her nurse at this time She reports that she is on her way

## 2016-04-13 NOTE — ED Notes (Addendum)
Unable to do orthostatic VS due to pt morbid obesity and her inability to assist with movement-  hospitalist is aware

## 2016-04-13 NOTE — ED Notes (Signed)
Pt reports last BM was 2 days ago. 

## 2016-04-13 NOTE — ED Provider Notes (Signed)
[facility]-EMERGENCY DEPT Provider Note   CSN: [id number] Arrival date & time: 04/13/16  1034  By signing my name below, I, Javier Docker, attest that this documentation has been prepared under the direction and in the presence of Lorna Few, MD. Electronically Signed: Javier Docker, ER Scribe. 10/29/2015. 10:54 AM.  History   Chief Complaint Chief Complaint  Patient presents with  . Shoulder Pain    LEVEL 5 CAVEAT FOR SEVERE PAIN.  The history is provided by the patient. No language interpreter was used.    HPI Comments: Ashley Caldwell is a 76 y.o. female who presents to the Emergency Department complaining of continued severe pain in her right shoulder. On Wednesday she was seen in the ED after a fall and was diagnosed with a fractured proximal right humorous. She was seen again yesterday by me due to continued pain and difficulty moving around her house. She saw Dr. Romeo Apple the orthopedist this morning and was sent to the ED due to continued severe pain.   Past Medical History:  Diagnosis Date  . Arthritis   . Asthma   . CFS (chronic fatigue syndrome)   . Compression fracture of vertebral column (HCC) 01/16/2011   per CXR  . DDD (degenerative disc disease), lumbar 01/18/2016  . Diabetes mellitus   . Elevated cholesterol 01/18/2016  . Fibromyalgia 01/18/2016  . Fibromyositis   . Hashimoto's thyroiditis   . HTN (hypertension), benign   . Hypoglycemia associated with diabetes (HCC) 01/16/2011  . Morbid obesity (HCC)   . Osteoarthritis of both knees 01/18/2016  . Pneumonia 12/2010  . Psoriasis 01/18/2016  . Psoriatic arthritis (HCC)   . Thyroid disease     Patient Active Problem List   Diagnosis Date Noted  . Psoriasis 01/18/2016  . Fibromyalgia 01/18/2016  . DDD (degenerative disc disease), lumbar 01/18/2016  . Osteoarthritis of both knees 01/18/2016  . Elevated cholesterol 01/18/2016  . Hypoglycemia associated with diabetes (HCC) 01/16/2011  . Compression fracture  of vertebral column (HCC) 01/16/2011  . Hyponatremia 01/14/2011  . Pneumonia 01/13/2011  . Hyperglycemia 01/13/2011  . Weakness generalized 01/13/2011  . DM type 2 (diabetes mellitus, type 2) (HCC) 01/13/2011  . HTN (hypertension), benign 01/13/2011  . Psoriatic arthritis (HCC) 01/13/2011  . Anemia 01/13/2011  . Thyroiditis, chronic 01/13/2011  . Morbid obesity (HCC) 01/13/2011  . Asthma 01/13/2011    Past Surgical History:  Procedure Laterality Date  . ABDOMINAL HYSTERECTOMY    . ABDOMINAL SURGERY     lapband  . CATARACT EXTRACTION    . KNEE SURGERY      OB History    Gravida Para Term Preterm AB Living             2   SAB TAB Ectopic Multiple Live Births                   Home Medications    Prior to Admission medications   Medication Sig Start Date End Date Taking? Authorizing Provider  albuterol (PROVENTIL HFA;VENTOLIN HFA) 108 (90 BASE) MCG/ACT inhaler Inhale 2 puffs into the lungs every 4 (four) hours as needed for wheezing or shortness of breath. 01/16/11 01/16/12  Elliot Cousin, MD  BYDUREON 2 MG SRER  11/18/15   Historical Provider, MD  cetirizine (ZYRTEC) 10 MG tablet Take 10 mg by mouth daily.      Historical Provider, MD  clobetasol cream (TEMOVATE) 0.05 % Apply 1 application topically 2 (two) times daily as needed. 01/19/16  Naitik Panwala, PA-C  cyclobenzaprine (FLEXERIL) 10 MG tablet Take 1 tablet (10 mg total) by mouth at bedtime. 01/19/16 07/17/16  Naitik Panwala, PA-C  DULoxetine (CYMBALTA) 60 MG capsule Take 1 capsule (60 mg total) by mouth 2 (two) times daily. 01/19/16 07/17/16  Naitik Panwala, PA-C  fluconazole (DIFLUCAN) 150 MG tablet  01/18/16   Historical Provider, MD  Fluticasone-Salmeterol (ADVAIR) 100-50 MCG/DOSE AEPB Inhale 1 puff into the lungs every 12 (twelve) hours.      Historical Provider, MD  folic acid (FOLVITE) 1 MG tablet Take 2 tablets (2 mg total) by mouth daily. 01/19/16 04/13/17  Naitik Panwala, PA-C  furosemide (LASIX) 20 MG tablet Take 20  mg by mouth.    Historical Provider, MD  hydrochlorothiazide (MICROZIDE) 12.5 MG capsule Take 12.5 mg by mouth daily.    Historical Provider, MD  insulin glargine (LANTUS) 100 UNIT/ML injection Inject 80 Units into the skin at bedtime. Patient not taking: Reported on 01/19/2016 01/16/11   Elliot Cousin, MD  insulin lispro (HUMALOG) 100 UNIT/ML injection Inject 20 Units into the skin 3 (three) times daily before meals.      Historical Provider, MD  KLOR-CON SPRINKLE 10 MEQ CR capsule  11/29/15   Historical Provider, MD  levothyroxine (SYNTHROID, LEVOTHROID) 137 MCG tablet Take 137 mcg by mouth daily.      Historical Provider, MD  lisinopril (PRINIVIL,ZESTRIL) 30 MG tablet Take 30 mg by mouth daily.      Historical Provider, MD  methotrexate (RHEUMATREX) 2.5 MG tablet TAKE 8 TABLETS EACH WEEK. 03/07/16   Pollyann Savoy, MD  montelukast (SINGULAIR) 10 MG tablet Take 10 mg by mouth daily.      Historical Provider, MD  nystatin (MYCOSTATIN/NYSTOP) powder  01/05/16   Historical Provider, MD  oxyCODONE (OXY IR/ROXICODONE) 5 MG immediate release tablet Take 5 mg by mouth 3 (three) times daily.      Historical Provider, MD  potassium chloride (K-DUR) 10 MEQ tablet Take 10 mEq by mouth daily.    Historical Provider, MD  TRESIBA FLEXTOUCH 200 UNIT/ML [facility]  11/29/15   Historical Provider, MD  zolpidem (AMBIEN) 10 MG tablet Take 1 tablet (10 mg total) by mouth at bedtime. 01/19/16   Tawni Pummel, PA-C    Family History Family History  Problem Relation Age of Onset  . Diabetes Father   . Hypertension Mother   . Stroke Mother   . Cancer Brother   . Cancer Brother     Social History Social History  Substance Use Topics  . Smoking status: Former Smoker    Years: 20.00    Types: Cigarettes    Quit date: 01/19/1991  . Smokeless tobacco: Never Used  . Alcohol use No     Allergies   Amoxicillin-pot clavulanate and Lyrica [pregabalin]   Review of Systems Review of Systems  Unable to perform  ROS: Other  Severe pain.  Physical Exam Updated Vital Signs BP (!) 101/53   Pulse 81   Temp 98 F (36.7 C) (Oral)   Resp 22   Ht 5\' 7"  (1.702 m)   Wt 300 lb (136.1 kg)   SpO2 94%   BMI 46.99 kg/m   Physical Exam  Constitutional: She is oriented to person, place, and time. She appears well-developed and well-nourished. No distress.  Morbidly obese female. Answers questions slowly without much assurance.   HENT:  Head: Normocephalic and atraumatic.  Eyes: Pupils are equal, round, and reactive to light.  Neck: Neck supple.  Cardiovascular: Normal rate.  Pulmonary/Chest: Effort normal. No respiratory distress.  Musculoskeletal: Normal range of motion.  TTP in proximal right humorous.   Neurological: She is alert and oriented to person, place, and time. Coordination normal.  Skin: Skin is warm and dry. She is not diaphoretic.  Psychiatric: She has a normal mood and affect. Her behavior is normal.  Nursing note and vitals reviewed.   ED Treatments / Results  DIAGNOSTIC STUDIES: Oxygen Saturation is 96% on RA, normal by my interpretation.    COORDINATION OF CARE: 10:47 AM Discussed treatment plan with pt at bedside and pt agreed to plan.  Labs (all labs ordered are listed, but only abnormal results are displayed) Labs Reviewed  CBC WITH DIFFERENTIAL/PLATELET - Abnormal; Notable for the following:       Result Value   WBC 13.2 (*)    Neutro Abs 10.4 (*)    Monocytes Absolute 1.5 (*)    All other components within normal limits  BASIC METABOLIC PANEL - Abnormal; Notable for the following:    Sodium 127 (*)    Chloride 90 (*)    Glucose, Bld 222 (*)    BUN 43 (*)    Creatinine, Ser 3.42 (*)    GFR calc non Af Amer 12 (*)    GFR calc Af Amer 14 (*)    All other components within normal limits  URINALYSIS, ROUTINE W REFLEX MICROSCOPIC    EKG  EKG Interpretation None       Radiology Dg Ribs Unilateral W/chest Left  Result Date: 04/12/2016 CLINICAL DATA:   RIGHT lower leg pain, LEFT rib pain at breast area, bruising at LEFT ribs, slipped and fell 2 days ago, known RIGHT humeral fracture post fall, diabetes mellitus, hypertension calf EXAM: LEFT RIBS AND CHEST - 3+ VIEW COMPARISON:  Chest radiograph 01/13/2011 FINDINGS: Enlargement of cardiac silhouette. Tortuous aorta. Mediastinal contours and hilar vascularity normal. Chronic bronchitic changes without pulmonary infiltrate, pleural effusion or pneumothorax. Slight chronic accentuation of LEFT basilar markings appears unchanged. Diffuse osseous demineralization. Rib assessment is limited by the degree of osseous demineralization. No definite rib fractures of bone destruction. Prior laparoscopic gastric banding. IMPRESSION: Enlargement of cardiac silhouette. Minimal chronic lung changes without acute infiltrate. Osseous demineralization without definite LEFT rib fracture. Electronically Signed   By: Ulyses Southward M.D.   On: 04/12/2016 14:04   Dg Tibia/fibula Right  Result Date: 04/12/2016 CLINICAL DATA:  Right lower leg pain EXAM: RIGHT TIBIA AND FIBULA - 2 VIEW COMPARISON:  None. FINDINGS: No fracture. No worrisome lytic or sclerotic bony abnormality. Advanced degenerative changes are noted in the knee. IMPRESSION: No acute findings Electronically Signed   By: Kennith Center M.D.   On: 04/12/2016 14:04    Procedures Procedures (including critical care time)  Medications Ordered in ED Medications  sodium chloride 0.9 % bolus 500 mL (not administered)  sodium chloride 0.9 % bolus 500 mL (500 mLs Intravenous New Bag/Given 04/13/16 1127)  ondansetron (ZOFRAN) injection 4 mg (4 mg Intravenous Given 04/13/16 1131)  HYDROmorphone (DILAUDID) injection 1 mg (1 mg Intravenous Given 04/13/16 1133)     Initial Impression / Assessment and Plan / ED Course  I have reviewed the triage vital signs and the nursing notes.  Pertinent labs & imaging results that were available during my care of the patient were reviewed  by me and considered in my medical decision making (see chart for details).   Patient is status post right proximal humeral fracture on 04/11/16.  Her pain has been poorly  controlled. She has been eating and drinking. She was reevaluated yesterday in the ED and plans for home health care were made. Today she went to the orthopedic office and then was transferred to the ED. Creatinine noted to be elevated at 3.42.  Sodium 127.  Renal failure could be prerenal secondary to dehydration. Will treat pain, hydrate, admit to general medicine.  Final Clinical Impressions(s) / ED Diagnoses   Final diagnoses:  Acute renal failure, unspecified acute renal failure type (HCC)  Closed fracture of proximal end of right humerus with malunion, unspecified fracture morphology, subsequent encounter  Failure to thrive in adult  Hyponatremia    New Prescriptions New Prescriptions   No medications on file      I personally performed the services described in this documentation, which was scribed in my presence. The recorded information has been reviewed and is accurate.         Donnetta Hutching, MD 04/13/16 1302

## 2016-04-14 LAB — BASIC METABOLIC PANEL
Anion gap: 11 (ref 5–15)
BUN: 50 mg/dL — AB (ref 6–20)
CALCIUM: 8.7 mg/dL — AB (ref 8.9–10.3)
CHLORIDE: 95 mmol/L — AB (ref 101–111)
CO2: 24 mmol/L (ref 22–32)
CREATININE: 2.69 mg/dL — AB (ref 0.44–1.00)
GFR, EST AFRICAN AMERICAN: 19 mL/min — AB (ref >60)
GFR, EST NON AFRICAN AMERICAN: 16 mL/min — AB (ref >60)
Glucose, Bld: 65 mg/dL (ref 65–99)
Potassium: 4 mmol/L (ref 3.5–5.1)
SODIUM: 130 mmol/L — AB (ref 135–145)

## 2016-04-14 LAB — GLUCOSE, CAPILLARY
GLUCOSE-CAPILLARY: 71 mg/dL (ref 65–99)
GLUCOSE-CAPILLARY: 82 mg/dL (ref 65–99)
GLUCOSE-CAPILLARY: 77 mg/dL (ref 65–99)
Glucose-Capillary: 89 mg/dL (ref 65–99)

## 2016-04-14 LAB — CREATININE, URINE, RANDOM: CREATININE, URINE: 303.31 mg/dL

## 2016-04-14 LAB — URINALYSIS, ROUTINE W REFLEX MICROSCOPIC
GLUCOSE, UA: NEGATIVE mg/dL
Hgb urine dipstick: NEGATIVE
LEUKOCYTES UA: NEGATIVE
Nitrite: NEGATIVE
PH: 5 (ref 5.0–8.0)
Protein, ur: NEGATIVE mg/dL
Specific Gravity, Urine: >1.03 — ABNORMAL HIGH (ref 1.005–1.030)

## 2016-04-14 LAB — CBC
HCT: 38.1 % (ref 36.0–46.0)
HEMOGLOBIN: 12.6 g/dL (ref 12.0–15.0)
MCH: 31.8 pg (ref 26.0–34.0)
MCHC: 33.1 g/dL (ref 30.0–36.0)
MCV: 96.2 fL (ref 78.0–100.0)
PLATELETS: 243 10*3/uL (ref 150–400)
RBC: 3.96 MIL/uL (ref 3.87–5.11)
RDW: 14.2 % (ref 11.5–15.5)
WBC: 8.6 10*3/uL (ref 4.0–10.5)

## 2016-04-14 LAB — OSMOLALITY, URINE: Osmolality, Ur: 328 mOsm/kg (ref 300–900)

## 2016-04-14 LAB — HEMOGLOBIN A1C
HEMOGLOBIN A1C: 8.5 % — AB (ref 4.8–5.6)
Mean Plasma Glucose: 197 mg/dL

## 2016-04-14 LAB — SODIUM, URINE, RANDOM: Sodium, Ur: <10 mmol/L

## 2016-04-14 MED ORDER — INSULIN GLARGINE 100 UNIT/ML ~~LOC~~ SOLN
35.0000 [IU] | Freq: Two times a day (BID) | SUBCUTANEOUS | Status: DC
  Administered 2016-04-15 – 2016-04-16 (×3): 35 [IU] via SUBCUTANEOUS
  Filled 2016-04-14 (×6): qty 0.35

## 2016-04-14 MED ORDER — HYDROCODONE-ACETAMINOPHEN 5-325 MG PO TABS
1.0000 | ORAL_TABLET | ORAL | Status: DC | PRN
  Administered 2016-04-14 – 2016-04-16 (×7): 1 via ORAL
  Filled 2016-04-14 (×7): qty 1

## 2016-04-14 MED ORDER — SODIUM CHLORIDE 0.9 % IV SOLN
INTRAVENOUS | Status: DC
  Administered 2016-04-14: 1000 mL via INTRAVENOUS
  Administered 2016-04-14: 700 mL via INTRAVENOUS

## 2016-04-14 MED ORDER — MOMETASONE FURO-FORMOTEROL FUM 100-5 MCG/ACT IN AERO
INHALATION_SPRAY | RESPIRATORY_TRACT | Status: AC
  Filled 2016-04-14: qty 8.8

## 2016-04-14 MED ORDER — INSULIN ASPART 100 UNIT/ML ~~LOC~~ SOLN
3.0000 [IU] | Freq: Three times a day (TID) | SUBCUTANEOUS | Status: DC
  Administered 2016-04-15 – 2016-04-16 (×3): 3 [IU] via SUBCUTANEOUS

## 2016-04-14 MED ORDER — SODIUM CHLORIDE 0.9 % IV BOLUS (SEPSIS)
1000.0000 mL | Freq: Once | INTRAVENOUS | Status: AC
Start: 2016-04-14 — End: 2016-04-14
  Administered 2016-04-14: 1000 mL via INTRAVENOUS

## 2016-04-14 NOTE — Progress Notes (Signed)
PROGRESS NOTE                                                                                                                                                                                                             Patient Demographics:    Ashley Caldwell, is a 76 y.o. female, DOB - [birth year], ZOX:[id number]  Admit date - 04/13/2016   Admitting Physician Leroy Sea, MD  Outpatient Primary MD for the patient is [name], MD  LOS - 1  Chief Complaint  Patient presents with  . Shoulder Pain       Brief Narrative  Ashley Caldwell  is a 76 y.o. 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   Subjective:    Ashley Caldwell today has, No headache, No chest pain, No abdominal pain - No Nausea, No new weakness tingling or numbness, No Cough - SOB. Mild R arm pain.   Assessment  & Plan :     1. Severe dehydration, hypotension, ARF. Caused by a combination of diuretics, ACE inhibitor and poor oral intake. ARF is prerenal, stable renal ultrasound, continue with IV fluid bolus followed and maintenance. Hold offending medications. Currently has Foley catheter as she is severely incontinent and to monitor intake and output.  2. Recent mechanical fall with right humerus fracture. Arm in sling, pain control, outpatient or to follow-up with Dr. Romeo Apple. Will require placement due to poor balance.  3. Asthma. Stable. No acute issues, as needed nebulizer treatments and oxygen treatment.  4. Fibromyalgia. Supportive care, minimize sedating medications.  6. Generalized weakness and deconditioning. PT OT eval, may require placement.  7.  Hypothyroidism. Continue Synthroid. Stable TSH.  8. Morbid obesity. Follow with PCP for weight loss.   9. DM type II.  ? dietary compliance at home as she is requiring much less than home dose Lantus and pre-meal NovoLog, continue sliding scale and monitor CBGs.  Lab Results  Component Value Date   HGBA1C 8.5 (H) 04/13/2016   CBG (last 3)   Recent Labs  04/13/16 1657 04/13/16 2056 04/14/16 0732  GLUCAP 221* 166* 71  Family Communication  :  None  Code Status :  Full  Diet : Diet heart healthy/carb modified Room service appropriate? Yes; Fluid consistency: Thin   Disposition Plan  :  SNF - needs placement  Consults  :  None  Procedures  :    Renal US - stable  DVT Prophylaxis  :  Heparin   Lab Results  Component Value Date   PLT 244 04/13/2016    Inpatient Medications  Scheduled Meds: . cyclobenzaprine  5 mg Oral QHS  . DULoxetine  60 mg Oral BID  . folic acid  2 mg Oral Daily  . heparin  5,000 Units Subcutaneous Q8H  . insulin aspart  0-15 Units Subcutaneous TID WC  . insulin aspart  0-5 Units Subcutaneous QHS  . insulin aspart  8 Units Subcutaneous TID WC  . insulin glargine  40 Units Subcutaneous BID  . levothyroxine  137 mcg Oral QAC breakfast  . mometasone-formoterol  2 puff Inhalation BID  . montelukast  10 mg Oral Daily   Continuous Infusions: . sodium chloride 100 mL/hr at 04/14/16 0922   PRN Meds:.albuterol, HYDROcodone-acetaminophen, ondansetron **OR** ondansetron (ZOFRAN) IV  Antibiotics  :    Anti-infectives    None         Objective:   Vitals:   04/13/16 1743 04/13/16 2148 04/14/16 0456 04/14/16 0541  BP: (!) 114/97 (!) 116/48  (!) 106/38  Pulse: 84 83  68  Resp:  20  20  Temp: 98.3 F (36.8 C) 98.3 F (36.8 C)  97.8 F (36.6 C)  TempSrc: Oral Oral  Oral  SpO2: 92% 94%    Weight:   (!) 150 kg (330 lb 11 oz)   Height: 5\' 7"  (1.702 m)       Wt Readings from Last 3 Encounters:  04/14/16 (!) 150 kg (330 lb 11 oz)    04/12/16 136.1 kg (300 lb)  04/11/16 136.1 kg (300 lb)     Intake/Output Summary (Last 24 hours) at 04/14/16 1002 Last data filed at 04/14/16 3893  Gross per 24 hour  Intake          3596.67 ml  Output              600 ml  Net          2996.67 ml     Physical Exam  Awake Alert, Oriented X 3, No new F.N deficits, Normal affect [place].AT,PERRAL Supple Neck,No JVD, No cervical lymphadenopathy appriciated.  Symmetrical Chest wall movement, Good air movement bilaterally, CTAB RRR,No Gallops,Rubs or new Murmurs, No Parasternal Heave +ve B.Sounds, Abd Soft, No tenderness, No organomegaly appriciated, No rebound - guarding or rigidity. No Cyanosis, Clubbing or edema, No new Rash or bruise, R arm in sling    Data Review:    CBC  Recent Labs Lab 04/13/16 1101  WBC 13.2*  HGB 12.9  HCT 38.0  PLT 244  MCV 94.8  MCH 32.2  MCHC 33.9  RDW 14.1  LYMPHSABS 1.1  MONOABS 1.5*  EOSABS 0.1  BASOSABS 0.0    Chemistries   Recent Labs Lab 04/13/16 1101  NA 127*  K 4.6  CL 90*  CO2 26  GLUCOSE 222*  BUN 43*  CREATININE 3.42*  CALCIUM 9.4   ------------------------------------------------------------------------------------------------------------------ No results for input(s): CHOL, HDL, LDLCALC, TRIG, CHOLHDL, LDLDIRECT in the last 72 hours.  Lab Results  Component Value Date   HGBA1C 8.5 (H) 04/13/2016   ------------------------------------------------------------------------------------------------------------------  Recent Labs  04/13/16 1354  TSH 1.176   ------------------------------------------------------------------------------------------------------------------ No results for input(s): VITAMINB12, FOLATE, FERRITIN, TIBC, IRON, RETICCTPCT in the last 72 hours.  Coagulation profile No results for input(s): INR, PROTIME in the last 168 hours.  No results for input(s): DDIMER in the last 72 hours.  Cardiac Enzymes No results for input(s): CKMB,  TROPONINI, MYOGLOBIN in the last 168 hours.  Invalid input(s): CK ------------------------------------------------------------------------------------------------------------------ No results found for: BNP  Micro Results No results found for this or any previous visit (from the past 240 hour(s)).  Radiology Reports Dg Ribs Unilateral W/chest Left  Result Date: 04/12/2016 CLINICAL DATA:  RIGHT lower leg pain, LEFT rib pain at breast area, bruising at LEFT ribs, slipped and fell 2 days ago, known RIGHT humeral fracture post fall, diabetes mellitus, hypertension calf EXAM: LEFT RIBS AND CHEST - 3+ VIEW COMPARISON:  Chest radiograph 01/13/2011 FINDINGS: Enlargement of cardiac silhouette. Tortuous aorta. Mediastinal contours and hilar vascularity normal. Chronic bronchitic changes without pulmonary infiltrate, pleural effusion or pneumothorax. Slight chronic accentuation of LEFT basilar markings appears unchanged. Diffuse osseous demineralization. Rib assessment is limited by the degree of osseous demineralization. No definite rib fractures of bone destruction. Prior laparoscopic gastric banding. IMPRESSION: Enlargement of cardiac silhouette. Minimal chronic lung changes without acute infiltrate. Osseous demineralization without definite LEFT rib fracture. Electronically Signed   By: Ulyses Southward M.D.   On: 04/12/2016 14:04   Dg Shoulder Right  Result Date: 04/11/2016 CLINICAL DATA:  Right shoulder pain.  Fall. EXAM: RIGHT SHOULDER - 2+ VIEW COMPARISON:  04/11/2016. FINDINGS: Angulated sub calf fracture of the proximal right humerus is present. No definite shoulder dislocation. Severe diffuse osteopenia. IMPRESSION: Angulated subcapital fracture of the of proximal right humerus. Electronically Signed   By: Maisie Fus  Register   On: 04/11/2016 08:23   Dg Tibia/fibula Right  Result Date: 04/12/2016 CLINICAL DATA:  Right lower leg pain EXAM: RIGHT TIBIA AND FIBULA - 2 VIEW COMPARISON:  None. FINDINGS: No  fracture. No worrisome lytic or sclerotic bony abnormality. Advanced degenerative changes are noted in the knee. IMPRESSION: No acute findings Electronically Signed   By: Kennith Center M.D.   On: 04/12/2016 14:04   US Renal  Result Date: 04/13/2016 CLINICAL DATA:  Acute renal failure EXAM: RENAL / URINARY TRACT ULTRASOUND COMPLETE COMPARISON:  None. FINDINGS: Right Kidney: Length: 10.1 cm. Echogenicity within normal limits. No mass or hydronephrosis visualized. Left Kidney: Length: 9.3 cm, difficult to visualize due to body habitus and overlying bowel gas. Echogenicity within normal limits. No mass or hydronephrosis visualized. Bladder: Not distended, grossly unremarkable. IMPRESSION: No acute findings.  No hydronephrosis. Electronically Signed   By: Charlett Nose M.D.   On: 04/13/2016 13:55   Dg Chest Port 1 View  Result Date: 04/13/2016 CLINICAL DATA:  Shortness of Breath EXAM: PORTABLE CHEST 1 VIEW COMPARISON:  04/12/2016 FINDINGS: Cardiomegaly. Mild vascular congestion. Linear scarring in the lingula. Otherwise no confluent opacities. No edema or effusions. No acute bony abnormality. IMPRESSION: Cardiomegaly, vascular congestion. Electronically Signed   By: Charlett Nose M.D.   On: 04/13/2016 13:42   Dg Humerus Right  Result Date: 04/11/2016 CLINICAL DATA:  Fall. EXAM: RIGHT HUMERUS - 2+ VIEW COMPARISON:  Chest x-ray of 01/13/2011. FINDINGS: Angulated subcapital fracture of the prior right humerus noted. No other definite focal abnormalities identified. Diffuse severe osteopenia . IMPRESSION: Angulated subcapital fracture of the prior right humerus. Electronically Signed   By: Maisie Fus  Register   On: 04/11/2016 08:24    Time Spent in minutes  30  Leroy Sea M.D on 04/14/2016 at 10:02 AM  Between 7am to 7pm - Pager - [phone]  After 7pm go to [email or web address] - password [facility]  [facility] -  Office  [phone]

## 2016-04-15 DIAGNOSIS — I10 Essential (primary) hypertension: Secondary | ICD-10-CM

## 2016-04-15 DIAGNOSIS — J4520 Mild intermittent asthma, uncomplicated: Secondary | ICD-10-CM

## 2016-04-15 LAB — GLUCOSE, CAPILLARY
GLUCOSE-CAPILLARY: 201 mg/dL — AB (ref 65–99)
Glucose-Capillary: 78 mg/dL (ref 65–99)
Glucose-Capillary: 149 mg/dL — ABNORMAL HIGH (ref 65–99)
Glucose-Capillary: 295 mg/dL — ABNORMAL HIGH (ref 65–99)

## 2016-04-15 LAB — BASIC METABOLIC PANEL
Anion gap: 5 (ref 5–15)
BUN: 43 mg/dL — AB (ref 6–20)
CO2: 26 mmol/L (ref 22–32)
CREATININE: 1.26 mg/dL — AB (ref 0.44–1.00)
Calcium: 8.7 mg/dL — ABNORMAL LOW (ref 8.9–10.3)
Chloride: 103 mmol/L (ref 101–111)
GFR calc Af Amer: 48 mL/min — ABNORMAL LOW (ref >60)
GFR calc non Af Amer: 41 mL/min — ABNORMAL LOW (ref >60)
Glucose, Bld: 92 mg/dL (ref 65–99)
Potassium: 4.5 mmol/L (ref 3.5–5.1)
SODIUM: 134 mmol/L — AB (ref 135–145)

## 2016-04-15 LAB — URINE CULTURE: Culture: NO GROWTH

## 2016-04-15 MED ORDER — METHYLPREDNISOLONE SODIUM SUCC 125 MG IJ SOLR
60.0000 mg | Freq: Once | INTRAMUSCULAR | Status: AC
  Administered 2016-04-15: 60 mg via INTRAVENOUS
  Filled 2016-04-15: qty 2

## 2016-04-15 MED ORDER — SODIUM CHLORIDE 0.9 % IV BOLUS (SEPSIS)
500.0000 mL | Freq: Once | INTRAVENOUS | Status: AC
  Administered 2016-04-15: 500 mL via INTRAVENOUS

## 2016-04-15 NOTE — Progress Notes (Signed)
PROGRESS NOTE                                                                                                                                                                                                             Patient Demographics:    Ashley Caldwell, is a 76 y.o. female, DOB - Mar 10, 1941, SFK:[id number]  Admit date - 04/13/2016   Admitting Physician Leroy Sea, MD  Outpatient Primary MD for the patient is [name], MD  LOS - 2  Chief Complaint  Patient presents with  . Shoulder Pain       Brief Narrative  Ashley Caldwell  is a 76 y.o. 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   Subjective:    Ashley Caldwell today has, No headache, No chest pain, No abdominal pain - No Nausea, No new weakness tingling or numbness, No Cough - SOB. Mild R arm pain.   Assessment  & Plan :     1. Severe dehydration, hypotension, ARF. Caused by a combination of diuretics, ACE inhibitor and poor oral intake. ARF was prerenal, stable renal ultrasound, Resolved with IVF.Hold offending Meds for now.  2. Recent mechanical fall with right humerus fracture. Arm in sling, pain control, outpatient or to follow-up with Dr. Romeo Apple. Will require placement due to poor balance.  3. Asthma. Stable. No acute issues, as needed nebulizer treatments and oxygen treatment.  4. Fibromyalgia. Supportive care, minimize sedating medications.  6. Generalized weakness and deconditioning. PT OT eval, may require placement.  7. Hypothyroidism. Continue Synthroid. Stable TSH.  8. Morbid obesity. Follow with PCP for weight loss.   9. DM type II.  ? dietary  compliance at home as she is requiring much less than home dose Lantus and pre-meal NovoLog, continue sliding scale and monitor CBGs.  Lab Results  Component Value Date   HGBA1C 8.5 (H) 04/13/2016   CBG (last 3)   Recent Labs  04/14/16 1628 04/14/16 2129 04/15/16 0751  GLUCAP 89 77 78      Family Communication  :  None  Code Status :  Full  Diet :  Diet heart healthy/carb modified Room service appropriate? Yes; Fluid consistency: Thin   Disposition Plan  :  SNF - needs placement - likely Monday  Consults  :  None  Procedures  :    Renal US - stable  DVT Prophylaxis  :  Heparin   Lab Results  Component Value Date   PLT 243 04/14/2016    Inpatient Medications  Scheduled Meds: . cyclobenzaprine  5 mg Oral QHS  . DULoxetine  60 mg Oral BID  . folic acid  2 mg Oral Daily  . heparin  5,000 Units Subcutaneous Q8H  . insulin aspart  0-15 Units Subcutaneous TID WC  . insulin aspart  0-5 Units Subcutaneous QHS  . insulin aspart  3 Units Subcutaneous TID WC  . insulin glargine  35 Units Subcutaneous BID  . levothyroxine  137 mcg Oral QAC breakfast  . mometasone-formoterol  2 puff Inhalation BID  . montelukast  10 mg Oral Daily   Continuous Infusions:  PRN Meds:.albuterol, HYDROcodone-acetaminophen, ondansetron **OR** ondansetron (ZOFRAN) IV  Antibiotics  :    Anti-infectives    None         Objective:   Vitals:   04/14/16 2133 04/15/16 0024 04/15/16 0641 04/15/16 0842  BP: 118/75  119/63   Pulse: 86  74   Resp: 18  18   Temp: 98.5 F (36.9 C)  98.1 F (36.7 C)   TempSrc: Oral  Oral   SpO2: 94% 92% 96% 99%  Weight:   (!) 152.3 kg (335 lb 12.8 oz)   Height:        Wt Readings from Last 3 Encounters:  04/15/16 (!) 152.3 kg (335 lb 12.8 oz)  04/12/16 136.1 kg (300 lb)  04/11/16 136.1 kg (300 lb)     Intake/Output Summary (Last 24 hours) at 04/15/16 1035 Last data filed at 04/15/16 0641  Gross per 24 hour  Intake           3107.5 ml  Output              2000 ml  Net           1107.5 ml     Physical Exam  Awake Alert, Oriented X 3, No new F.N deficits, Normal affect .AT,PERRAL Supple Neck,No JVD, No cervical lymphadenopathy appriciated.  Symmetrical Chest wall movement, Good air movement bilaterally, CTAB RRR,No Gallops,Rubs or new Murmurs, No Parasternal Heave +ve B.Sounds, Abd Soft, No tenderness, No organomegaly appriciated, No rebound - guarding or rigidity. No Cyanosis, Clubbing or edema, No new Rash or bruise, R arm in sling    Data Review:    CBC  Recent Labs Lab 04/13/16 1101 04/14/16 0857  WBC 13.2* 8.6  HGB 12.9 12.6  HCT 38.0 38.1  PLT 244 243  MCV 94.8 96.2  MCH 32.2 31.8  MCHC 33.9 33.1  RDW 14.1 14.2  LYMPHSABS 1.1  --   MONOABS 1.5*  --   EOSABS 0.1  --   BASOSABS 0.0  --     Chemistries   Recent Labs Lab 04/13/16 1101 04/14/16 0857 04/15/16 0722  NA 127* 130* 134*  K 4.6 4.0 4.5  CL 90* 95* 103  CO2 26 24 26   GLUCOSE 222* 65 92  BUN 43* 50* 43*  CREATININE 3.42* 2.69* 1.26*  CALCIUM 9.4 8.7* 8.7*   ------------------------------------------------------------------------------------------------------------------ No results for input(s): CHOL, HDL, LDLCALC, TRIG, CHOLHDL, LDLDIRECT in the last 72 hours.  Lab Results  Component Value Date   HGBA1C 8.5 (  H) 04/13/2016   ------------------------------------------------------------------------------------------------------------------  Recent Labs  04/13/16 1354  TSH 1.176   ------------------------------------------------------------------------------------------------------------------ No results for input(s): VITAMINB12, FOLATE, FERRITIN, TIBC, IRON, RETICCTPCT in the last 72 hours.  Coagulation profile No results for input(s): INR, PROTIME in the last 168 hours.  No results for input(s): DDIMER in the last 72 hours.  Cardiac Enzymes No results for input(s): CKMB, TROPONINI, MYOGLOBIN in the last 168  hours.  Invalid input(s): CK ------------------------------------------------------------------------------------------------------------------ No results found for: BNP  Micro Results Recent Results (from the past 240 hour(s))  Urine culture     Status: None   Collection Time: 04/14/16 12:15 AM  Result Value Ref Range Status   Specimen Description URINE, CATHETERIZED  Final   Special Requests NONE  Final   Culture   Final    NO GROWTH Performed at [facility], [address] [place], [place] [address]    Report Status 04/15/2016 FINAL  Final    Radiology Reports Dg Ribs Unilateral W/chest Left  Result Date: 04/12/2016 CLINICAL DATA:  RIGHT lower leg pain, LEFT rib pain at breast area, bruising at LEFT ribs, slipped and fell 2 days ago, known RIGHT humeral fracture post fall, diabetes mellitus, hypertension calf EXAM: LEFT RIBS AND CHEST - 3+ VIEW COMPARISON:  Chest radiograph 01/13/2011 FINDINGS: Enlargement of cardiac silhouette. Tortuous aorta. Mediastinal contours and hilar vascularity normal. Chronic bronchitic changes without pulmonary infiltrate, pleural effusion or pneumothorax. Slight chronic accentuation of LEFT basilar markings appears unchanged. Diffuse osseous demineralization. Rib assessment is limited by the degree of osseous demineralization. No definite rib fractures of bone destruction. Prior laparoscopic gastric banding. IMPRESSION: Enlargement of cardiac silhouette. Minimal chronic lung changes without acute infiltrate. Osseous demineralization without definite LEFT rib fracture. Electronically Signed   By: Ulyses Southward M.D.   On: 04/12/2016 14:04   Dg Shoulder Right  Result Date: 04/11/2016 CLINICAL DATA:  Right shoulder pain.  Fall. EXAM: RIGHT SHOULDER - 2+ VIEW COMPARISON:  04/11/2016. FINDINGS: Angulated sub calf fracture of the proximal right humerus is present. No definite shoulder dislocation. Severe diffuse osteopenia. IMPRESSION: Angulated subcapital  fracture of the of proximal right humerus. Electronically Signed   By: Maisie Fus  Register   On: 04/11/2016 08:23   Dg Tibia/fibula Right  Result Date: 04/12/2016 CLINICAL DATA:  Right lower leg pain EXAM: RIGHT TIBIA AND FIBULA - 2 VIEW COMPARISON:  None. FINDINGS: No fracture. No worrisome lytic or sclerotic bony abnormality. Advanced degenerative changes are noted in the knee. IMPRESSION: No acute findings Electronically Signed   By: Kennith Center M.D.   On: 04/12/2016 14:04   US Renal  Result Date: 04/13/2016 CLINICAL DATA:  Acute renal failure EXAM: RENAL / URINARY TRACT ULTRASOUND COMPLETE COMPARISON:  None. FINDINGS: Right Kidney: Length: 10.1 cm. Echogenicity within normal limits. No mass or hydronephrosis visualized. Left Kidney: Length: 9.3 cm, difficult to visualize due to body habitus and overlying bowel gas. Echogenicity within normal limits. No mass or hydronephrosis visualized. Bladder: Not distended, grossly unremarkable. IMPRESSION: No acute findings.  No hydronephrosis. Electronically Signed   By: Charlett Nose M.D.   On: 04/13/2016 13:55   Dg Chest Port 1 View  Result Date: 04/13/2016 CLINICAL DATA:  Shortness of Breath EXAM: PORTABLE CHEST 1 VIEW COMPARISON:  04/12/2016 FINDINGS: Cardiomegaly. Mild vascular congestion. Linear scarring in the lingula. Otherwise no confluent opacities. No edema or effusions. No acute bony abnormality. IMPRESSION: Cardiomegaly, vascular congestion. Electronically Signed   By: Charlett Nose M.D.   On: 04/13/2016 13:42  Dg Humerus Right  Result Date: 04/11/2016 CLINICAL DATA:  Fall. EXAM: RIGHT HUMERUS - 2+ VIEW COMPARISON:  Chest x-ray of 01/13/2011. FINDINGS: Angulated subcapital fracture of the prior right humerus noted. No other definite focal abnormalities identified. Diffuse severe osteopenia . IMPRESSION: Angulated subcapital fracture of the prior right humerus. Electronically Signed   By: Maisie Fus  Register   On: 04/11/2016 08:24    Time Spent in  minutes  30   Susa Raring K M.D on 04/15/2016 at 10:35 AM  Between 7am to 7pm - Pager - [phone]  After 7pm go to [email or web address] - password [facility]  [facility] -  Office  [phone]

## 2016-04-15 NOTE — Progress Notes (Signed)
Patients foley DC'd - DTV by 11 pm

## 2016-04-15 NOTE — Progress Notes (Signed)
Patient OOB to chair for approximately 6 hours today.  2 person assist.  Tolerated well.

## 2016-04-16 DIAGNOSIS — Z87448 Personal history of other diseases of urinary system: Secondary | ICD-10-CM | POA: Diagnosis not present

## 2016-04-16 DIAGNOSIS — E1100 Type 2 diabetes mellitus with hyperosmolarity without nonketotic hyperglycemic-hyperosmolar coma (NKHHC): Secondary | ICD-10-CM | POA: Diagnosis not present

## 2016-04-16 DIAGNOSIS — S42292P Other displaced fracture of upper end of left humerus, subsequent encounter for fracture with malunion: Secondary | ICD-10-CM | POA: Diagnosis not present

## 2016-04-16 DIAGNOSIS — E065 Other chronic thyroiditis: Secondary | ICD-10-CM | POA: Diagnosis not present

## 2016-04-16 DIAGNOSIS — L0390 Cellulitis, unspecified: Secondary | ICD-10-CM | POA: Diagnosis not present

## 2016-04-16 DIAGNOSIS — M170 Bilateral primary osteoarthritis of knee: Secondary | ICD-10-CM | POA: Diagnosis not present

## 2016-04-16 DIAGNOSIS — L97519 Non-pressure chronic ulcer of other part of right foot with unspecified severity: Secondary | ICD-10-CM | POA: Diagnosis not present

## 2016-04-16 DIAGNOSIS — H109 Unspecified conjunctivitis: Secondary | ICD-10-CM | POA: Diagnosis not present

## 2016-04-16 DIAGNOSIS — X58XXXD Exposure to other specified factors, subsequent encounter: Secondary | ICD-10-CM | POA: Diagnosis not present

## 2016-04-16 DIAGNOSIS — N179 Acute kidney failure, unspecified: Principal | ICD-10-CM

## 2016-04-16 DIAGNOSIS — B351 Tinea unguium: Secondary | ICD-10-CM | POA: Diagnosis not present

## 2016-04-16 DIAGNOSIS — Z8639 Personal history of other endocrine, nutritional and metabolic disease: Secondary | ICD-10-CM | POA: Diagnosis not present

## 2016-04-16 DIAGNOSIS — R279 Unspecified lack of coordination: Secondary | ICD-10-CM | POA: Diagnosis not present

## 2016-04-16 DIAGNOSIS — E039 Hypothyroidism, unspecified: Secondary | ICD-10-CM | POA: Diagnosis not present

## 2016-04-16 DIAGNOSIS — I10 Essential (primary) hypertension: Secondary | ICD-10-CM | POA: Diagnosis not present

## 2016-04-16 DIAGNOSIS — E669 Obesity, unspecified: Secondary | ICD-10-CM | POA: Diagnosis not present

## 2016-04-16 DIAGNOSIS — S42201D Unspecified fracture of upper end of right humerus, subsequent encounter for fracture with routine healing: Secondary | ICD-10-CM | POA: Diagnosis not present

## 2016-04-16 DIAGNOSIS — S42292A Other displaced fracture of upper end of left humerus, initial encounter for closed fracture: Secondary | ICD-10-CM | POA: Diagnosis not present

## 2016-04-16 DIAGNOSIS — L4050 Arthropathic psoriasis, unspecified: Secondary | ICD-10-CM | POA: Diagnosis not present

## 2016-04-16 DIAGNOSIS — N170 Acute kidney failure with tubular necrosis: Secondary | ICD-10-CM | POA: Diagnosis not present

## 2016-04-16 DIAGNOSIS — M5136 Other intervertebral disc degeneration, lumbar region: Secondary | ICD-10-CM | POA: Diagnosis not present

## 2016-04-16 DIAGNOSIS — G894 Chronic pain syndrome: Secondary | ICD-10-CM | POA: Diagnosis not present

## 2016-04-16 DIAGNOSIS — R1311 Dysphagia, oral phase: Secondary | ICD-10-CM | POA: Diagnosis not present

## 2016-04-16 DIAGNOSIS — S42211A Unspecified displaced fracture of surgical neck of right humerus, initial encounter for closed fracture: Secondary | ICD-10-CM | POA: Diagnosis not present

## 2016-04-16 DIAGNOSIS — J4520 Mild intermittent asthma, uncomplicated: Secondary | ICD-10-CM | POA: Diagnosis not present

## 2016-04-16 DIAGNOSIS — E119 Type 2 diabetes mellitus without complications: Secondary | ICD-10-CM | POA: Diagnosis not present

## 2016-04-16 DIAGNOSIS — Z8709 Personal history of other diseases of the respiratory system: Secondary | ICD-10-CM | POA: Diagnosis not present

## 2016-04-16 DIAGNOSIS — R2681 Unsteadiness on feet: Secondary | ICD-10-CM | POA: Diagnosis not present

## 2016-04-16 DIAGNOSIS — R69 Illness, unspecified: Secondary | ICD-10-CM | POA: Diagnosis not present

## 2016-04-16 DIAGNOSIS — Z79899 Other long term (current) drug therapy: Secondary | ICD-10-CM | POA: Diagnosis not present

## 2016-04-16 DIAGNOSIS — E1140 Type 2 diabetes mellitus with diabetic neuropathy, unspecified: Secondary | ICD-10-CM | POA: Diagnosis not present

## 2016-04-16 DIAGNOSIS — L409 Psoriasis, unspecified: Secondary | ICD-10-CM | POA: Diagnosis not present

## 2016-04-16 DIAGNOSIS — Z7401 Bed confinement status: Secondary | ICD-10-CM | POA: Diagnosis not present

## 2016-04-16 DIAGNOSIS — J302 Other seasonal allergic rhinitis: Secondary | ICD-10-CM | POA: Diagnosis not present

## 2016-04-16 DIAGNOSIS — S42201A Unspecified fracture of upper end of right humerus, initial encounter for closed fracture: Secondary | ICD-10-CM | POA: Diagnosis not present

## 2016-04-16 DIAGNOSIS — Z743 Need for continuous supervision: Secondary | ICD-10-CM | POA: Diagnosis not present

## 2016-04-16 DIAGNOSIS — L600 Ingrowing nail: Secondary | ICD-10-CM | POA: Diagnosis not present

## 2016-04-16 DIAGNOSIS — M2550 Pain in unspecified joint: Secondary | ICD-10-CM | POA: Diagnosis not present

## 2016-04-16 DIAGNOSIS — M545 Low back pain: Secondary | ICD-10-CM | POA: Diagnosis not present

## 2016-04-16 DIAGNOSIS — M6281 Muscle weakness (generalized): Secondary | ICD-10-CM | POA: Diagnosis not present

## 2016-04-16 DIAGNOSIS — M797 Fibromyalgia: Secondary | ICD-10-CM | POA: Diagnosis not present

## 2016-04-16 DIAGNOSIS — Z794 Long term (current) use of insulin: Secondary | ICD-10-CM | POA: Diagnosis not present

## 2016-04-16 DIAGNOSIS — J9811 Atelectasis: Secondary | ICD-10-CM | POA: Diagnosis not present

## 2016-04-16 DIAGNOSIS — K5900 Constipation, unspecified: Secondary | ICD-10-CM | POA: Diagnosis not present

## 2016-04-16 DIAGNOSIS — J45909 Unspecified asthma, uncomplicated: Secondary | ICD-10-CM | POA: Diagnosis not present

## 2016-04-16 DIAGNOSIS — R1312 Dysphagia, oropharyngeal phase: Secondary | ICD-10-CM | POA: Diagnosis not present

## 2016-04-16 DIAGNOSIS — L03031 Cellulitis of right toe: Secondary | ICD-10-CM | POA: Diagnosis not present

## 2016-04-16 DIAGNOSIS — I739 Peripheral vascular disease, unspecified: Secondary | ICD-10-CM | POA: Diagnosis not present

## 2016-04-16 DIAGNOSIS — I872 Venous insufficiency (chronic) (peripheral): Secondary | ICD-10-CM | POA: Diagnosis not present

## 2016-04-16 LAB — GLUCOSE, CAPILLARY
GLUCOSE-CAPILLARY: 248 mg/dL — AB (ref 65–99)
Glucose-Capillary: 238 mg/dL — ABNORMAL HIGH (ref 65–99)

## 2016-04-16 MED ORDER — INSULIN ASPART 100 UNIT/ML ~~LOC~~ SOLN: SUBCUTANEOUS | 12 refills | Status: DC

## 2016-04-16 MED ORDER — CYCLOBENZAPRINE HCL 5 MG PO TABS: 5.0000 mg | ORAL_TABLET | Freq: Every day | ORAL | 0 refills | Status: DC

## 2016-04-16 MED ORDER — INSULIN ASPART 100 UNIT/ML ~~LOC~~ SOLN: SUBCUTANEOUS | 0 refills | Status: AC

## 2016-04-16 MED ORDER — OXYCODONE HCL 5 MG PO TABS: 5.0000 mg | ORAL_TABLET | Freq: Four times a day (QID) | ORAL | 0 refills | Status: DC

## 2016-04-16 MED ORDER — INSULIN GLARGINE 100 UNIT/ML ~~LOC~~ SOLN: 35.0000 [IU] | Freq: Two times a day (BID) | SUBCUTANEOUS | 0 refills | Status: DC

## 2016-04-16 MED ORDER — FUROSEMIDE 20 MG PO TABS: 20.0000 mg | ORAL_TABLET | Freq: Every day | ORAL | Status: DC | PRN

## 2016-04-16 MED ORDER — INSULIN LISPRO 100 UNIT/ML ~~LOC~~ SOLN: 3.0000 [IU] | Freq: Three times a day (TID) | SUBCUTANEOUS | 0 refills | Status: DC

## 2016-04-16 NOTE — Discharge Instructions (Signed)
Follow with Primary MD [name], MD in 7 days   Get CBC, CMP, 2 view Chest X ray checked  by Primary MD or SNF MD in 5-7 days ( we routinely change or add medications that can affect your baseline labs and fluid status, therefore we recommend that you get the mentioned basic workup next visit with your PCP, your PCP may decide not to get them or add new tests based on their clinical decision)   Activity: As tolerated with Full fall precautions use walker/cane & assistance as needed   Disposition SNF   Diet: Heart Healthy Low Carb.  Accuchecks 4 times/day, Once in AM empty stomach and then before each meal. Log in all results and show them to your Prim.MD in 3 days. If any glucose reading is under 80 or above 300 call your Prim MD immidiately. Follow Low glucose instructions for glucose under 80 as instructed.   For Heart failure patients - Check your Weight same time everyday, if you gain over 2 pounds, or you develop in leg swelling, experience more shortness of breath or chest pain, call your Primary MD immediately. Follow Cardiac Low Salt Diet and 1.5 lit/day fluid restriction.   On your next visit with your primary care physician please Get Medicines reviewed and adjusted.   Please request your Prim.MD to go over all Hospital Tests and Procedure/Radiological results at the follow up, please get all Hospital records sent to your Prim MD by signing hospital release before you go home.   If you experience worsening of your admission symptoms, develop shortness of breath, life threatening emergency, suicidal or homicidal thoughts you must seek medical attention immediately by calling 911 or calling your MD immediately  if symptoms less severe.  You Must read complete instructions/literature along with all the possible adverse reactions/side effects for all the Medicines you take and that have been prescribed to you. Take any new Medicines after you have completely understood and accpet  all the possible adverse reactions/side effects.   Do not drive, operate heavy machinery, perform activities at heights, swimming or participation in water activities or provide baby sitting services if your were admitted for syncope or siezures until you have seen by Primary MD or a Neurologist and advised to do so again.  Do not drive when taking Pain medications.    Do not take more than prescribed Pain, Sleep and Anxiety Medications  Special Instructions: If you have smoked or chewed Tobacco  in the last 2 yrs please stop smoking, stop any regular Alcohol  and or any Recreational drug use.  Wear Seat belts while driving.   Please note  You were cared for by a hospitalist during your hospital stay. If you have any questions about your discharge medications or the care you received while you were in the hospital after you are discharged, you can call the unit and asked to speak with the hospitalist on call if the hospitalist that took care of you is not available. Once you are discharged, your primary care physician will handle any further medical issues. Please note that NO REFILLS for any discharge medications will be authorized once you are discharged, as it is imperative that you return to your primary care physician (or establish a relationship with a primary care physician if you do not have one) for your aftercare needs so that they can reassess your need for medications and monitor your lab values.

## 2016-04-16 NOTE — Progress Notes (Signed)
Physical Therapy Treatment Patient Details Name: Ashley Caldwell MRN: [id number] DOB: 17-Jan-1941 Today's Date: 04/16/2016    History of Present Illness 76 y.o. 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 Romeo Apple for follow-up.  Dr. Mort Sawyers office patient was found to be in a poor state of health, she appeared unkept and dehydrated, reportedly she was unable to walk and care for herself due to her arm being in sling. She came to the ER where she was found to be hypotensive, severely dehydrated and in acute renal failure. I was called to admit.    PT Comments    Pt received sitting up in the chair, and pt was agreeable to PT tx.  Pt required Mod A +2 person for sit<>stand at the sink.  She was able to stand for a few minutes to be able to wash her hands and brush her teeth, but fatigued quickly and required seated rest due to increased back pain during activity.  Pt required multiple cues to avoid placing weight through R forearm while standing at the counter.  Continue to recommend SNF due to need for increased assistance with all ADL's, as well as functional mobility activities.  Although she lives with her dtr, the dtr is unable to provide increased level of care at this time due to her own health issues.      Follow Up Recommendations  SNF     Equipment Recommendations  None recommended by PT    Recommendations for Other Services       Precautions / Restrictions Precautions Precautions: Fall Precaution Comments: 2 falls due to tripping on area rug.  Required Braces or Orthoses: Sling (R UE) Restrictions Weight Bearing Restrictions: Yes RUE Weight Bearing: Non weight bearing    Mobility  Bed  Mobility                  Transfers Overall transfer level: Needs assistance Equipment used: None (counter) Transfers: Sit to/from Stand Sit to Stand: Mod assist;+2 physical assistance         General transfer comment: Recliner chair pulled up to face the sink.  Pt used counter to pull up into standing, but pt required Mod A +2 for sit<>Stand transfer.  Pt was able to stand and wash her hands,  and brush her teeth.  She fatigues quickly and expresses need to sit down due to increased back pain.  Pt needed multiple cues while standing at the sink not to put pressure down through R forearm on the counter.  Once she returned to the recliner, she required assistance for washing her underarms and putting on deoderant.    Ambulation/Gait Ambulation/Gait assistance:  (NA due to poor standing tolerarance.  )               Stairs            Wheelchair Mobility    Modified Rankin (Stroke Patients Only)       Balance Overall balance assessment: History of Falls;Needs assistance Sitting-balance support: Single extremity supported;Feet supported Sitting balance-Leahy Scale: Good     Standing balance support: Single extremity supported Standing balance-Leahy Scale: Fair  Cognition Arousal/Alertness: Awake/alert Behavior During Therapy: WFL for tasks assessed/performed Overall Cognitive Status: Within Functional Limits for tasks assessed                      Exercises Donning/doffing sling/immobilizer:  (Total Assist. Continous education given during session regarding RUE precautions and wearing sling. Total assist to donn/doff sling.) Correct positioning of sling/immobilizer:  (Total Assist) Sling wearing schedule (on at all times/off for ADL's):  (Total assist)    General Comments        Pertinent Vitals/Pain Pain Assessment: Faces Faces Pain Scale: Hurts whole lot Pain Location: RUE Pain Intervention(s): Limited  activity within patient's tolerance;Monitored during session;Repositioned    Home Living Family/patient expects to be discharged to:: Skilled nursing facility Living Arrangements: Children Available Help at Discharge: Available PRN/intermittently Type of Home: House Home Access: Stairs to enter   Home Layout: One level Home Equipment: Bedside commode;Shower seat;Walker - 2 wheels;Cane - single point      Prior Function    Gait / Transfers Assistance Needed: Pt normally uses the RW in the house.  She is not able to get out into the community.  She requires assistance from the dtr to get out to the car when she needs to.   ADL's / Homemaking Assistance Needed: Dtr states that recently she has needed to assist her with dressing, Dtr states that she is tries to shower on her own, but she is very weak, and dtr is afraid she is going to fall.   Comments: Pt has a bed that is really high and she does not get up into the bed well.     PT Goals (current goals can now be found in the care plan section) Acute Rehab PT Goals Patient Stated Goal: To get stronger PT Goal Formulation: With patient/family Time For Goal Achievement: 04/20/16 Potential to Achieve Goals: Fair Progress towards PT goals: Progressing toward goals    Frequency    Min 5X/week      PT Plan      Co-evaluation PT/OT/SLP Co-Evaluation/Treatment: Yes Reason for Co-Treatment: Complexity of the patient's impairments (multi-system involvement);Necessary to address cognition/behavior during functional activity;To address functional/ADL transfers PT goals addressed during session: Balance;Mobility/safety with mobility;Strengthening/ROM OT goals addressed during session: ADL's and self-care;Strengthening/ROM     End of Session Equipment Utilized During Treatment: Gait belt Activity Tolerance: Patient limited by fatigue;Patient limited by pain Patient left: with call bell/phone within reach;with family/visitor present;in  chair     Time: 2202-5427 PT Time Calculation (min) (ACUTE ONLY): 21 min  Charges:  $Self Care/Home Management: 8-22                    G Codes:      Beth Gae Bihl, PT, DPT X: [phone]

## 2016-04-16 NOTE — Clinical Social Work Note (Signed)
Clinical Social Work Assessment  Patient Details  Name: Ashley Caldwell MRN: [id number] Date of Birth: Apr 18, 1940  Date of referral:  04/16/16               Reason for consult:  Discharge Planning                Permission sought to share information with:    Permission granted to share information::     Name::        Agency::     Relationship::     Contact Information:  Daughter, [name], was at bedside  Housing/Transportation Living arrangements for the past 2 months:  Single Family Home Source of Information:  Patient Patient Interpreter Needed:  None Criminal Activity/Legal Involvement Pertinent to Current Situation/Hospitalization:  No - Comment as needed Significant Relationships:  Adult Children Lives with:  Adult Children Do you feel safe going back to the place where you live?  Yes Need for family participation in patient care:  Yes (Comment)  Care giving concerns: At baseline, patient's daughter assists her. Daughter, [name], states that she can not provide care for patient in her current condition.    Social Worker assessment / plan:  Patient lives with daughter in patient's home. At baseline, patient uses a rollator, and in independent in most ADLs.  She requires assistance with meal prep.  Patient was agreeable to SNF and [place]  Dr. Jacky Kindle agreed for patient to have a 5 day LOG was facility sought authorization.   Employment status:  Retired Database administrator PT Recommendations:  Skilled Nursing Facility Information / Referral to community resources:  Skilled Nursing Facility  Patient/Family's Response to care: Family is agreeable.   Patient/Family's Understanding of and Emotional Response to Diagnosis, Current Treatment, and Prognosis:  Patient understands her diagnosis, treatment and prognosis.   Emotional Assessment Appearance:    Attitude/Demeanor/Rapport:   (Cooperative/pleasant ) Affect  (typically observed):  Calm, Accepting Orientation:  Oriented to Self, Oriented to Place, Oriented to  Time, Oriented to Situation Alcohol / Substance use:  Not Applicable Psych involvement (Current and /or in the community):  No (Comment)  Discharge Needs  Concerns to be addressed:  Discharge Planning Concerns Readmission within the last 30 days:  No Current discharge risk:  Dependent with Mobility, Lack of support system Barriers to Discharge:  No Barriers Identified   Annice Needy, LCSW 04/16/2016, 1:12 PM

## 2016-04-16 NOTE — NC FL2 (Signed)
[place] MEDICAID FL2 LEVEL OF CARE SCREENING TOOL     IDENTIFICATION  Patient Name: Ashley Caldwell Birthdate: 30-Oct-1940 Sex: female Admission Date (Current Location): 04/13/2016  [facility] and [place] Number:  [employer] and Address:  [facility],  [address], [place] [address]      Provider Number: [id number]  Attending Physician Name and Address:  Leroy Sea, MD  Relative Name and Phone Number:       Current Level of Care: Hospital Recommended Level of Care: Skilled Nursing Facility Prior Approval Number:    Date Approved/Denied:   PASRR Number: [id number] ([id number])  Discharge Plan: SNF    Current Diagnoses: Patient Active Problem List   Diagnosis Date Noted  . Renal failure 04/13/2016  . Acute renal failure (HCC) 04/13/2016  . Psoriasis 01/18/2016  . Fibromyalgia 01/18/2016  . DDD (degenerative disc disease), lumbar 01/18/2016  . Osteoarthritis of both knees 01/18/2016  . Elevated cholesterol 01/18/2016  . Hypoglycemia associated with diabetes (HCC) 01/16/2011  . Compression fracture of vertebral column (HCC) 01/16/2011  . Hyponatremia 01/14/2011  . Pneumonia 01/13/2011  . Hyperglycemia 01/13/2011  . Weakness generalized 01/13/2011  . DM type 2 (diabetes mellitus, type 2) (HCC) 01/13/2011  . HTN (hypertension), benign 01/13/2011  . Psoriatic arthritis (HCC) 01/13/2011  . Anemia 01/13/2011  . Thyroiditis, chronic 01/13/2011  . Morbid obesity (HCC) 01/13/2011  . Asthma 01/13/2011    Orientation RESPIRATION BLADDER Height & Weight     Self, Time, Situation, Place  Normal Continent Weight: (!) 334 lb 11.2 oz (151.8 kg) Height:  5\' 7"  (170.2 cm)  BEHAVIORAL SYMPTOMS/MOOD NEUROLOGICAL BOWEL NUTRITION STATUS      Continent Diet ([place] Healthy/Carb Modified)  AMBULATORY STATUS COMMUNICATION OF NEEDS Skin   Extensive Assist Verbally Normal                       Personal Care Assistance Level of Assistance   Bathing, Feeding, Dressing Bathing Assistance: Maximum assistance Feeding assistance: Independent Dressing Assistance: Maximum assistance     Functional Limitations Info  Sight, Hearing, Speech Sight Info: Adequate Hearing Info: Adequate Speech Info: Adequate    SPECIAL CARE FACTORS FREQUENCY  PT (By licensed PT), OT (By licensed OT)     PT Frequency: 5x/week OT Frequency: 3x/week            Contractures Contractures Info: Not present    Additional Factors Info  Allergies, Psychotropic, Code Status Code Status Info: Full Allergies Info: Amoxicillin-pot [place], Lyrica Psychotropic Info: Cymbalta         Current Medications (04/16/2016):  This is the current hospital active medication list Current Facility-Administered Medications  Medication Dose Route Frequency Provider Last Rate Last Dose  . albuterol (PROVENTIL) (2.5 MG/3ML) 0.083% nebulizer solution 2.5 mg  2.5 mg Nebulization Q4H PRN 04/18/2016, MD      . cyclobenzaprine (FLEXERIL) tablet 5 mg  5 mg Oral QHS Leroy Sea, MD   5 mg at 04/15/16 2155  . DULoxetine (CYMBALTA) DR capsule 60 mg  60 mg Oral BID 2156, MD   60 mg at 04/16/16 1021  . folic acid (FOLVITE) tablet 2 mg  2 mg Oral Daily 04/18/16, MD   2 mg at 04/16/16 1021  . heparin injection 5,000 Units  5,000 Units Subcutaneous Q8H 04/18/16, MD   5,000 Units at 04/16/16 0550  . HYDROcodone-acetaminophen (NORCO/VICODIN) 5-325 MG per tablet 1 tablet  1 tablet  Oral Q4H PRN Leroy Sea, MD   1 tablet at 04/15/16 2247  . insulin aspart (novoLOG) injection 0-15 Units  0-15 Units Subcutaneous TID WC Leroy Sea, MD   5 Units at 04/16/16 1228  . insulin aspart (novoLOG) injection 0-5 Units  0-5 Units Subcutaneous QHS Leroy Sea, MD   2 Units at 04/15/16 2155  . insulin aspart (novoLOG) injection 3 Units  3 Units Subcutaneous TID WC Leroy Sea, MD   3 Units at 04/16/16 1228  . insulin glargine (LANTUS)  injection 35 Units  35 Units Subcutaneous BID Leroy Sea, MD   35 Units at 04/16/16 1021  . levothyroxine (SYNTHROID, LEVOTHROID) tablet 137 mcg  137 mcg Oral QAC breakfast Leroy Sea, MD   137 mcg at 04/16/16 0810  . mometasone-formoterol (DULERA) 100-5 MCG/ACT inhaler 2 puff  2 puff Inhalation BID Leroy Sea, MD   2 puff at 04/16/16 1018  . montelukast (SINGULAIR) tablet 10 mg  10 mg Oral Daily Leroy Sea, MD   10 mg at 04/16/16 1021  . ondansetron (ZOFRAN) tablet 4 mg  4 mg Oral Q6H PRN Leroy Sea, MD       Or  . ondansetron (ZOFRAN) injection 4 mg  4 mg Intravenous Q6H PRN Leroy Sea, MD         Discharge Medications: Please see discharge summary for a list of discharge medications.  Relevant Imaging Results:  Relevant Lab Results:   Additional Information SSN [id number] Juleen China, LCSW

## 2016-04-16 NOTE — Care Management Important Message (Signed)
Important Message  Patient Details  Name: Ashley Caldwell MRN: [id number] Date of Birth: [birth year]   Medicare Important Message Given:  Yes    Malcolm Metro, RN 04/16/2016, 12:50 PM

## 2016-04-16 NOTE — Discharge Summary (Addendum)
CLEATUS ROZEBOOM KJZ:[id number] DOB: 07-May-1940 DOA: 04/13/2016  PCP: [name], MD  Admit date: 04/13/2016  Discharge date: 04/16/2016  Admitted From: Home  Disposition:  SNF   Recommendations for Outpatient Follow-up:   Follow up with PCP in 1-2 weeks  PCP Please obtain BMP/CBC, 2 view CXR in 1week,  (see Discharge instructions)   PCP Please follow up on the following pending results: None   Home Health: None   Equipment/Devices: None  Consultations: None Discharge Condition: Stable   CODE STATUS: Full   Diet Recommendation: Diet heart healthy/carb modified    Chief Complaint  Patient presents with  . Shoulder Pain     Brief history of present illness from the day of admission and additional interim summary    EllynMcCarthyis a 76 y.o.female,With history of incision and dependent type 2 diabetes mellitus, hypertension, morbid obesity, asthma, fibromyalgia, hypothyroidism who lives at home and recently had a mechanical fall a few days ago where she fractured her right humerus, She was admitted from orthopedic office for severe dehydration, hypotension and ARF.                                                                  Hospital Course   1. Severe dehydration, hypotension, ARF. Caused by a combination of diuretics, ACE inhibitor and poor oral intake. ARF was prerenal, stable renal ultrasound, Resolved with IVF., hold offending Meds, now Lasix PRN only  2.Recent mechanical fall with right humerus fracture. Arm in sling, pain control, outpatient or to follow-up with Dr. Romeo Apple. No care at home, daughter is mentally unstable and seeking Psych treatment for herself, Patient has now right arm humeral fracture, she is morbidly obese with deconditioning and weakness, will not be able to live by herself  without much help. Will require placement to SNF due to poor balance and deconditioning.  3.Asthma. Stable. No acute issues, as needed nebulizer treatments and oxygen treatment.  4.Fibromyalgia. Supportive care, minimize sedating medications.  6.Generalized weakness and deconditioning. PT OT eval, will require SNF placement.  7.Hypothyroidism. Continue Synthroid. Stable TSH.  8.Morbid obesity. Follow with PCP for weight loss.   9. DM type II.  ? dietary compliance at home as she is requiring much less insulin than she was using at home, she has been placed on Lantus twice a day along with pre-meal NovoLog and sliding scale as below. Monitor CBGs every before meals at bedtime. Adjust insulin as needed.   Lab Results  Component Value Date   HGBA1C 8.5 (H) 04/13/2016    Discharge diagnosis     Principal Problem:   Acute renal failure (HCC) Active Problems:   DM type 2 (diabetes mellitus, type 2) (HCC)   HTN (hypertension), benign   Thyroiditis, chronic   Morbid obesity (HCC)   Asthma  Fibromyalgia   Renal failure    Discharge instructions    Discharge Instructions    Discharge instructions    Complete by:  As directed    Follow with Primary MD [name], MD in 7 days   Get CBC, CMP, 2 view Chest X ray checked  by Primary MD or SNF MD in 5-7 days ( we routinely change or add medications that can affect your baseline labs and fluid status, therefore we recommend that you get the mentioned basic workup next visit with your PCP, your PCP may decide not to get them or add new tests based on their clinical decision)   Activity: As tolerated with Full fall precautions use walker/cane & assistance as needed   Disposition SNF   Diet: Heart Healthy Low Carb.  Accuchecks 4 times/day, Once in AM empty stomach and then before each meal. Log in all results and show them to your Prim.MD in 3 days. If any glucose reading is under 80 or above 300 call your Prim MD  immidiately. Follow Low glucose instructions for glucose under 80 as instructed.   For Heart failure patients - Check your Weight same time everyday, if you gain over 2 pounds, or you develop in leg swelling, experience more shortness of breath or chest pain, call your Primary MD immediately. Follow Cardiac Low Salt Diet and 1.5 lit/day fluid restriction.   On your next visit with your primary care physician please Get Medicines reviewed and adjusted.   Please request your Prim.MD to go over all Hospital Tests and Procedure/Radiological results at the follow up, please get all Hospital records sent to your Prim MD by signing hospital release before you go home.   If you experience worsening of your admission symptoms, develop shortness of breath, life threatening emergency, suicidal or homicidal thoughts you must seek medical attention immediately by calling 911 or calling your MD immediately  if symptoms less severe.  You Must read complete instructions/literature along with all the possible adverse reactions/side effects for all the Medicines you take and that have been prescribed to you. Take any new Medicines after you have completely understood and accpet all the possible adverse reactions/side effects.   Do not drive, operate heavy machinery, perform activities at heights, swimming or participation in water activities or provide baby sitting services if your were admitted for syncope or siezures until you have seen by Primary MD or a Neurologist and advised to do so again.  Do not drive when taking Pain medications.    Do not take more than prescribed Pain, Sleep and Anxiety Medications  Special Instructions: If you have smoked or chewed Tobacco  in the last 2 yrs please stop smoking, stop any regular Alcohol  and or any Recreational drug use.  Wear Seat belts while driving.   Please note  You were cared for by a hospitalist during your hospital stay. If you have any questions  about your discharge medications or the care you received while you were in the hospital after you are discharged, you can call the unit and asked to speak with the hospitalist on call if the hospitalist that took care of you is not available. Once you are discharged, your primary care physician will handle any further medical issues. Please note that NO REFILLS for any discharge medications will be authorized once you are discharged, as it is imperative that you return to your primary care physician (or establish a relationship with a primary care physician if you do  not have one) for your aftercare needs so that they can reassess your need for medications and monitor your lab values.   Increase activity slowly    Complete by:  As directed       Discharge Medications   Allergies as of 04/16/2016      Reactions   Amoxicillin-pot Clavulanate Nausea And Vomiting   Lyrica [pregabalin]       Medication List    STOP taking these medications   hydrochlorothiazide 12.5 MG capsule Commonly known as:  MICROZIDE   KLOR-CON SPRINKLE 10 MEQ CR capsule Generic drug:  potassium chloride   lisinopril 30 MG tablet Commonly known as:  PRINIVIL,ZESTRIL   potassium chloride 10 MEQ tablet Commonly known as:  K-DUR   TRESIBA FLEXTOUCH 200 UNIT/ML Sopn Generic drug:  Insulin Degludec   zolpidem 10 MG tablet Commonly known as:  AMBIEN     TAKE these medications   ADVAIR DISKUS 250-50 MCG/DOSE Aepb Generic drug:  Fluticasone-Salmeterol Inhale 1 puff into the lungs 2 (two) times daily.   albuterol 108 (90 Base) MCG/ACT inhaler Commonly known as:  PROVENTIL HFA;VENTOLIN HFA Inhale 2 puffs into the lungs every 4 (four) hours as needed for wheezing or shortness of breath.   cetirizine 10 MG tablet Commonly known as:  ZYRTEC Take 10 mg by mouth daily.   clobetasol cream 0.05 % Commonly known as:  TEMOVATE Apply 1 application topically 2 (two) times daily as needed.   cyclobenzaprine 5 MG  tablet Commonly known as:  FLEXERIL Take 1 tablet (5 mg total) by mouth at bedtime. What changed:  medication strength  how much to take   DULoxetine 60 MG capsule Commonly known as:  CYMBALTA Take 1 capsule (60 mg total) by mouth 2 (two) times daily.   folic acid 1 MG tablet Commonly known as:  FOLVITE Take 2 tablets (2 mg total) by mouth daily.   furosemide 20 MG tablet Commonly known as:  LASIX Take 1 tablet (20 mg total) by mouth daily as needed for fluid.   insulin aspart 100 UNIT/ML injection Commonly known as:  NOVOLOG Before each meal 3 times a day, 140-199 - 2 units, 200-250 - 4 units, 251-299 - 6 units,  300-349 - 8 units,  350 or above 10 units. Dispense syringes and needles as needed, Ok to switch to PEN if approved. Substitute to any brand approved. DX DM2, Code E11.65   insulin aspart 100 UNIT/ML injection Commonly known as:  NOVOLOG Before each meal 3 times a day, 140-199 - 2 units, 200-250 - 4 units, 251-299 - 6 units,  300-349 - 8 units,  350 or above 10 units. Dispense syringes and needles as needed, Ok to switch to PEN if approved. Substitute to any brand approved. DX DM2, Code E11.65   insulin glargine 100 UNIT/ML injection Commonly known as:  LANTUS Inject 0.35 mLs (35 Units total) into the skin 2 (two) times daily. What changed:  how much to take  when to take this   insulin lispro 100 UNIT/ML injection Commonly known as:  HUMALOG Inject 0.03 mLs (3 Units total) into the skin 3 (three) times daily before meals. Plus the sliding scale with meals only What changed:  how much to take  additional instructions   levothyroxine 137 MCG tablet Commonly known as:  SYNTHROID, LEVOTHROID Take 137 mcg by mouth daily.   methotrexate 2.5 MG tablet Commonly known as:  RHEUMATREX TAKE 8 TABLETS EACH WEEK.   montelukast 10 MG tablet Commonly known  as:  SINGULAIR Take 10 mg by mouth daily.   nystatin powder Commonly known as:  MYCOSTATIN/NYSTOP Apply 1  g topically daily as needed (yeast infection).   oxyCODONE 5 MG immediate release tablet Commonly known as:  Oxy IR/ROXICODONE Take 1 tablet (5 mg total) by mouth 4 (four) times daily.       Follow-up Information    [name], MD. Schedule an appointment as soon as possible for a visit in 1 week(s).   Specialty:  Internal Medicine Contact information: [address] [place] [address] [phone]        Pollyann Savoy, MD. Schedule an appointment as soon as possible for a visit in 1 week(s).   Specialty:  Rheumatology Why:  arthritis Contact information: [address] [place] [address] [phone]        Fredirick Maudlin, MD. Schedule an appointment as soon as possible for a visit in 1 week(s).   Specialty:  Pulmonary Disease Why:  Sleep study Contact information: [address] [place] [address] [phone]        Fuller Canada, MD. Schedule an appointment as soon as possible for a visit in 1 week(s).   Specialties:  Orthopedic Surgery, Radiology Why:  R Humerus fracture Contact information: [address] [place] [address] [phone]           Major procedures and Radiology Reports - PLEASE review detailed and final reports thoroughly  -       Dg Ribs Unilateral W/chest Left  Result Date: 04/12/2016 CLINICAL DATA:  RIGHT lower leg pain, LEFT rib pain at breast area, bruising at LEFT ribs, slipped and fell 2 days ago, known RIGHT humeral fracture post fall, diabetes mellitus, hypertension calf EXAM: LEFT RIBS AND CHEST - 3+ VIEW COMPARISON:  Chest radiograph 01/13/2011 FINDINGS: Enlargement of cardiac silhouette. Tortuous aorta. Mediastinal contours and hilar vascularity normal. Chronic bronchitic changes without pulmonary infiltrate, pleural effusion or pneumothorax. Slight chronic accentuation of LEFT basilar markings appears unchanged. Diffuse osseous demineralization. Rib assessment is limited by  the degree of osseous demineralization. No definite rib fractures of bone destruction. Prior laparoscopic gastric banding. IMPRESSION: Enlargement of cardiac silhouette. Minimal chronic lung changes without acute infiltrate. Osseous demineralization without definite LEFT rib fracture. Electronically Signed   By: Ulyses Southward M.D.   On: 04/12/2016 14:04   Dg Shoulder Right  Result Date: 04/11/2016 CLINICAL DATA:  Right shoulder pain.  Fall. EXAM: RIGHT SHOULDER - 2+ VIEW COMPARISON:  04/11/2016. FINDINGS: Angulated sub calf fracture of the proximal right humerus is present. No definite shoulder dislocation. Severe diffuse osteopenia. IMPRESSION: Angulated subcapital fracture of the of proximal right humerus. Electronically Signed   By: Maisie Fus  Register   On: 04/11/2016 08:23   Dg Tibia/fibula Right  Result Date: 04/12/2016 CLINICAL DATA:  Right lower leg pain EXAM: RIGHT TIBIA AND FIBULA - 2 VIEW COMPARISON:  None. FINDINGS: No fracture. No worrisome lytic or sclerotic bony abnormality. Advanced degenerative changes are noted in the knee. IMPRESSION: No acute findings Electronically Signed   By: Kennith Center M.D.   On: 04/12/2016 14:04   US Renal  Result Date: 04/13/2016 CLINICAL DATA:  Acute renal failure EXAM: RENAL / URINARY TRACT ULTRASOUND COMPLETE COMPARISON:  None. FINDINGS: Right Kidney: Length: 10.1 cm. Echogenicity within normal limits. No mass or hydronephrosis visualized. Left Kidney: Length: 9.3 cm, difficult to visualize due to body habitus and overlying bowel gas. Echogenicity within normal limits. No mass or hydronephrosis visualized. Bladder: Not  distended, grossly unremarkable. IMPRESSION: No acute findings.  No hydronephrosis. Electronically Signed   By: Charlett Nose M.D.   On: 04/13/2016 13:55   Dg Chest Port 1 View  Result Date: 04/13/2016 CLINICAL DATA:  Shortness of Breath EXAM: PORTABLE CHEST 1 VIEW COMPARISON:  04/12/2016 FINDINGS: Cardiomegaly. Mild vascular congestion.  Linear scarring in the lingula. Otherwise no confluent opacities. No edema or effusions. No acute bony abnormality. IMPRESSION: Cardiomegaly, vascular congestion. Electronically Signed   By: Charlett Nose M.D.   On: 04/13/2016 13:42   Dg Humerus Right  Result Date: 04/11/2016 CLINICAL DATA:  Fall. EXAM: RIGHT HUMERUS - 2+ VIEW COMPARISON:  Chest x-ray of 01/13/2011. FINDINGS: Angulated subcapital fracture of the prior right humerus noted. No other definite focal abnormalities identified. Diffuse severe osteopenia . IMPRESSION: Angulated subcapital fracture of the prior right humerus. Electronically Signed   By: Maisie Fus  Register   On: 04/11/2016 08:24    Micro Results     Recent Results (from the past 240 hour(s))  Urine culture     Status: None   Collection Time: 04/14/16 12:15 AM  Result Value Ref Range Status   Specimen Description URINE, CATHETERIZED  Final   Special Requests NONE  Final   Culture   Final    NO GROWTH Performed at [facility], [address] [place], [place] [address]    Report Status 04/15/2016 FINAL  Final    Today   Subjective    Anashia Rill today has no headache,no chest abdominal pain,no new weakness tingling or numbness, feels much better wants to go home today.     Objective   Blood pressure 106/84, pulse 85, temperature 97.8 F (36.6 C), temperature source Oral, resp. rate 18, height 5\' 7"  (1.702 m), weight (!) 151.8 kg (334 lb 11.2 oz), SpO2 98 %.   Intake/Output Summary (Last 24 hours) at 04/16/16 1014 Last data filed at 04/15/16 1900  Gross per 24 hour  Intake              640 ml  Output              600 ml  Net               40 ml    Exam Awake Alert, Oriented x 3, No new F.N deficits, Normal affect [place].AT,PERRAL Supple Neck,No JVD, No cervical lymphadenopathy appriciated.  Symmetrical Chest wall movement, Good air movement bilaterally, CTAB RRR,No Gallops,Rubs or new Murmurs, No Parasternal Heave +ve B.Sounds, Abd Soft, Non  tender, No organomegaly appriciated, No rebound -guarding or rigidity. No Cyanosis, Clubbing or edema, No new Rash or bruise, R arm in sling   Data Review   CBC w Diff:  Lab Results  Component Value Date   WBC 8.6 04/14/2016   HGB 12.6 04/14/2016   HCT 38.1 04/14/2016   PLT 243 04/14/2016   LYMPHOPCT 9 04/13/2016   MONOPCT 12 04/13/2016   EOSPCT 1 04/13/2016   BASOPCT 0 04/13/2016    CMP:  Lab Results  Component Value Date   NA 134 (L) 04/15/2016   NA 134 (A) 10/27/2015   K 4.5 04/15/2016   CL 103 04/15/2016   CO2 26 04/15/2016   BUN 43 (H) 04/15/2016   BUN 24 (A) 10/27/2015   CREATININE 1.26 (H) 04/15/2016   CREATININE 1.21 (H) 01/19/2016   GLU 277 10/27/2015   PROT 6.7 01/19/2016   ALBUMIN 3.8 01/19/2016   BILITOT 0.9 01/19/2016   ALKPHOS 185 (H)  01/19/2016   AST 35 01/19/2016   ALT 35 (H) 01/19/2016    Total Time in preparing paper work, data evaluation and todays exam - 35 minutes  Leroy Sea M.D on 04/16/2016 at 10:14 AM  [facility]   Office  [phone]

## 2016-04-16 NOTE — Care Management Note (Signed)
Case Management Note  Patient Details  Name: Ashley Caldwell MRN: [id number] Date of Birth: Aug 08, 1940  Subjective/Objective:                  Pt is from home, lives with daughter. Pt seen in ED prior to this admission and was referred for [facility] services. PT not seen by [facility] agency prior to coming back to hospital, unable to be cared for by daughter. PT has recommended placement and CSW working with pt on placement options.   Action/Plan: [facility] made aware of DC plan. Plan for DC to SNF today.   Expected Discharge Date:  04/16/16               Expected Discharge Plan:  Skilled Nursing Facility  In-House Referral:  Clinical Social Work  Discharge planning Services  CM Consult  Post Acute Care Choice:  NA Choice offered to:  NA  Status of Service:  Completed, signed off  Malcolm Metro, RN 04/16/2016, 12:47 PM

## 2016-04-16 NOTE — Evaluation (Signed)
Occupational Therapy Evaluation Patient Details Name: Ashley Caldwell MRN: [id number] DOB: 09/11/1940 Today's Date: 04/16/2016    History of Present Illness 76 y.o. 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 Romeo Apple for follow-up.  Dr. Mort Sawyers office patient was found to be in a poor state of health, she appeared unkept and dehydrated, reportedly she was unable to walk and care for herself due to her arm being in sling. She came to the ER where she was found to be hypotensive, severely dehydrated and in acute renal failure. I was called to admit.   Clinical Impression   Pt sitting up in recliner eating breakfast upon therapy arrival. Pt agreeable to participate in OT evaluation. Recliner was brought up to sink and patient completed grooming tasks while seated and standing. Pt presents with decreased strength and activity tolerance resulting in difficulty completing daily tasks and functional transfers. Patient will benefit from skilled OT services to increase functional performance during daily tasks and increase transfer ability. Recommend patient discharge to SNF.    Follow Up Recommendations  SNF    Equipment Recommendations  None recommended by OT       Precautions / Restrictions Precautions Precautions: Fall Precaution Comments: 2 falls due to tripping on area rug.  Required Braces or Orthoses: Sling (RUE) Restrictions Weight Bearing Restrictions: Yes RUE Weight Bearing: Non weight bearing              ADL Overall ADL's : Needs assistance/impaired     Grooming: Wash/dry hands;Oral care;Applying deodorant;Moderate assistance;Sitting;Standing Grooming Details (indicate cue type and reason):  Pt completed wash/dry hands and oral care while standing. Sat to wash under arms and apply deodorant. Patient required assist to apply deodorant to left arm due to recent right humerus fx. Upper Body Bathing: Moderate assistance;Sitting Upper Body Bathing Details (indicate cue type and reason): Pt complete underarm hygiene. Therapist held RUE up and supported due to recent humerus fx. Patient was able to wash and apply deodorant to RUE with therapist managing RUE. Assist required for LUE due to inability to use RUE at this time.                                 Vision Vision Assessment?: No apparent visual deficits   Perception         Pertinent Vitals/Pain Pain Assessment: Faces Faces Pain Scale: Hurts whole lot Pain Location: RUE Pain Intervention(s): Monitored during session     Hand Dominance Right   Extremity/Trunk Assessment Upper Extremity Assessment Upper Extremity Assessment: RUE deficits/detail;Generalized weakness RUE Deficits / Details: Pt recently sustained a Right humerus fracture. Currently wearing a sling 24/7. Unable to weightbear or use RUE for daily tasks. RUE: Unable to fully assess due to immobilization LUE Deficits / Details: Generalized weakness noted during functional tasks.   Lower Extremity Assessment Lower Extremity Assessment: Defer to PT evaluation       Communication Communication Communication: No difficulties   Cognition Arousal/Alertness: Awake/alert Behavior During Therapy: WFL for tasks assessed/performed Overall Cognitive Status: Within Functional Limits for tasks assessed  Shoulder Instructions Shoulder Instructions Donning/doffing sling/immobilizer:  (Total Assist. Continous education given during session regarding RUE precautions and wearing sling. Total assist to donn/doff sling.) Correct positioning of sling/immobilizer:  (Total Assist) Sling wearing schedule (on at all times/off for  ADL's):  (Total assist)    Home Living Family/patient expects to be discharged to:: Skilled nursing facility Living Arrangements: Children Available Help at Discharge: Available PRN/intermittently Type of Home: House Home Access: Stairs to enter [employer] of Steps: 4 steps into the house, and 4 steps down to the driveway.  Dtr reports they have to park the car on the lawn because they can't make it to the driveway.    Home Layout: One level     Bathroom Shower/Tub: Producer, television/film/video: Handicapped height     Home Equipment: Bedside commode;Shower seat;Walker - 2 wheels;Cane - single point          Prior Functioning/Environment    Gait / Transfers Assistance Needed: Pt normally uses the RW in the house.  She is not able to get out into the community.  She requires assistance from the dtr to get out to the car when she needs to.   ADL's / Homemaking Assistance Needed: Dtr states that recently she has needed to assist her with dressing, Dtr states that she is tries to shower on her own, but she is very weak, and dtr is afraid she is going to fall.     Comments: Pt has a bed that is really high and she does not get up into the bed well.          OT Problem List: Decreased strength;Decreased activity tolerance;Pain;Decreased knowledge of use of DME or AE;Impaired balance (sitting and/or standing);Decreased knowledge of precautions;Impaired UE functional use   OT Treatment/Interventions: Self-care/ADL training;Therapeutic exercise;Energy conservation;DME and/or AE instruction;Patient/family education;Manual therapy;Therapeutic activities    OT Goals(Current goals can be found in the care plan section) Acute Rehab OT Goals Patient Stated Goal: To get stronger OT Goal Formulation: With patient Time For Goal Achievement: 04/30/16 Potential to Achieve Goals: Good  OT Frequency: Min 2X/week   Barriers to D/C: Decreased caregiver support  Daughter reports  that she is unable to provide the appropriate amount of care needed.       Co-evaluation PT/OT/SLP Co-Evaluation/Treatment: Yes Reason for Co-Treatment: For patient/therapist safety;To address functional/ADL transfers;Complexity of the patient's impairments (multi-system involvement)   OT goals addressed during session: ADL's and self-care;Strengthening/ROM      End of Session Equipment Utilized During Treatment: Gait belt  Activity Tolerance: Patient tolerated treatment well Patient left: in chair;with call bell/phone within reach;with family/visitor present   Time: 7782-4235 OT Time Calculation (min): 21 min Charges:  OT General Charges $OT Visit: 1 Procedure OT Evaluation $OT Eval Low Complexity: 1 Procedure OT Treatments $Self Care/Home Management : 8-22 mins G-Codes:    Jeanene Erb 04/16/2016, 9:31 AM  Limmie Patricia, OTR/L,CBIS  [phone]

## 2016-04-16 NOTE — Progress Notes (Signed)
Pt discharged to [facility] at [facility]. Patient discharged with IV removed and site intact. Patient discharged with Prescriptions and discharge paper work.

## 2016-04-16 NOTE — Clinical Social Work Placement (Signed)
   CLINICAL SOCIAL WORK PLACEMENT  NOTE  Date:  04/16/2016  Patient Details  Name: Ashley Caldwell MRN: [id number] Date of Birth: February 15, 1941  Clinical Social Work is seeking post-discharge placement for this patient at the Skilled  Nursing Facility level of care (*CSW will initial, date and re-position this form in  chart as items are completed):  Yes   Patient/family provided with [facility] Clinical Social Work Department's list of facilities offering this level of care within the geographic area requested by the patient (or if unable, by the patient's family).  Yes   Patient/family informed of their freedom to choose among providers that offer the needed level of care, that participate in Medicare, Medicaid or managed care program needed by the patient, have an available bed and are willing to accept the patient.  Yes   Patient/family informed of [facility]'s ownership interest in [facility] and [facility], as well as of the fact that they are under no obligation to receive care at these facilities.  PASRR submitted to EDS on 04/16/16     PASRR number received on 04/16/16     Existing PASRR number confirmed on       FL2 transmitted to all facilities in geographic area requested by pt/family on 04/16/16     FL2 transmitted to all facilities within larger geographic area on       Patient informed that his/her managed care company has contracts with or will negotiate with certain facilities, including the following:        Yes   Patient/family informed of bed offers received.  Patient chooses bed at [facility]     Physician recommends and patient chooses bed at      Patient to be transferred to [facility] [place] on 04/16/16.  Patient to be transferred to facility by Linus Salmons, daughter     Patient family notified on 04/16/16 of transfer.  Name of family member notified:  Reisha Wos, daughter,      PHYSICIAN       Additional Comment:  CSW signing off.    _______________________________________________ Annice Needy, LCSW 04/16/2016, 1:24 PM

## 2016-04-19 ENCOUNTER — Other Ambulatory Visit: Payer: Self-pay | Admitting: *Deleted

## 2016-04-19 DIAGNOSIS — S42201D Unspecified fracture of upper end of right humerus, subsequent encounter for fracture with routine healing: Secondary | ICD-10-CM

## 2016-04-23 ENCOUNTER — Ambulatory Visit: Payer: Medicare HMO | Admitting: Orthopedic Surgery

## 2016-04-23 ENCOUNTER — Ambulatory Visit (HOSPITAL_COMMUNITY)
Admission: RE | Admit: 2016-04-23 | Discharge: 2016-04-23 | Disposition: A | Discharge status: Home / Self Care | Payer: Medicare HMO | Source: Ambulatory Visit | Attending: Orthopedic Surgery | Admitting: Orthopedic Surgery

## 2016-04-23 DIAGNOSIS — E039 Hypothyroidism, unspecified: Secondary | ICD-10-CM | POA: Diagnosis not present

## 2016-04-23 DIAGNOSIS — X58XXXD Exposure to other specified factors, subsequent encounter: Secondary | ICD-10-CM | POA: Insufficient documentation

## 2016-04-23 DIAGNOSIS — M797 Fibromyalgia: Secondary | ICD-10-CM | POA: Diagnosis not present

## 2016-04-23 DIAGNOSIS — N179 Acute kidney failure, unspecified: Secondary | ICD-10-CM | POA: Diagnosis not present

## 2016-04-23 DIAGNOSIS — S42201D Unspecified fracture of upper end of right humerus, subsequent encounter for fracture with routine healing: Secondary | ICD-10-CM | POA: Diagnosis not present

## 2016-04-23 DIAGNOSIS — M6281 Muscle weakness (generalized): Secondary | ICD-10-CM | POA: Diagnosis not present

## 2016-04-23 DIAGNOSIS — J45909 Unspecified asthma, uncomplicated: Secondary | ICD-10-CM | POA: Diagnosis not present

## 2016-04-23 DIAGNOSIS — S42201A Unspecified fracture of upper end of right humerus, initial encounter for closed fracture: Secondary | ICD-10-CM | POA: Diagnosis not present

## 2016-04-23 DIAGNOSIS — K5900 Constipation, unspecified: Secondary | ICD-10-CM | POA: Diagnosis not present

## 2016-04-25 ENCOUNTER — Telehealth: Payer: Self-pay | Admitting: Orthopedic Surgery

## 2016-04-25 NOTE — Telephone Encounter (Signed)
ROUTING TO DR HARRISON TO ADVISE 

## 2016-04-25 NOTE — Telephone Encounter (Signed)
Humberto Seals from [facility] (phone [phone] and fax [phone]) wants [place] to send instructions for PT on this lady.  I told her that Dr. Romeo Apple would not be back in the office until Friday 04-27-16 and that Ms. Ashley Caldwell has an appointment for that day.  She said if possible they still wanted to get instructions for this lady before then.

## 2016-04-26 ENCOUNTER — Ambulatory Visit (INDEPENDENT_AMBULATORY_CARE_PROVIDER_SITE_OTHER): Payer: Medicare HMO | Admitting: Rheumatology

## 2016-04-26 ENCOUNTER — Encounter: Payer: Self-pay | Admitting: Rheumatology

## 2016-04-26 VITALS — BP 132/82 | HR 82 | Resp 16 | Wt 330.0 lb

## 2016-04-26 DIAGNOSIS — L409 Psoriasis, unspecified: Secondary | ICD-10-CM | POA: Diagnosis not present

## 2016-04-26 DIAGNOSIS — L4050 Arthropathic psoriasis, unspecified: Secondary | ICD-10-CM | POA: Diagnosis not present

## 2016-04-26 DIAGNOSIS — M5136 Other intervertebral disc degeneration, lumbar region: Secondary | ICD-10-CM | POA: Diagnosis not present

## 2016-04-26 DIAGNOSIS — Z79899 Other long term (current) drug therapy: Secondary | ICD-10-CM | POA: Diagnosis not present

## 2016-04-26 DIAGNOSIS — Z8639 Personal history of other endocrine, nutritional and metabolic disease: Secondary | ICD-10-CM | POA: Diagnosis not present

## 2016-04-26 DIAGNOSIS — M170 Bilateral primary osteoarthritis of knee: Secondary | ICD-10-CM | POA: Insufficient documentation

## 2016-04-26 DIAGNOSIS — Z8709 Personal history of other diseases of the respiratory system: Secondary | ICD-10-CM | POA: Diagnosis not present

## 2016-04-26 DIAGNOSIS — M797 Fibromyalgia: Secondary | ICD-10-CM | POA: Diagnosis not present

## 2016-04-26 DIAGNOSIS — Z87448 Personal history of other diseases of urinary system: Secondary | ICD-10-CM | POA: Insufficient documentation

## 2016-04-26 NOTE — Progress Notes (Signed)
Office Visit Note  Patient: Ashley Caldwell             Date of Birth: 06/18/1940           MRN: [id number]             PCP: Wende Neighbors, MD Referring: Celene Squibb, MD Visit Date: 04/26/2016 Occupation: _0 @    Subjective:  Follow-up (has been in hospital) and Results (states she has had a negative TB skin test ) Follow-up on psoriasis and psoriatic arthritis.  History of Present Illness: Ashley Caldwell is a 76 y.o. female   patient recently was in a nursing home secondary to humeral fracture. She's been there for about 2 weeks. Patient is accompanied by her daughter Ashley Caldwell.  Patient states that Dr. Kenton Kingfisher, her orthopedist, is treating her humeral fracture with a sling because putting on a cast is not a good option. Patient states that with increased swelling after the cast is on it could damage her tissue/muscles. She is doing well with her humeral fracture during the daytime but she has significant pain at night.  When she was in the nursing home, all of her medications were stopped Including methotrexate. (Note: Patient is been off of Enbrel since January 2017 due to insurance issues. In addition, she had to indeterminate TB gold test. As a result we had to do a PPD. The PPD was administered at the nursing home. We have called her nursing home to request the results and we have yet to hear back from them regarding this.)  Patient has fibromyalgia. She's having a fair amount of stress currently but her fibromyalgia is not flaring at this time. Her main pain is to her right chest area where there was an impact when she fell.  She reports that her psoriatic arthritis is not flaring. She does not have any joint pain stiffness or swelling despite being off of methotrexate.  Patient psoriasis is also not flaring too much. She does have few scattered lesions She can use over-the-counter hydrocortisone for relief.  Her main concern is a yeast infection she has under her  right breast. She was being treated with medication prescribed by Dr. Nevada Crane. Unfortunately that medication was confiscated by the nursing home. I've asked the patient to contact her PCP so that he can prescribe her this medication once again. She reports that it was nystatin powder. Ashley Caldwell, patient's daughter, will contact Dr. Nevada Crane and request this medication once again. This rash is very uncomfortable for the patient and she would benefit from getting that taken care of.   Activities of Daily Living:  Patient reports morning stiffness for 30 minutes.   Patient Reports nocturnal pain.  Difficulty dressing/grooming: Reports Difficulty climbing stairs: Reports Difficulty getting out of chair: Reports Difficulty using hands for taps, buttons, cutlery, and/or writing: Reports   No Rheumatology ROS completed.   PMFS History:  Patient Active Problem List   Diagnosis Date Noted  . History of renal failure 04/26/2016  . Primary osteoarthritis of both knees 04/26/2016  . Renal failure 04/13/2016  . Acute renal failure ([place]) 04/13/2016  . Psoriasis 01/18/2016  . Fibromyalgia 01/18/2016  . DDD (degenerative disc disease), lumbar 01/18/2016  . Osteoarthritis of both knees 01/18/2016  . Elevated cholesterol 01/18/2016  . Hypoglycemia associated with diabetes ([place]) 01/16/2011  . Compression fracture of vertebral column ([place]) 01/16/2011  . Hyponatremia 01/14/2011  . Pneumonia 01/13/2011  . Hyperglycemia 01/13/2011  . Weakness generalized 01/13/2011  . DM type  2 (diabetes mellitus, type 2) ([place]) 01/13/2011  . HTN (hypertension), benign 01/13/2011  . Psoriatic arthritis ([place]) 01/13/2011  . Anemia 01/13/2011  . Thyroiditis, chronic 01/13/2011  . Morbid obesity ([place]) 01/13/2011  . Asthma 01/13/2011    Past Medical History:  Diagnosis Date  . Arthritis   . Asthma   . CFS (chronic fatigue syndrome)   . Compression fracture of vertebral column ([place]) 01/16/2011   per CXR  . DDD  (degenerative disc disease), lumbar 01/18/2016  . Diabetes mellitus   . Elevated cholesterol 01/18/2016  . Fibromyalgia 01/18/2016  . Fibromyositis   . Hashimoto's thyroiditis   . HTN (hypertension), benign   . Hypoglycemia associated with diabetes ([place]) 01/16/2011  . Morbid obesity ([place])   . Osteoarthritis of both knees 01/18/2016  . Pneumonia 12/2010  . Psoriasis 01/18/2016  . Psoriatic arthritis ([place])   . Thyroid disease     Family History  Problem Relation Age of Onset  . Diabetes Father   . Hypertension Mother   . Stroke Mother   . Cancer Brother   . Cancer Brother    Past Surgical History:  Procedure Laterality Date  . ABDOMINAL HYSTERECTOMY    . ABDOMINAL SURGERY     lapband  . CATARACT EXTRACTION    . KNEE SURGERY     Social History   Social History Narrative  . No narrative on file     Objective: Vital Signs: BP 132/82   Pulse 82   Resp 16   Wt (!) 330 lb (149.7 kg) Comment: in wheelchair  BMI 51.69 kg/m    Physical Exam   Musculoskeletal Exam:  Good range of motion of all joints except she is unable to use her right upper extremity secondary to humeral fracture. She is actually in a sling at this time. Grip strength is equal and strong bilaterally Fibromyalgia tender points are 6 out of 18 positive. (Bilateral trapezius muscles bilateral SI joint and bilateral greater trochanter bursa)  CDAI Exam: No CDAI exam completed.  No synovitis on examination  Investigation: Findings:  06/26/2013 X-rays of the bilateral knees done today.  These show severe bilateral medial compartment narrowing.  Patellofemoral joint space narrowing.  No CPPD.  This is consistent with severe OA.    12/14/2015  And 12/26/2015 indeterminate TB gold   Note: PPD was administered at the nursing home. Were waiting the nursing home ([facility] [place], [address] telephone [phone]; fax [phone]) to fax [place] the results. According to the patient a PPD was administered  and was already read and is far as the patient knows it's probably negative.  Admission on 04/13/2016, Discharged on 04/16/2016  Component Date Value Ref Range Status  . WBC 04/13/2016 13.2* 4.0 - 10.5 K/uL Final  . RBC 04/13/2016 4.01  3.87 - 5.11 MIL/uL Final  . Hemoglobin 04/13/2016 12.9  12.0 - 15.0 g/dL Final  . HCT 04/13/2016 38.0  36.0 - 46.0 % Final  . MCV 04/13/2016 94.8  78.0 - 100.0 fL Final  . MCH 04/13/2016 32.2  26.0 - 34.0 pg Final  . MCHC 04/13/2016 33.9  30.0 - 36.0 g/dL Final  . RDW 04/13/2016 14.1  11.5 - 15.5 % Final  . Platelets 04/13/2016 244  150 - 400 K/uL Final  . Neutrophils Relative % 04/13/2016 78  % Final  . Neutro Abs 04/13/2016 10.4* 1.7 - 7.7 K/uL Final  . Lymphocytes Relative 04/13/2016 9  % Final  . Lymphs Abs 04/13/2016 1.1  0.7 -  4.0 K/uL Final  . Monocytes Relative 04/13/2016 12  % Final  . Monocytes Absolute 04/13/2016 1.5* 0.1 - 1.0 K/uL Final  . Eosinophils Relative 04/13/2016 1  % Final  . Eosinophils Absolute 04/13/2016 0.1  0.0 - 0.7 K/uL Final  . Basophils Relative 04/13/2016 0  % Final  . Basophils Absolute 04/13/2016 0.0  0.0 - 0.1 K/uL Final  . Sodium 04/13/2016 127* 135 - 145 mmol/L Final  . Potassium 04/13/2016 4.6  3.5 - 5.1 mmol/L Final  . Chloride 04/13/2016 90* 101 - 111 mmol/L Final  . CO2 04/13/2016 26  22 - 32 mmol/L Final  . Glucose, Bld 04/13/2016 222* 65 - 99 mg/dL Final  . BUN 04/13/2016 43* 6 - 20 mg/dL Final  . Creatinine, Ser 04/13/2016 3.42* 0.44 - 1.00 mg/dL Final  . Calcium 04/13/2016 9.4  8.9 - 10.3 mg/dL Final  . GFR calc non Af Amer 04/13/2016 12* >60 mL/min Final  . GFR calc Af Amer 04/13/2016 14* >60 mL/min Final   Comment: (NOTE) The eGFR has been calculated using the CKD EPI equation. This calculation has not been validated in all clinical situations. eGFR's persistently <60 mL/min signify possible Chronic Kidney Disease.   . Anion gap 04/13/2016 11  5 - 15 Final  . Color, Urine 04/14/2016 AMBER* YELLOW  Final  . APPearance 04/14/2016 CLEAR  CLEAR Final  . Specific Gravity, Urine 04/14/2016 >1.030* 1.005 - 1.030 Final  . pH 04/14/2016 5.0  5.0 - 8.0 Final  . Glucose, UA 04/14/2016 NEGATIVE  NEGATIVE mg/dL Final  . Hgb urine dipstick 04/14/2016 NEGATIVE  NEGATIVE Final  . Bilirubin Urine 04/14/2016 MODERATE* NEGATIVE Final  . Ketones, ur 04/14/2016 TRACE* NEGATIVE mg/dL Final  . Protein, ur 04/14/2016 NEGATIVE  NEGATIVE mg/dL Final  . Nitrite 04/14/2016 NEGATIVE  NEGATIVE Final  . Leukocytes, UA 04/14/2016 NEGATIVE  NEGATIVE Final  . Osmolality 04/13/2016 285  275 - 295 mOsm/kg Final  . Osmolality, Ur 04/14/2016 328  300 - 900 mOsm/kg Final  . Sodium, Ur 04/14/2016 <10  mmol/L Final  . Creatinine, Urine 04/14/2016 303.31  mg/dL Final  . Specimen Description 04/14/2016 URINE, CATHETERIZED   Final  . Special Requests 04/14/2016 NONE   Final  . Culture 04/14/2016    Final                   Value:NO GROWTH Performed at [facility], [place] [address] [place], [place] [address]   . Report Status 04/14/2016 04/15/2016 FINAL   Final  . TSH 04/13/2016 1.176  0.350 - 4.500 uIU/mL Final  . Hgb A1c MFr Bld 04/13/2016 8.5* 4.8 - 5.6 % Final   Comment: (NOTE)         Pre-diabetes: 5.7 - 6.4         Diabetes: >6.4         Glycemic control for adults with diabetes: <7.0   . Mean Plasma Glucose 04/13/2016 197  mg/dL Final   Comment: (NOTE) Performed At: [facility] [place], [place] [id number] Lindon Romp MD NO:[id number]   . Glucose-Capillary 04/13/2016 221* 65 - 99 mg/dL Final  . Glucose-Capillary 04/13/2016 166* 65 - 99 mg/dL Final  . Comment 1 04/13/2016 Notify RN   Final  . Comment 2 04/13/2016 Document in Chart   Final  . Glucose-Capillary 04/14/2016 71  65 - 99 mg/dL Final  . Comment 1 04/14/2016 Notify RN   Final  . Comment 2 04/14/2016 Document in Chart  Final  . WBC 04/14/2016 8.6  4.0 - 10.5 K/uL Final  . RBC 04/14/2016 3.96  3.87 - 5.11 MIL/uL  Final  . Hemoglobin 04/14/2016 12.6  12.0 - 15.0 g/dL Final  . HCT 04/14/2016 38.1  36.0 - 46.0 % Final  . MCV 04/14/2016 96.2  78.0 - 100.0 fL Final  . MCH 04/14/2016 31.8  26.0 - 34.0 pg Final  . MCHC 04/14/2016 33.1  30.0 - 36.0 g/dL Final  . RDW 04/14/2016 14.2  11.5 - 15.5 % Final  . Platelets 04/14/2016 243  150 - 400 K/uL Final  . Sodium 04/14/2016 130* 135 - 145 mmol/L Final  . Potassium 04/14/2016 4.0  3.5 - 5.1 mmol/L Final  . Chloride 04/14/2016 95* 101 - 111 mmol/L Final  . CO2 04/14/2016 24  22 - 32 mmol/L Final  . Glucose, Bld 04/14/2016 65  65 - 99 mg/dL Final  . BUN 04/14/2016 50* 6 - 20 mg/dL Final  . Creatinine, Ser 04/14/2016 2.69* 0.44 - 1.00 mg/dL Final  . Calcium 04/14/2016 8.7* 8.9 - 10.3 mg/dL Final  . GFR calc non Af Amer 04/14/2016 16* >60 mL/min Final  . GFR calc Af Amer 04/14/2016 19* >60 mL/min Final   Comment: (NOTE) The eGFR has been calculated using the CKD EPI equation. This calculation has not been validated in all clinical situations. eGFR's persistently <60 mL/min signify possible Chronic Kidney Disease.   . Anion gap 04/14/2016 11  5 - 15 Final  . Glucose-Capillary 04/14/2016 82  65 - 99 mg/dL Final  . Comment 1 04/14/2016 Notify RN   Final  . Comment 2 04/14/2016 Document in Chart   Final  . Glucose-Capillary 04/14/2016 89  65 - 99 mg/dL Final  . Comment 1 04/14/2016 Notify RN   Final  . Comment 2 04/14/2016 Document in Chart   Final  . Sodium 04/15/2016 134* 135 - 145 mmol/L Final  . Potassium 04/15/2016 4.5  3.5 - 5.1 mmol/L Final  . Chloride 04/15/2016 103  101 - 111 mmol/L Final  . CO2 04/15/2016 26  22 - 32 mmol/L Final  . Glucose, Bld 04/15/2016 92  65 - 99 mg/dL Final  . BUN 04/15/2016 43* 6 - 20 mg/dL Final  . Creatinine, Ser 04/15/2016 1.26* 0.44 - 1.00 mg/dL Final  . Calcium 04/15/2016 8.7* 8.9 - 10.3 mg/dL Final  . GFR calc non Af Amer 04/15/2016 41* >60 mL/min Final  . GFR calc Af Amer 04/15/2016 48* >60 mL/min Final    Comment: (NOTE) The eGFR has been calculated using the CKD EPI equation. This calculation has not been validated in all clinical situations. eGFR's persistently <60 mL/min signify possible Chronic Kidney Disease.   . Anion gap 04/15/2016 5  5 - 15 Final  . Glucose-Capillary 04/14/2016 77  65 - 99 mg/dL Final  . Comment 1 04/14/2016 Notify RN   Final  . Comment 2 04/14/2016 Document in Chart   Final  . Glucose-Capillary 04/15/2016 78  65 - 99 mg/dL Final  . Comment 1 04/15/2016 Notify RN   Final  . Comment 2 04/15/2016 Document in Chart   Final  . Glucose-Capillary 04/15/2016 149* 65 - 99 mg/dL Final  . Comment 1 04/15/2016 Notify RN   Final  . Comment 2 04/15/2016 Document in Chart   Final  . Glucose-Capillary 04/15/2016 201* 65 - 99 mg/dL Final  . Comment 1 04/15/2016 Notify RN   Final  . Comment 2 04/15/2016 Document in Chart   Final  .  Glucose-Capillary 04/15/2016 295* 65 - 99 mg/dL Final  . Comment 1 04/15/2016 Notify RN   Final  . Comment 2 04/15/2016 Document in Chart   Final  . Glucose-Capillary 04/16/2016 248* 65 - 99 mg/dL Final  . Glucose-Capillary 04/16/2016 238* 65 - 99 mg/dL Final     Imaging: Dg Ribs Unilateral W/chest Left  Result Date: 04/12/2016 CLINICAL DATA:  RIGHT lower leg pain, LEFT rib pain at breast area, bruising at LEFT ribs, slipped and fell 2 days ago, known RIGHT humeral fracture post fall, diabetes mellitus, hypertension calf EXAM: LEFT RIBS AND CHEST - 3+ VIEW COMPARISON:  Chest radiograph 01/13/2011 FINDINGS: Enlargement of cardiac silhouette. Tortuous aorta. Mediastinal contours and hilar vascularity normal. Chronic bronchitic changes without pulmonary infiltrate, pleural effusion or pneumothorax. Slight chronic accentuation of LEFT basilar markings appears unchanged. Diffuse osseous demineralization. Rib assessment is limited by the degree of osseous demineralization. No definite rib fractures of bone destruction. Prior laparoscopic gastric banding.  IMPRESSION: Enlargement of cardiac silhouette. Minimal chronic lung changes without acute infiltrate. Osseous demineralization without definite LEFT rib fracture. Electronically Signed   By: Lavonia Dana M.D.   On: 04/12/2016 14:04   Dg Shoulder Right  Result Date: 04/24/2016 CLINICAL DATA:  Proximal humeral pain following a fall on January 24th. Limited range of motion. EXAM: RIGHT SHOULDER - 2+ VIEW COMPARISON:  Right shoulder series of April 11, 2016. FINDINGS: The subcapital fracture of the right humerus is again demonstrated. It has not dramatically changed in appearance. The humeral head remains related to the bony glenoid. There is displacement of the proximal humeral shaft with respect to the humeral head which appears stable. The bony glenoid as well as the acromion AC joint and right clavicle appear normal. IMPRESSION: Stable appearance of the displaced subcapital fracture of the right humerus. Electronically Signed   By: David  Martinique M.D.   On: 04/24/2016 08:03   Dg Shoulder Right  Result Date: 04/11/2016 CLINICAL DATA:  Right shoulder pain.  Fall. EXAM: RIGHT SHOULDER - 2+ VIEW COMPARISON:  04/11/2016. FINDINGS: Angulated sub calf fracture of the proximal right humerus is present. No definite shoulder dislocation. Severe diffuse osteopenia. IMPRESSION: Angulated subcapital fracture of the of proximal right humerus. Electronically Signed   By: Marcello Moores  Register   On: 04/11/2016 08:23   Dg Tibia/fibula Right  Result Date: 04/12/2016 CLINICAL DATA:  Right lower leg pain EXAM: RIGHT TIBIA AND FIBULA - 2 VIEW COMPARISON:  None. FINDINGS: No fracture. No worrisome lytic or sclerotic bony abnormality. Advanced degenerative changes are noted in the knee. IMPRESSION: No acute findings Electronically Signed   By: Misty Stanley M.D.   On: 04/12/2016 14:04   US Renal  Result Date: 04/13/2016 CLINICAL DATA:  Acute renal failure EXAM: RENAL / URINARY TRACT ULTRASOUND COMPLETE COMPARISON:  None.  FINDINGS: Right Kidney: Length: 10.1 cm. Echogenicity within normal limits. No mass or hydronephrosis visualized. Left Kidney: Length: 9.3 cm, difficult to visualize due to body habitus and overlying bowel gas. Echogenicity within normal limits. No mass or hydronephrosis visualized. Bladder: Not distended, grossly unremarkable. IMPRESSION: No acute findings.  No hydronephrosis. Electronically Signed   By: Rolm Baptise M.D.   On: 04/13/2016 13:55   Dg Chest Port 1 View  Result Date: 04/13/2016 CLINICAL DATA:  Shortness of Breath EXAM: PORTABLE CHEST 1 VIEW COMPARISON:  04/12/2016 FINDINGS: Cardiomegaly. Mild vascular congestion. Linear scarring in the lingula. Otherwise no confluent opacities. No edema or effusions. No acute bony abnormality. IMPRESSION: Cardiomegaly, vascular congestion. Electronically Signed  By: Rolm Baptise M.D.   On: 04/13/2016 13:42   Dg Humerus Right  Result Date: 04/11/2016 CLINICAL DATA:  Fall. EXAM: RIGHT HUMERUS - 2+ VIEW COMPARISON:  Chest x-ray of 01/13/2011. FINDINGS: Angulated subcapital fracture of the prior right humerus noted. No other definite focal abnormalities identified. Diffuse severe osteopenia . IMPRESSION: Angulated subcapital fracture of the prior right humerus. Electronically Signed   By: Marcello Moores  Register   On: 04/11/2016 08:24    Speciality Comments: No specialty comments available.    Procedures:  No procedures performed Allergies: Amoxicillin-pot clavulanate; Lyrica [pregabalin]; and Latex   Assessment / Plan:     Visit Diagnoses: Psoriatic arthritis ([place])  Psoriasis  History of renal failure  Fibromyalgia  High risk medication use - Mehtotrexate and Enbrel   History of asthma  DDD (degenerative disc disease), lumbar  History of diabetes mellitus  History of hyperlipidemia  Primary osteoarthritis of both knees    Plan: #1: Patient presents today for follow-up on psoriasis and psoriatic arthritis. Unfortunately about 2 weeks  ago patient fell. She has sustained is supple No fracture of the right humerus. An incidental finding also includes cardiomegaly and vascular congestion.  She was taken off of methotrexate when she was admitted to the nursing home. She has not had any flares despite being off of methotrexate. She has minor psoriasis flares to her body. (Note: Has been off of Enbrel since 2017. Initially her insurance changed and she could not get Enbrel. Subsequently, she has had minimal flare.  #2: Fibromyalgia. Ongoing discomfort. Currently patient under a lot of stress secondary to humeral fracture and having 18 out of 18 tender points.  #3: High risk prescription. We'll stay off of methotrexate for now since patient does not have any synovitis or any joint pain. Her psoriasis is not flaring so she can use over-the-counter hydrocortisone cream when necessary  #3: Patient has yeast infection under her right breast area. Her PCP has prescribed her nystatin powder in the past. When she went to the nursing home, they confiscated all of her medication. I've asked her to have her PCP ordered the test for her.  #5: Patient had frequent falls lately. I'll do a physical therapy order to help her with ambulation and for prevention. She is already doing physical therapy at the nursing home for her right humeral fracture.  #6: Return to clinic in 2 months  #7: Patient is on diuretics. She states that until this week she was unable to use her diuretics and as a result she is having lower extremity pitting edema. She is also finding it very painful. I encouraged patient to elevate her legs as well as coordinate care with her PCP or the doctor responsible for the nursing home and address this area and coordination. Reviewing her x-rays shows that she has cardiomegaly and vascular congestion.  #8: After she restarts her methotrexate in the future, we will order lab work 2 months after starting. She is  ordered he had lab work recently and it is within normal limits  Orders: No orders of the defined types were placed in this encounter.  No orders of the defined types were placed in this encounter.   Face-to-face time spent with patient was 40 minutes. 50% of time was spent in counseling and coordination of care.  Follow-Up Instructions: Return in about 2 months (around 06/24/2016) for PsA,Ps,Rt Humeral Fx, Nursing home & Rehab,off MTX; no Enbrel since 2017 when insurrance changed.   Eliezer Lofts, PA-C  Note - This record has been created using [employer].  Chart creation errors have been sought, but may not always  have been located. Such creation errors do not reflect on  the standard of medical care.

## 2016-04-26 NOTE — Telephone Encounter (Signed)
No therapy on fractured arm for 2 weeks

## 2016-04-26 NOTE — Telephone Encounter (Signed)
Returned call, no answer, left vm 

## 2016-04-27 ENCOUNTER — Encounter: Payer: Self-pay | Admitting: Orthopedic Surgery

## 2016-04-27 ENCOUNTER — Ambulatory Visit (INDEPENDENT_AMBULATORY_CARE_PROVIDER_SITE_OTHER): Payer: Medicare HMO | Admitting: Orthopedic Surgery

## 2016-04-27 DIAGNOSIS — G894 Chronic pain syndrome: Secondary | ICD-10-CM

## 2016-04-27 DIAGNOSIS — S42292P Other displaced fracture of upper end of left humerus, subsequent encounter for fracture with malunion: Secondary | ICD-10-CM | POA: Diagnosis not present

## 2016-04-27 MED ORDER — BUPRENORPHINE 10 MCG/HR TD PTWK: 10.0000 ug | MEDICATED_PATCH | TRANSDERMAL | 0 refills | Status: DC

## 2016-04-27 NOTE — Progress Notes (Signed)
FOLLOW UP VISIT   Patient ID: Ashley Caldwell, female   DOB: October 25, 1940, 76 y.o.   MRN: [id number]  Chief Complaint  Patient presents with  . Follow-up    LEFT PROXIMAL HUMERUS FRACTURE, DOI 04/11/16    HPI Ashley Caldwell is a 76 y.o. female.   HPI  76 year old female with a proximal humerus fracture when she came in to see [place] last time she was in a really bad condition had to be admitted to the hospital for dehydration she presents back for a 2 week follow-up x-ray from an injury on January 24 X-ray shows fracture displacement on the lateral view   Review of Systems Review of Systems  The patient has recovered from the dehydration she is taking fluids well eating normally   Physical Exam  MEDICAL DECISION MAKING  DATA   X-ray taken February 5 shows displacement of the fracture on both AP and lateral x-ray, he screamed not on the cone  DIAGNOSIS  Encounter Diagnoses  Name Primary?  . Chronic pain syndrome Yes  . Other closed displaced fracture of proximal end of left humerus with malunion, subsequent encounter     PLAN(RISK)    #1 the patient has been off of her [place] transdermal patch so we reordered that I gave her prescription she can continue with oxycodone IR 5 mg which she takes without Tylenol No. 2 referral for ORIF versus proximal humeral replacement   Follow up referral

## 2016-04-27 NOTE — Telephone Encounter (Signed)
Returned call, no answer.

## 2016-04-27 NOTE — Addendum Note (Signed)
Addended by: Adella Hare B on: 04/27/2016 01:02 PM   Modules accepted: Orders

## 2016-04-27 NOTE — Telephone Encounter (Signed)
Left detailed vm °

## 2016-04-27 NOTE — Patient Instructions (Signed)
Referral

## 2016-04-30 DIAGNOSIS — J9811 Atelectasis: Secondary | ICD-10-CM | POA: Diagnosis not present

## 2016-04-30 DIAGNOSIS — M797 Fibromyalgia: Secondary | ICD-10-CM | POA: Diagnosis not present

## 2016-04-30 DIAGNOSIS — M545 Low back pain: Secondary | ICD-10-CM | POA: Diagnosis not present

## 2016-04-30 DIAGNOSIS — S42201A Unspecified fracture of upper end of right humerus, initial encounter for closed fracture: Secondary | ICD-10-CM | POA: Diagnosis not present

## 2016-05-01 ENCOUNTER — Encounter (INDEPENDENT_AMBULATORY_CARE_PROVIDER_SITE_OTHER): Payer: Self-pay | Admitting: Orthopedic Surgery

## 2016-05-01 ENCOUNTER — Ambulatory Visit (INDEPENDENT_AMBULATORY_CARE_PROVIDER_SITE_OTHER): Payer: Medicare HMO | Admitting: Orthopedic Surgery

## 2016-05-01 ENCOUNTER — Ambulatory Visit
Admission: RE | Admit: 2016-05-01 | Discharge: 2016-05-01 | Disposition: A | Discharge status: Home / Self Care | Payer: Medicare HMO | Source: Ambulatory Visit | Attending: Orthopedic Surgery | Admitting: Orthopedic Surgery

## 2016-05-01 VITALS — Wt 330.0 lb

## 2016-05-01 DIAGNOSIS — S42292A Other displaced fracture of upper end of left humerus, initial encounter for closed fracture: Secondary | ICD-10-CM

## 2016-05-01 DIAGNOSIS — S42211A Unspecified displaced fracture of surgical neck of right humerus, initial encounter for closed fracture: Secondary | ICD-10-CM | POA: Diagnosis not present

## 2016-05-01 DIAGNOSIS — S42201A Unspecified fracture of upper end of right humerus, initial encounter for closed fracture: Secondary | ICD-10-CM | POA: Insufficient documentation

## 2016-05-01 NOTE — Progress Notes (Signed)
Office Visit Note   Patient: Ashley Caldwell           Date of Birth: [birth year]           MRN: [id number] Visit Date: 05/01/2016 Requested by: Benita Stabile, MD [address] [place], [place] [address] PCP: [name], MD  Subjective: Chief Complaint  Patient presents with  . Right Upper Arm - Fracture    HPI patient is a 76 year old patient with right shoulder pain.  She sustained a right proximal humerus fracture 04/11/2016 and is referred here for further evaluation and management.  She's been in a sling.  She is left-hand dominant.  She has insulin-dependent diabetes as well as psoriatic arthritis.  She is currently off methotrexate and Enbrel.  She is taking oxycodone with relief.  She has a daughter but the daughter states that she cannot take care of her in her current condition.  She is staying in a nursing home at this time.              Review of Systems All systems reviewed are negative as they relate to the chief complaint within the history of present illness.  Patient denies  fevers or chills.    Assessment & Plan: Visit Diagnoses:  1. Other closed displaced fracture of proximal end of left humerus, initial encounter     Plan: Impression is right displaced proximal humerus fracture in a morbidly obese female with multiple other complicating medical comorbidities.  She is at high risk for complication with any surgery.  This may or may not be a fracture that can be managed closed.  Plan is CT scan to evaluate fracture displacement as well as fracture comminution of this proximal portion.  I did discuss with her that should she require surgery that if it were to become infected it would require hardware removal antibiotic bead placement and she would potentially end up with the same result as if she does heal on its own in a nonunited position.  I'll see her back after the CT scan and we can plan further operative and nonoperative treatment at that time.  Follow-Up  Instructions: No Follow-up on file.   Orders:  No orders of the defined types were placed in this encounter.  No orders of the defined types were placed in this encounter.     Procedures: No procedures performed   Clinical Data: No additional findings.  Objective: Vital Signs: Wt (!) 330 lb (149.7 kg)   BMI 51.69 kg/m   Physical Exam   Constitutional: Patient appears well-developed HEENT:  Head: Normocephalic Eyes:EOM are normal Neck: Normal range of motion Cardiovascular: Normal rate Pulmonary/chest: Effort normal Neurologic: Patient is alert Skin: Skin is warm Psychiatric: Patient has normal mood and affect    Ortho Exam orthopedic exam demonstrates increased body mass index motor sensory function to the hand is intact.  Some swelling is present in the right arm.  Elbow range of motion nontender.  Radial pulses intact.  She does have some psoriatic plaques on her lower extremities.  These have come up since she has been off the methotrexate and Enbrel  Specialty Comments:  No specialty comments available.  Imaging: No results found.   PMFS History: Patient Active Problem List   Diagnosis Date Noted  . Closed fracture of left proximal humerus 05/01/2016  . History of renal failure 04/26/2016  . Primary osteoarthritis of both knees 04/26/2016  . Renal failure 04/13/2016  . Acute renal failure (HCC)  04/13/2016  . Psoriasis 01/18/2016  . Fibromyalgia 01/18/2016  . DDD (degenerative disc disease), lumbar 01/18/2016  . Osteoarthritis of both knees 01/18/2016  . Elevated cholesterol 01/18/2016  . Hypoglycemia associated with diabetes (HCC) 01/16/2011  . Compression fracture of vertebral column (HCC) 01/16/2011  . Hyponatremia 01/14/2011  . Pneumonia 01/13/2011  . Hyperglycemia 01/13/2011  . Weakness generalized 01/13/2011  . DM type 2 (diabetes mellitus, type 2) (HCC) 01/13/2011  . HTN (hypertension), benign 01/13/2011  . Psoriatic arthritis (HCC)  01/13/2011  . Anemia 01/13/2011  . Thyroiditis, chronic 01/13/2011  . Morbid obesity (HCC) 01/13/2011  . Asthma 01/13/2011   Past Medical History:  Diagnosis Date  . Arthritis   . Asthma   . CFS (chronic fatigue syndrome)   . Compression fracture of vertebral column (HCC) 01/16/2011   per CXR  . DDD (degenerative disc disease), lumbar 01/18/2016  . Diabetes mellitus   . Elevated cholesterol 01/18/2016  . Fibromyalgia 01/18/2016  . Fibromyositis   . Hashimoto's thyroiditis   . HTN (hypertension), benign   . Hypoglycemia associated with diabetes (HCC) 01/16/2011  . Morbid obesity (HCC)   . Osteoarthritis of both knees 01/18/2016  . Pneumonia 12/2010  . Psoriasis 01/18/2016  . Psoriatic arthritis (HCC)   . Thyroid disease     Family History  Problem Relation Age of Onset  . Diabetes Father   . Hypertension Mother   . Stroke Mother   . Cancer Brother   . Cancer Brother     Past Surgical History:  Procedure Laterality Date  . ABDOMINAL HYSTERECTOMY    . ABDOMINAL SURGERY     lapband  . CATARACT EXTRACTION    . KNEE SURGERY     Social History   Occupational History  . Not on file.   Social History Main Topics  . Smoking status: Former Smoker    Years: 20.00    Types: Cigarettes    Quit date: 01/19/1991  . Smokeless tobacco: Never Used  . Alcohol use No  . Drug use: No  . Sexual activity: Not on file

## 2016-05-02 ENCOUNTER — Encounter (INDEPENDENT_AMBULATORY_CARE_PROVIDER_SITE_OTHER): Payer: Self-pay | Admitting: Orthopedic Surgery

## 2016-05-02 ENCOUNTER — Ambulatory Visit (INDEPENDENT_AMBULATORY_CARE_PROVIDER_SITE_OTHER): Payer: Medicare HMO | Admitting: Orthopedic Surgery

## 2016-05-02 DIAGNOSIS — S42292A Other displaced fracture of upper end of left humerus, initial encounter for closed fracture: Secondary | ICD-10-CM

## 2016-05-02 NOTE — Progress Notes (Signed)
Office Visit Note   Patient: Ashley Caldwell           Date of Birth: Dec 03, 1940           MRN: [id number] Visit Date: 05/02/2016 Requested by: Benita Stabile, MD [address] [place], [place] [address] PCP: [name], MD  Subjective: Chief Complaint  Patient presents with  . Right Shoulder - Follow-up, Fracture    HPI              Review of Systems   Assessment & Plan: Visit Diagnoses:  1. Other closed displaced fracture of proximal end of left humerus, initial encounter     Plan: See above  Follow-Up Instructions: No Follow-up on file.   Orders:  No orders of the defined types were placed in this encounter.  No orders of the defined types were placed in this encounter.     Procedures: No procedures performed   Clinical Data: No additional findings.  Objective: Vital Signs: There were no vitals taken for this visit.  Physical Exam  Ortho Exam  Specialty Comments:  No specialty comments available.  Imaging: No results found.   PMFS History: Patient Active Problem List   Diagnosis Date Noted  . Closed fracture of left proximal humerus 05/01/2016  . History of renal failure 04/26/2016  . Primary osteoarthritis of both knees 04/26/2016  . Renal failure 04/13/2016  . Acute renal failure (HCC) 04/13/2016  . Psoriasis 01/18/2016  . Fibromyalgia 01/18/2016  . DDD (degenerative disc disease), lumbar 01/18/2016  . Osteoarthritis of both knees 01/18/2016  . Elevated cholesterol 01/18/2016  . Hypoglycemia associated with diabetes (HCC) 01/16/2011  . Compression fracture of vertebral column (HCC) 01/16/2011  . Hyponatremia 01/14/2011  . Pneumonia 01/13/2011  . Hyperglycemia 01/13/2011  . Weakness generalized 01/13/2011  . DM type 2 (diabetes mellitus, type 2) (HCC) 01/13/2011  . HTN (hypertension), benign 01/13/2011  . Psoriatic arthritis (HCC) 01/13/2011  . Anemia 01/13/2011  . Thyroiditis, chronic 01/13/2011  . Morbid obesity (HCC) 01/13/2011    . Asthma 01/13/2011   Past Medical History:  Diagnosis Date  . Arthritis   . Asthma   . CFS (chronic fatigue syndrome)   . Compression fracture of vertebral column (HCC) 01/16/2011   per CXR  . DDD (degenerative disc disease), lumbar 01/18/2016  . Diabetes mellitus   . Elevated cholesterol 01/18/2016  . Fibromyalgia 01/18/2016  . Fibromyositis   . Hashimoto's thyroiditis   . HTN (hypertension), benign   . Hypoglycemia associated with diabetes (HCC) 01/16/2011  . Morbid obesity (HCC)   . Osteoarthritis of both knees 01/18/2016  . Pneumonia 12/2010  . Psoriasis 01/18/2016  . Psoriatic arthritis (HCC)   . Thyroid disease     Family History  Problem Relation Age of Onset  . Diabetes Father   . Hypertension Mother   . Stroke Mother   . Cancer Brother   . Cancer Brother     Past Surgical History:  Procedure Laterality Date  . ABDOMINAL HYSTERECTOMY    . ABDOMINAL SURGERY     lapband  . CATARACT EXTRACTION    . KNEE SURGERY     Social History   Occupational History  . Not on file.   Social History Main Topics  . Smoking status: Former Smoker    Years: 20.00    Types: Cigarettes    Quit date: 01/19/1991  . Smokeless tobacco: Never Used  . Alcohol use No  . Drug use: No  . Sexual  activity: Not on file

## 2016-05-02 NOTE — Progress Notes (Signed)
Post-Op Visit Note   Patient: Ashley Caldwell           Date of Birth: 12/30/1940           MRN: [id number] Visit Date: 05/02/2016 PCP: [name], MD   Assessment & Plan:  Chief Complaint:  Chief Complaint  Patient presents with  . Right Shoulder - Follow-up, Fracture   Visit Diagnoses:  1. Other closed displaced fracture of proximal end of left humerus, initial encounter     Plan: Ashley Caldwell is a 76 year old patient with right proximal humerus fracture.  She is morbidly obese and has diabetes and psoriatic arthritis.  Since I have seen her r she's had a CT scan which demonstrates complex proximal humerus fracture which if required surgery would require reverse shoulder replacement.  That procedure would have high risk of complication in this patient.  On exam the fracture does appear to be moving as a unit but her functional functional range of motion is not great as would be expected with this fracture at this point in time.  Plan at this time after lengthy discussion with ellen and her daughter is to continue with nonoperative care to see how functional the shoulder can become.  I would have her start pendulum exercises 2 times a day for the next 3 weeks 10 reps clockwise 10 reps counterclockwise with some elbow range of motion as well.  We'll DC this sling in 3 weeks and I'll see her back in 3 weeks for clinical recheck.  Follow-Up Instructions: No Follow-up on file.   Orders:  No orders of the defined types were placed in this encounter.  No orders of the defined types were placed in this encounter.   Imaging: No results found.  PMFS History: Patient Active Problem List   Diagnosis Date Noted  . Closed fracture of left proximal humerus 05/01/2016  . History of renal failure 04/26/2016  . Primary osteoarthritis of both knees 04/26/2016  . Renal failure 04/13/2016  . Acute renal failure (HCC) 04/13/2016  . Psoriasis 01/18/2016  . Fibromyalgia 01/18/2016  . DDD  (degenerative disc disease), lumbar 01/18/2016  . Osteoarthritis of both knees 01/18/2016  . Elevated cholesterol 01/18/2016  . Hypoglycemia associated with diabetes (HCC) 01/16/2011  . Compression fracture of vertebral column (HCC) 01/16/2011  . Hyponatremia 01/14/2011  . Pneumonia 01/13/2011  . Hyperglycemia 01/13/2011  . Weakness generalized 01/13/2011  . DM type 2 (diabetes mellitus, type 2) (HCC) 01/13/2011  . HTN (hypertension), benign 01/13/2011  . Psoriatic arthritis (HCC) 01/13/2011  . Anemia 01/13/2011  . Thyroiditis, chronic 01/13/2011  . Morbid obesity (HCC) 01/13/2011  . Asthma 01/13/2011   Past Medical History:  Diagnosis Date  . Arthritis   . Asthma   . CFS (chronic fatigue syndrome)   . Compression fracture of vertebral column (HCC) 01/16/2011   per CXR  . DDD (degenerative disc disease), lumbar 01/18/2016  . Diabetes mellitus   . Elevated cholesterol 01/18/2016  . Fibromyalgia 01/18/2016  . Fibromyositis   . Hashimoto's thyroiditis   . HTN (hypertension), benign   . Hypoglycemia associated with diabetes (HCC) 01/16/2011  . Morbid obesity (HCC)   . Osteoarthritis of both knees 01/18/2016  . Pneumonia 12/2010  . Psoriasis 01/18/2016  . Psoriatic arthritis (HCC)   . Thyroid disease     Family History  Problem Relation Age of Onset  . Diabetes Father   . Hypertension Mother   . Stroke Mother   . Cancer Brother   . Cancer  Brother     Past Surgical History:  Procedure Laterality Date  . ABDOMINAL HYSTERECTOMY    . ABDOMINAL SURGERY     lapband  . CATARACT EXTRACTION    . KNEE SURGERY     Social History   Occupational History  . Not on file.   Social History Main Topics  . Smoking status: Former Smoker    Years: 20.00    Types: Cigarettes    Quit date: 01/19/1991  . Smokeless tobacco: Never Used  . Alcohol use No  . Drug use: No  . Sexual activity: Not on file

## 2016-05-23 ENCOUNTER — Ambulatory Visit (INDEPENDENT_AMBULATORY_CARE_PROVIDER_SITE_OTHER): Payer: Medicare HMO | Admitting: Orthopedic Surgery

## 2016-05-23 ENCOUNTER — Ambulatory Visit (INDEPENDENT_AMBULATORY_CARE_PROVIDER_SITE_OTHER): Payer: Medicare HMO

## 2016-05-23 ENCOUNTER — Encounter (INDEPENDENT_AMBULATORY_CARE_PROVIDER_SITE_OTHER): Payer: Self-pay | Admitting: Orthopedic Surgery

## 2016-05-23 DIAGNOSIS — M545 Low back pain, unspecified: Secondary | ICD-10-CM

## 2016-05-23 DIAGNOSIS — S42292A Other displaced fracture of upper end of left humerus, initial encounter for closed fracture: Secondary | ICD-10-CM

## 2016-05-23 NOTE — Progress Notes (Signed)
Office Visit Note   Patient: Ashley Caldwell           Date of Birth: 1940/12/08           MRN: [id number] Visit Date: 05/23/2016 Requested by: Benita Stabile, MD [address] [place], [place] [address] PCP: [name], MD  Subjective: Chief Complaint  Patient presents with  . Right Shoulder - Follow-up, Fracture  low back pain  HPI: Ashley Caldwell is a patient with low back pain since a fall where she injured her right shoulder.  She reports pain in the square of her back.  Denies any leg symptoms.  She's never really had any back pain before.  She states it is gradually getting better.  She is on a lot of pain medicine for other medical problems.              ROS: All systems reviewed are negative as they relate to the chief complaint within the history of present illness.  Patient denies  fevers or chills.   Assessment & Plan: Visit Diagnoses:  1. Acute bilateral low back pain without sciatica   2. Other closed displaced fracture of proximal end of left humerus, initial encounter     Plan: impression is L3 compression fracture no retropulsion no evidence of neurologic issues.  Her back pain is improving.  This is something we need to follow-up on an 6 weeks with repeat radiographs.  Follow-Up Instructions: No Follow-up on file.   Orders:  Orders Placed This Encounter  Procedures  . XR Lumbar Spine 2-3 Views   No orders of the defined types were placed in this encounter.     Procedures: No procedures performed   Clinical Data: No additional findings.  Objective: Vital Signs: There were no vitals taken for this visit.  Physical Exam:   Constitutional: Patient appears well-developed HEENT:  Head: Normocephalic Eyes:EOM are normal Neck: Normal range of motion Cardiovascular: Normal rate Pulmonary/chest: Effort normal Neurologic: Patient is alert Skin: Skin is warm Psychiatric: Patient has normal mood and affect    Ortho Exam: orthopedic exam demonstrates good  ankle dorsi and plantar flexion quite hamstring strength.  No groin pain with internal/external rotation of the legs.  She has limited walking endurance but that's probably more likely related to her deconditioning as opposed to her back fracture.  Specialty Comments:  No specialty comments available.  Imaging: Xr Lumbar Spine 2-3 Views  Result Date: 05/23/2016 Lumbar spine radiographs AP and lateral show compression fracture at L3.  The compression involves 50% loss of height with no retropulsion posteriorly.  Hip joints normal.  Remainder bony pelvis normal.    PMFS History: Patient Active Problem List   Diagnosis Date Noted  . Closed fracture of left proximal humerus 05/01/2016  . History of renal failure 04/26/2016  . Primary osteoarthritis of both knees 04/26/2016  . Renal failure 04/13/2016  . Acute renal failure (HCC) 04/13/2016  . Psoriasis 01/18/2016  . Fibromyalgia 01/18/2016  . DDD (degenerative disc disease), lumbar 01/18/2016  . Osteoarthritis of both knees 01/18/2016  . Elevated cholesterol 01/18/2016  . Hypoglycemia associated with diabetes (HCC) 01/16/2011  . Compression fracture of vertebral column (HCC) 01/16/2011  . Hyponatremia 01/14/2011  . Pneumonia 01/13/2011  . Hyperglycemia 01/13/2011  . Weakness generalized 01/13/2011  . DM type 2 (diabetes mellitus, type 2) (HCC) 01/13/2011  . HTN (hypertension), benign 01/13/2011  . Psoriatic arthritis (HCC) 01/13/2011  . Anemia 01/13/2011  . Thyroiditis, chronic 01/13/2011  . Morbid  obesity (HCC) 01/13/2011  . Asthma 01/13/2011   Past Medical History:  Diagnosis Date  . Arthritis   . Asthma   . CFS (chronic fatigue syndrome)   . Compression fracture of vertebral column (HCC) 01/16/2011   per CXR  . DDD (degenerative disc disease), lumbar 01/18/2016  . Diabetes mellitus   . Elevated cholesterol 01/18/2016  . Fibromyalgia 01/18/2016  . Fibromyositis   . Hashimoto's thyroiditis   . HTN (hypertension), benign     . Hypoglycemia associated with diabetes (HCC) 01/16/2011  . Morbid obesity (HCC)   . Osteoarthritis of both knees 01/18/2016  . Pneumonia 12/2010  . Psoriasis 01/18/2016  . Psoriatic arthritis (HCC)   . Thyroid disease     Family History  Problem Relation Age of Onset  . Diabetes Father   . Hypertension Mother   . Stroke Mother   . Cancer Brother   . Cancer Brother     Past Surgical History:  Procedure Laterality Date  . ABDOMINAL HYSTERECTOMY    . ABDOMINAL SURGERY     lapband  . CATARACT EXTRACTION    . KNEE SURGERY     Social History   Occupational History  . Not on file.   Social History Main Topics  . Smoking status: Former Smoker    Years: 20.00    Types: Cigarettes    Quit date: 01/19/1991  . Smokeless tobacco: Never Used  . Alcohol use No  . Drug use: No  . Sexual activity: Not on file

## 2016-05-23 NOTE — Progress Notes (Signed)
   Post-Op Visit Note   Patient: Ashley Caldwell           Date of Birth: January 02, 1941           MRN: [id number] Visit Date: 05/23/2016 PCP: [name], MD   Assessment & Plan:  Chief Complaint:  Chief Complaint  Patient presents with  . Right Shoulder - Follow-up, Fracture   Visit Diagnoses:  1. Other closed displaced fracture of proximal end of left humerus, initial encounter     Plan: patient is doing well from her right shoulder proximal humerus fracture.  She has predictably limited function but the fracture does appear to be moving as a unit.  Come back in 6 weeks for repeat clinical assessment  Follow-Up Instructions: No Follow-up on file.   Orders:  No orders of the defined types were placed in this encounter.  No orders of the defined types were placed in this encounter.   Imaging: No results found.  PMFS History: Patient Active Problem List   Diagnosis Date Noted  . Closed fracture of left proximal humerus 05/01/2016  . History of renal failure 04/26/2016  . Primary osteoarthritis of both knees 04/26/2016  . Renal failure 04/13/2016  . Acute renal failure (HCC) 04/13/2016  . Psoriasis 01/18/2016  . Fibromyalgia 01/18/2016  . DDD (degenerative disc disease), lumbar 01/18/2016  . Osteoarthritis of both knees 01/18/2016  . Elevated cholesterol 01/18/2016  . Hypoglycemia associated with diabetes (HCC) 01/16/2011  . Compression fracture of vertebral column (HCC) 01/16/2011  . Hyponatremia 01/14/2011  . Pneumonia 01/13/2011  . Hyperglycemia 01/13/2011  . Weakness generalized 01/13/2011  . DM type 2 (diabetes mellitus, type 2) (HCC) 01/13/2011  . HTN (hypertension), benign 01/13/2011  . Psoriatic arthritis (HCC) 01/13/2011  . Anemia 01/13/2011  . Thyroiditis, chronic 01/13/2011  . Morbid obesity (HCC) 01/13/2011  . Asthma 01/13/2011   Past Medical History:  Diagnosis Date  . Arthritis   . Asthma   . CFS (chronic fatigue syndrome)   . Compression  fracture of vertebral column (HCC) 01/16/2011   per CXR  . DDD (degenerative disc disease), lumbar 01/18/2016  . Diabetes mellitus   . Elevated cholesterol 01/18/2016  . Fibromyalgia 01/18/2016  . Fibromyositis   . Hashimoto's thyroiditis   . HTN (hypertension), benign   . Hypoglycemia associated with diabetes (HCC) 01/16/2011  . Morbid obesity (HCC)   . Osteoarthritis of both knees 01/18/2016  . Pneumonia 12/2010  . Psoriasis 01/18/2016  . Psoriatic arthritis (HCC)   . Thyroid disease     Family History  Problem Relation Age of Onset  . Diabetes Father   . Hypertension Mother   . Stroke Mother   . Cancer Brother   . Cancer Brother     Past Surgical History:  Procedure Laterality Date  . ABDOMINAL HYSTERECTOMY    . ABDOMINAL SURGERY     lapband  . CATARACT EXTRACTION    . KNEE SURGERY     Social History   Occupational History  . Not on file.   Social History Main Topics  . Smoking status: Former Smoker    Years: 20.00    Types: Cigarettes    Quit date: 01/19/1991  . Smokeless tobacco: Never Used  . Alcohol use No  . Drug use: No  . Sexual activity: Not on file

## 2016-05-24 DIAGNOSIS — J302 Other seasonal allergic rhinitis: Secondary | ICD-10-CM | POA: Diagnosis not present

## 2016-05-24 DIAGNOSIS — R69 Illness, unspecified: Secondary | ICD-10-CM | POA: Diagnosis not present

## 2016-05-24 DIAGNOSIS — M545 Low back pain: Secondary | ICD-10-CM | POA: Diagnosis not present

## 2016-05-28 DIAGNOSIS — M545 Low back pain: Secondary | ICD-10-CM | POA: Diagnosis not present

## 2016-05-28 DIAGNOSIS — S42201A Unspecified fracture of upper end of right humerus, initial encounter for closed fracture: Secondary | ICD-10-CM | POA: Diagnosis not present

## 2016-05-28 DIAGNOSIS — H109 Unspecified conjunctivitis: Secondary | ICD-10-CM | POA: Diagnosis not present

## 2016-05-31 DIAGNOSIS — I10 Essential (primary) hypertension: Secondary | ICD-10-CM | POA: Diagnosis not present

## 2016-05-31 DIAGNOSIS — L03031 Cellulitis of right toe: Secondary | ICD-10-CM | POA: Diagnosis not present

## 2016-05-31 DIAGNOSIS — E119 Type 2 diabetes mellitus without complications: Secondary | ICD-10-CM | POA: Diagnosis not present

## 2016-06-01 ENCOUNTER — Telehealth: Payer: Self-pay | Admitting: Rheumatology

## 2016-06-01 NOTE — Telephone Encounter (Signed)
Yes, please schedule patient for a sooner appointment. Thank you!

## 2016-06-01 NOTE — Telephone Encounter (Signed)
Patient called stating that she is in a lot of pain and wanted to know if she could be seen earlier than April 12.  CB#[phone].  She is in a nursing home ([facility])

## 2016-06-04 DIAGNOSIS — L03031 Cellulitis of right toe: Secondary | ICD-10-CM | POA: Diagnosis not present

## 2016-06-04 DIAGNOSIS — S42201A Unspecified fracture of upper end of right humerus, initial encounter for closed fracture: Secondary | ICD-10-CM | POA: Diagnosis not present

## 2016-06-04 DIAGNOSIS — J45909 Unspecified asthma, uncomplicated: Secondary | ICD-10-CM | POA: Diagnosis not present

## 2016-06-04 DIAGNOSIS — R69 Illness, unspecified: Secondary | ICD-10-CM | POA: Diagnosis not present

## 2016-06-04 NOTE — Telephone Encounter (Signed)
Please continue to try to call patient to schedule earlier appointment.

## 2016-06-04 NOTE — Telephone Encounter (Signed)
I attempted to call the patient to move her appointment up earlier than April 12.  There was no answer and no voice mail.

## 2016-06-07 DIAGNOSIS — L600 Ingrowing nail: Secondary | ICD-10-CM | POA: Diagnosis not present

## 2016-06-07 DIAGNOSIS — E1140 Type 2 diabetes mellitus with diabetic neuropathy, unspecified: Secondary | ICD-10-CM | POA: Diagnosis not present

## 2016-06-07 DIAGNOSIS — L97519 Non-pressure chronic ulcer of other part of right foot with unspecified severity: Secondary | ICD-10-CM | POA: Diagnosis not present

## 2016-06-07 DIAGNOSIS — L03031 Cellulitis of right toe: Secondary | ICD-10-CM | POA: Diagnosis not present

## 2016-06-07 DIAGNOSIS — I739 Peripheral vascular disease, unspecified: Secondary | ICD-10-CM | POA: Diagnosis not present

## 2016-06-07 DIAGNOSIS — L0390 Cellulitis, unspecified: Secondary | ICD-10-CM | POA: Diagnosis not present

## 2016-06-07 DIAGNOSIS — I872 Venous insufficiency (chronic) (peripheral): Secondary | ICD-10-CM | POA: Diagnosis not present

## 2016-06-07 DIAGNOSIS — M6281 Muscle weakness (generalized): Secondary | ICD-10-CM | POA: Diagnosis not present

## 2016-06-07 DIAGNOSIS — B351 Tinea unguium: Secondary | ICD-10-CM | POA: Diagnosis not present

## 2016-06-07 DIAGNOSIS — M545 Low back pain: Secondary | ICD-10-CM | POA: Diagnosis not present

## 2016-06-11 ENCOUNTER — Telehealth: Payer: Self-pay | Admitting: Rheumatology

## 2016-06-11 NOTE — Telephone Encounter (Signed)
Patient calling to let you know that she was taking the [place] and MTX once a week, but now she is taking 10mg  of Oxycodone every 4 hours for pain because she took a fall 3 weeks ago and fractured her R humerus and ended up with a lower spine compression fracture.  She has been off of the MTX for 2-3 months, but since the fall she is having extreme pain in both heels and they are painfully sore.  She is requesting to go back on the MTX and for you to advise her of what to do.

## 2016-06-11 NOTE — Telephone Encounter (Signed)
Patient has been scheduled to be seen on 06/13/16.

## 2016-06-11 NOTE — Telephone Encounter (Signed)
We will have to evaluate the patient on 06/13/2016(in 2 days from today) and decide according to findings during our office visit.

## 2016-06-12 DIAGNOSIS — Z79899 Other long term (current) drug therapy: Secondary | ICD-10-CM | POA: Insufficient documentation

## 2016-06-12 NOTE — Progress Notes (Signed)
Office Visit Note  Patient: Ashley Caldwell             Date of Birth: [birth year]           MRN: [id number]             PCP: [name], MD Referring: Benita Stabile, MD Visit Date: 06/13/2016 Occupation: @GUAROCC @    Subjective:  No chief complaint on file.   History of Present Illness: Ashley Caldwell is a 76 y.o. female with history of psoriatic arthritis psoriasis and osteoarthritis. She was accompanied by her daughter today. According to patient she fell in January 2018 and acquired right humerus fracture. She was also diagnosed with L3 vertebral fracture. She's been on pain medication and also going through physical therapy. She denies much discomfort in her lower back. She's been having increased pain in her bilateral heels. She believes it's related to plantar fasciitis. She has had those episodes in the past. She developed a blister on her right second toe. She was seen by podiatrist who discovered that she had some infection on her toes. She is currently on antibiotics for that and is going for wound healing. She has some psoriasis patches on her lower extremities. She denies any joint swelling.  Activities of Daily Living:  Patient reports morning stiffness for1 hour.   Patient Reports nocturnal pain.  Difficulty dressing/grooming: Denies Difficulty climbing stairs: Reports Difficulty getting out of chair: Reports Difficulty using hands for taps, buttons, cutlery, and/or writing: Denies   No Rheumatology ROS completed.   PMFS History:  Patient Active Problem List   Diagnosis Date Noted  . High risk medication use 06/12/2016  . Closed fracture of left proximal humerus 05/01/2016  . History of renal failure 04/26/2016  . Primary osteoarthritis of both knees 04/26/2016  . Renal failure 04/13/2016  . Acute renal failure (HCC) 04/13/2016  . Psoriasis 01/18/2016  . Fibromyalgia 01/18/2016  . DDD (degenerative disc disease), lumbar 01/18/2016  . Osteoarthritis of both  knees 01/18/2016  . Elevated cholesterol 01/18/2016  . Hypoglycemia associated with diabetes (HCC) 01/16/2011  . Compression fracture of vertebral column (HCC) 01/16/2011  . Hyponatremia 01/14/2011  . Pneumonia 01/13/2011  . Hyperglycemia 01/13/2011  . Weakness generalized 01/13/2011  . DM type 2 (diabetes mellitus, type 2) (HCC) 01/13/2011  . HTN (hypertension), benign 01/13/2011  . Psoriatic arthritis (HCC) 01/13/2011  . Anemia 01/13/2011  . Thyroiditis, chronic 01/13/2011  . Morbid obesity (HCC) 01/13/2011  . Asthma 01/13/2011    Past Medical History:  Diagnosis Date  . Arthritis   . Asthma   . CFS (chronic fatigue syndrome)   . Compression fracture of vertebral column (HCC) 01/16/2011   per CXR  . DDD (degenerative disc disease), lumbar 01/18/2016  . Diabetes mellitus   . Elevated cholesterol 01/18/2016  . Fibromyalgia 01/18/2016  . Fibromyositis   . Hashimoto's thyroiditis   . HTN (hypertension), benign   . Hypoglycemia associated with diabetes (HCC) 01/16/2011  . Morbid obesity (HCC)   . Osteoarthritis of both knees 01/18/2016  . Pneumonia 12/2010  . Psoriasis 01/18/2016  . Psoriatic arthritis (HCC)   . Thyroid disease     Family History  Problem Relation Age of Onset  . Diabetes Father   . Hypertension Mother   . Stroke Mother   . Cancer Brother   . Cancer Brother    Past Surgical History:  Procedure Laterality Date  . ABDOMINAL HYSTERECTOMY    . ABDOMINAL SURGERY  lapband  . CATARACT EXTRACTION    . KNEE SURGERY     Social History   Social History Narrative  . No narrative on file     Objective: Vital Signs: BP 130/84   Pulse 88   Resp 16    Physical Exam  Constitutional: She is oriented to person, place, and time. She appears well-developed and well-nourished.  Patient is in wheelchair  HENT:  Head: Normocephalic and atraumatic.  Eyes: Conjunctivae and EOM are normal.  Neck: Normal range of motion.  Cardiovascular: Normal rate, regular  rhythm, normal heart sounds and intact distal pulses.   Pulmonary/Chest: Effort normal and breath sounds normal.  Abdominal: Soft. Bowel sounds are normal.  Unable to palpate me in wheelchair and morbid obesity  Lymphadenopathy:    She has no cervical adenopathy.  Neurological: She is alert and oriented to person, place, and time.  Skin: Skin is warm and dry. Capillary refill takes less than 2 seconds.  Pitting edema over bilateral lower extremities. She has a bandage on her right foot. Erythema and blister formation noted on her right second toe. She also has severe pedal edema.  Psychiatric: She has a normal mood and affect. Her behavior is normal.  Nursing note and vitals reviewed.    Musculoskeletal Exam: C-spine good range of motion. She has thoracic kyphosis. Lumbar spine was difficult to assess she was in wheelchair. She has right humerus fracture and unable to raise her right arm. Left shoulder was good range of motion although joints MCPs PIPs are good range of motion. She had no synovitis in her hands. Knee joints have limited range of motion. Ankle joints are good range of motion. She has not of tenderness on palpation of her feet all over. She has severe pitting edema over bilateral lower extremities. No synovitis was noted. She also has some tenderness over plantar fascia bilaterally.  CDAI Exam: No CDAI exam completed.    Investigation: Findings:  01/19/2016 CMP ALT 35 creatinine 1.21 GFR 44 04/23/2016 CBC hemoglobin 11.0, BMP normal except glucose 125    Imaging: Xr Lumbar Spine 2-3 Views  Result Date: 05/23/2016 Lumbar spine radiographs AP and lateral show compression fracture at L3.  The compression involves 50% loss of height with no retropulsion posteriorly.  Hip joints normal.  Remainder bony pelvis normal.   Speciality Comments: No specialty comments available.    Procedures:  No procedures performed Allergies: Amoxicillin-pot clavulanate; Lyrica  [pregabalin]; and Latex   Assessment / Plan:     Visit Diagnoses: Psoriatic arthritis (HCC): She does not have any synovitis on examination today, her psoriatic arthritis seems to be in remission. I do not see any need for immunosuppressive therapy. I would also advise against immunosuppressive therapy due to increased risk of infection.  Psoriasis: Patient reports few scattered lesions on her lower extremities and could not see any on exam today.  Pedal edema: She has severe pedal edema over bilateral lower extremities up to her knees. This is causing a lot of discomfort. She also has very tight to skin on her feet. She has very tight shoes on today. I've advised her to weigh wear loose fitting shoes which will not compress on her skin as her skin appears to be very fragile.  Plantar fasciitis, bilateral: She's been having some discomfort in bilateral plantar fascia she may benefit from physical therapy. After being very hesitant to give her a cortisone injection she has ongoing infection in her toes.  Primary osteoarthritis of both knees:  She has chronic pain and limited mobility  Morbid obesity (HCC)  Fibromyalgia  Thyroiditis, chronic  History of hypertension  History of hyperlipidemia  History of vertebral fracture - 05/23/2016 L3. At this point she does not appear to be taking any medications. I would recommend use of Forteo to improve her bone mass after obtaining a bone density for indicated.  History of asthma   Orders: No orders of the defined types were placed in this encounter.  No orders of the defined types were placed in this encounter.   Face-to-face time spent with patient was 35 minutes. 50% of time was spent in counseling and coordination of care.  Follow-Up Instructions: No Follow-up on file.   Pollyann Savoy, MD  Note - This record has been created using Animal nutritionist.  Chart creation errors have been sought, but may not always  have been located.  Such creation errors do not reflect on  the standard of medical care.

## 2016-06-13 ENCOUNTER — Ambulatory Visit (INDEPENDENT_AMBULATORY_CARE_PROVIDER_SITE_OTHER): Payer: Medicare HMO | Admitting: Rheumatology

## 2016-06-13 ENCOUNTER — Encounter: Payer: Self-pay | Admitting: Rheumatology

## 2016-06-13 VITALS — BP 130/84 | HR 88 | Resp 16 | Ht 67.0 in | Wt 330.0 lb

## 2016-06-13 DIAGNOSIS — Z8639 Personal history of other endocrine, nutritional and metabolic disease: Secondary | ICD-10-CM | POA: Diagnosis not present

## 2016-06-13 DIAGNOSIS — M722 Plantar fascial fibromatosis: Secondary | ICD-10-CM | POA: Diagnosis not present

## 2016-06-13 DIAGNOSIS — E065 Other chronic thyroiditis: Secondary | ICD-10-CM | POA: Diagnosis not present

## 2016-06-13 DIAGNOSIS — M797 Fibromyalgia: Secondary | ICD-10-CM

## 2016-06-13 DIAGNOSIS — L409 Psoriasis, unspecified: Secondary | ICD-10-CM

## 2016-06-13 DIAGNOSIS — L4050 Arthropathic psoriasis, unspecified: Secondary | ICD-10-CM

## 2016-06-13 DIAGNOSIS — M170 Bilateral primary osteoarthritis of knee: Secondary | ICD-10-CM | POA: Diagnosis not present

## 2016-06-13 DIAGNOSIS — Z8781 Personal history of (healed) traumatic fracture: Secondary | ICD-10-CM

## 2016-06-13 DIAGNOSIS — R600 Localized edema: Secondary | ICD-10-CM | POA: Diagnosis not present

## 2016-06-13 DIAGNOSIS — Z8709 Personal history of other diseases of the respiratory system: Secondary | ICD-10-CM | POA: Diagnosis not present

## 2016-06-13 DIAGNOSIS — Z8679 Personal history of other diseases of the circulatory system: Secondary | ICD-10-CM | POA: Diagnosis not present

## 2016-06-14 DIAGNOSIS — M545 Low back pain: Secondary | ICD-10-CM | POA: Diagnosis not present

## 2016-06-14 DIAGNOSIS — M797 Fibromyalgia: Secondary | ICD-10-CM | POA: Diagnosis not present

## 2016-06-14 DIAGNOSIS — S42201D Unspecified fracture of upper end of right humerus, subsequent encounter for fracture with routine healing: Secondary | ICD-10-CM | POA: Diagnosis not present

## 2016-06-15 DIAGNOSIS — R69 Illness, unspecified: Secondary | ICD-10-CM | POA: Diagnosis not present

## 2016-06-16 DIAGNOSIS — G894 Chronic pain syndrome: Secondary | ICD-10-CM | POA: Diagnosis not present

## 2016-06-16 DIAGNOSIS — Z794 Long term (current) use of insulin: Secondary | ICD-10-CM | POA: Diagnosis not present

## 2016-06-16 DIAGNOSIS — E782 Mixed hyperlipidemia: Secondary | ICD-10-CM | POA: Diagnosis not present

## 2016-06-16 DIAGNOSIS — S42201D Unspecified fracture of upper end of right humerus, subsequent encounter for fracture with routine healing: Secondary | ICD-10-CM | POA: Diagnosis not present

## 2016-06-16 DIAGNOSIS — G3184 Mild cognitive impairment, so stated: Secondary | ICD-10-CM | POA: Diagnosis not present

## 2016-06-16 DIAGNOSIS — J45909 Unspecified asthma, uncomplicated: Secondary | ICD-10-CM | POA: Diagnosis not present

## 2016-06-16 DIAGNOSIS — M069 Rheumatoid arthritis, unspecified: Secondary | ICD-10-CM | POA: Diagnosis not present

## 2016-06-16 DIAGNOSIS — I10 Essential (primary) hypertension: Secondary | ICD-10-CM | POA: Diagnosis not present

## 2016-06-16 DIAGNOSIS — E119 Type 2 diabetes mellitus without complications: Secondary | ICD-10-CM | POA: Diagnosis not present

## 2016-06-16 DIAGNOSIS — E039 Hypothyroidism, unspecified: Secondary | ICD-10-CM | POA: Diagnosis not present

## 2016-06-16 DIAGNOSIS — M797 Fibromyalgia: Secondary | ICD-10-CM | POA: Diagnosis not present

## 2016-06-16 DIAGNOSIS — R69 Illness, unspecified: Secondary | ICD-10-CM | POA: Diagnosis not present

## 2016-06-17 ENCOUNTER — Encounter (HOSPITAL_COMMUNITY): Payer: Self-pay | Admitting: *Deleted

## 2016-06-17 ENCOUNTER — Emergency Department (HOSPITAL_COMMUNITY): Payer: Medicare HMO

## 2016-06-17 ENCOUNTER — Emergency Department (HOSPITAL_COMMUNITY)
Admission: EM | Admit: 2016-06-17 | Discharge: 2016-06-18 | Disposition: A | Discharge status: Home Health | Payer: Medicare HMO | Attending: Emergency Medicine | Admitting: Emergency Medicine

## 2016-06-17 DIAGNOSIS — L97519 Non-pressure chronic ulcer of other part of right foot with unspecified severity: Secondary | ICD-10-CM | POA: Insufficient documentation

## 2016-06-17 DIAGNOSIS — L97509 Non-pressure chronic ulcer of other part of unspecified foot with unspecified severity: Secondary | ICD-10-CM | POA: Diagnosis not present

## 2016-06-17 DIAGNOSIS — E876 Hypokalemia: Secondary | ICD-10-CM | POA: Diagnosis not present

## 2016-06-17 DIAGNOSIS — L0390 Cellulitis, unspecified: Secondary | ICD-10-CM | POA: Diagnosis not present

## 2016-06-17 DIAGNOSIS — Y999 Unspecified external cause status: Secondary | ICD-10-CM | POA: Insufficient documentation

## 2016-06-17 DIAGNOSIS — Z87891 Personal history of nicotine dependence: Secondary | ICD-10-CM | POA: Insufficient documentation

## 2016-06-17 DIAGNOSIS — Z79899 Other long term (current) drug therapy: Secondary | ICD-10-CM | POA: Insufficient documentation

## 2016-06-17 DIAGNOSIS — J45909 Unspecified asthma, uncomplicated: Secondary | ICD-10-CM | POA: Insufficient documentation

## 2016-06-17 DIAGNOSIS — W1839XA Other fall on same level, initial encounter: Secondary | ICD-10-CM | POA: Diagnosis not present

## 2016-06-17 DIAGNOSIS — I10 Essential (primary) hypertension: Secondary | ICD-10-CM | POA: Insufficient documentation

## 2016-06-17 DIAGNOSIS — Y929 Unspecified place or not applicable: Secondary | ICD-10-CM | POA: Diagnosis not present

## 2016-06-17 DIAGNOSIS — Z794 Long term (current) use of insulin: Secondary | ICD-10-CM | POA: Diagnosis not present

## 2016-06-17 DIAGNOSIS — Y939 Activity, unspecified: Secondary | ICD-10-CM | POA: Insufficient documentation

## 2016-06-17 DIAGNOSIS — E1165 Type 2 diabetes mellitus with hyperglycemia: Secondary | ICD-10-CM | POA: Insufficient documentation

## 2016-06-17 DIAGNOSIS — R739 Hyperglycemia, unspecified: Secondary | ICD-10-CM

## 2016-06-17 DIAGNOSIS — W19XXXA Unspecified fall, initial encounter: Secondary | ICD-10-CM

## 2016-06-17 DIAGNOSIS — S42201A Unspecified fracture of upper end of right humerus, initial encounter for closed fracture: Secondary | ICD-10-CM | POA: Diagnosis not present

## 2016-06-17 LAB — BASIC METABOLIC PANEL
Anion gap: 7 (ref 5–15)
BUN: 4 mg/dL — AB (ref 6–20)
CALCIUM: 8.7 mg/dL — AB (ref 8.9–10.3)
CO2: 30 mmol/L (ref 22–32)
CREATININE: 0.67 mg/dL (ref 0.44–1.00)
Chloride: 101 mmol/L (ref 101–111)
GFR calc Af Amer: >60 mL/min (ref >60)
GLUCOSE: 215 mg/dL — AB (ref 65–99)
POTASSIUM: 3.3 mmol/L — AB (ref 3.5–5.1)
SODIUM: 138 mmol/L (ref 135–145)

## 2016-06-17 LAB — CBC WITH DIFFERENTIAL/PLATELET
Basophils Absolute: 0 10*3/uL (ref 0.0–0.1)
Basophils Relative: 0 %
EOS PCT: 4 %
Eosinophils Absolute: 0.2 10*3/uL (ref 0.0–0.7)
HEMATOCRIT: 34.2 % — AB (ref 36.0–46.0)
HEMOGLOBIN: 11 g/dL — AB (ref 12.0–15.0)
LYMPHS ABS: 1.4 10*3/uL (ref 0.7–4.0)
LYMPHS PCT: 20 %
MCH: 28.4 pg (ref 26.0–34.0)
MCHC: 32.2 g/dL (ref 30.0–36.0)
MCV: 88.1 fL (ref 78.0–100.0)
Monocytes Absolute: 0.8 10*3/uL (ref 0.1–1.0)
Monocytes Relative: 11 %
NEUTROS ABS: 4.3 10*3/uL (ref 1.7–7.7)
NEUTROS PCT: 65 %
PLATELETS: 309 10*3/uL (ref 150–400)
RBC: 3.88 MIL/uL (ref 3.87–5.11)
RDW: 15.2 % (ref 11.5–15.5)
WBC: 6.6 10*3/uL (ref 4.0–10.5)

## 2016-06-17 LAB — URINALYSIS, ROUTINE W REFLEX MICROSCOPIC
BILIRUBIN URINE: NEGATIVE
GLUCOSE, UA: 50 mg/dL — AB
HGB URINE DIPSTICK: NEGATIVE
Ketones, ur: 20 mg/dL — AB
Leukocytes, UA: NEGATIVE
Nitrite: NEGATIVE
PROTEIN: NEGATIVE mg/dL
Specific Gravity, Urine: 1.021 (ref 1.005–1.030)
pH: 5 (ref 5.0–8.0)

## 2016-06-17 LAB — CBG MONITORING, ED: Glucose-Capillary: 223 mg/dL — ABNORMAL HIGH (ref 65–99)

## 2016-06-17 MED ORDER — LORATADINE 10 MG PO TABS: 10.0000 mg | ORAL_TABLET | Freq: Every day | ORAL | Status: DC

## 2016-06-17 MED ORDER — SODIUM CHLORIDE 0.9 % IV BOLUS (SEPSIS)
1000.0000 mL | Freq: Once | INTRAVENOUS | Status: AC
  Administered 2016-06-17: 1000 mL via INTRAVENOUS

## 2016-06-17 MED ORDER — NYSTATIN 100000 UNIT/GM EX POWD: 1.0000 g | Freq: Every day | CUTANEOUS | Status: DC | PRN

## 2016-06-17 MED ORDER — OXYCODONE HCL 5 MG PO TABS
5.0000 mg | ORAL_TABLET | Freq: Once | ORAL | Status: AC
Start: 2016-06-17 — End: 2016-06-17
  Administered 2016-06-17: 5 mg via ORAL
  Filled 2016-06-17: qty 1

## 2016-06-17 MED ORDER — MOMETASONE FURO-FORMOTEROL FUM 200-5 MCG/ACT IN AERO: 2.0000 | INHALATION_SPRAY | Freq: Two times a day (BID) | RESPIRATORY_TRACT | Status: DC

## 2016-06-17 MED ORDER — INSULIN GLARGINE 100 UNIT/ML ~~LOC~~ SOLN: 35.0000 [IU] | Freq: Two times a day (BID) | SUBCUTANEOUS | Status: DC

## 2016-06-17 MED ORDER — OXYCODONE HCL 5 MG PO TABS
10.0000 mg | ORAL_TABLET | Freq: Four times a day (QID) | ORAL | Status: DC
  Administered 2016-06-18 (×2): 10 mg via ORAL
  Filled 2016-06-17 (×2): qty 2

## 2016-06-17 MED ORDER — FLUTICASONE FUROATE-VILANTEROL 100-25 MCG/INH IN AEPB: 1.0000 | INHALATION_SPRAY | Freq: Every day | RESPIRATORY_TRACT | Status: DC

## 2016-06-17 MED ORDER — OXYCODONE HCL 5 MG PO TABS
5.0000 mg | ORAL_TABLET | Freq: Once | ORAL | Status: AC
  Administered 2016-06-17: 5 mg via ORAL
  Filled 2016-06-17: qty 1

## 2016-06-17 MED ORDER — DULOXETINE HCL 30 MG PO CPEP
60.0000 mg | ORAL_CAPSULE | Freq: Two times a day (BID) | ORAL | Status: DC
  Administered 2016-06-17 – 2016-06-18 (×2): 60 mg via ORAL
  Filled 2016-06-17 (×2): qty 2

## 2016-06-17 MED ORDER — MONTELUKAST SODIUM 10 MG PO TABS: 10.0000 mg | ORAL_TABLET | Freq: Every day | ORAL | Status: DC

## 2016-06-17 MED ORDER — INSULIN ASPART 100 UNIT/ML ~~LOC~~ SOLN
0.0000 [IU] | Freq: Four times a day (QID) | SUBCUTANEOUS | Status: DC
  Administered 2016-06-18 (×2): 3 [IU] via SUBCUTANEOUS
  Filled 2016-06-17 (×2): qty 1

## 2016-06-17 MED ORDER — ALBUTEROL SULFATE HFA 108 (90 BASE) MCG/ACT IN AERS: 2.0000 | INHALATION_SPRAY | RESPIRATORY_TRACT | Status: DC | PRN

## 2016-06-17 MED ORDER — ACETAMINOPHEN 325 MG PO TABS
ORAL_TABLET | ORAL | Status: AC
  Filled 2016-06-17: qty 2

## 2016-06-17 MED ORDER — POTASSIUM CHLORIDE CRYS ER 20 MEQ PO TBCR
40.0000 meq | EXTENDED_RELEASE_TABLET | Freq: Once | ORAL | Status: AC
  Administered 2016-06-17: 40 meq via ORAL
  Filled 2016-06-17: qty 2

## 2016-06-17 MED ORDER — CLINDAMYCIN HCL 150 MG PO CAPS: 300.0000 mg | ORAL_CAPSULE | Freq: Once | ORAL | Status: DC

## 2016-06-17 MED ORDER — CLINDAMYCIN HCL 150 MG PO CAPS
300.0000 mg | ORAL_CAPSULE | Freq: Three times a day (TID) | ORAL | Status: DC
  Administered 2016-06-17 – 2016-06-18 (×3): 300 mg via ORAL
  Filled 2016-06-17 (×3): qty 2

## 2016-06-17 NOTE — ED Notes (Signed)
ED Provider at bedside. 

## 2016-06-17 NOTE — ED Triage Notes (Signed)
Pt comes in for elevated blood sugar. Pt obtained a reading of 300 at home and took 8 units of novolog at 1815. CBG is now 223.   EDP at bedside.

## 2016-06-17 NOTE — ED Provider Notes (Signed)
[facility]-EMERGENCY DEPT Provider Note   CSN: [id number] Arrival date & time: 06/17/16  1912     History   Chief Complaint Chief Complaint  Patient presents with  . Hyperglycemia    HPI Ashley Caldwell is a 76 y.o. female. History is obtained from patient and from patient's daughter. His blood sugar was reportedly 340 today while at home. Her daughter goes on to state that patient was discharged from a skilled nursing facility for rehabilitation 2 days ago and that she is unable to care for her. She is unable to walk with walker due to her recent fractured humerus and refuses to use her bedside commode. She fell yesterday. No new injury however she does complain of severe pain at her right upper arm today. Pain worse with attempting to move her arm and improved with remaining still She suffers from right arm upper pain as result of recent fractured humerus  HPI  Past Medical History:  Diagnosis Date  . Arthritis   . Asthma   . CFS (chronic fatigue syndrome)   . Compression fracture of vertebral column (HCC) 01/16/2011   per CXR  . DDD (degenerative disc disease), lumbar 01/18/2016  . Diabetes mellitus   . Elevated cholesterol 01/18/2016  . Fibromyalgia 01/18/2016  . Fibromyositis   . Hashimoto's thyroiditis   . HTN (hypertension), benign   . Hypoglycemia associated with diabetes (HCC) 01/16/2011  . Morbid obesity (HCC)   . Osteoarthritis of both knees 01/18/2016  . Pneumonia 12/2010  . Psoriasis 01/18/2016  . Psoriatic arthritis (HCC)   . Thyroid disease     Patient Active Problem List   Diagnosis Date Noted  . High risk medication use 06/12/2016  . Closed fracture of left proximal humerus 05/01/2016  . History of renal failure 04/26/2016  . Primary osteoarthritis of both knees 04/26/2016  . Renal failure 04/13/2016  . Acute renal failure (HCC) 04/13/2016  . Psoriasis 01/18/2016  . Fibromyalgia 01/18/2016  . DDD (degenerative disc disease), lumbar 01/18/2016  .  Osteoarthritis of both knees 01/18/2016  . Elevated cholesterol 01/18/2016  . Hypoglycemia associated with diabetes (HCC) 01/16/2011  . Compression fracture of vertebral column (HCC) 01/16/2011  . Hyponatremia 01/14/2011  . Pneumonia 01/13/2011  . Hyperglycemia 01/13/2011  . Weakness generalized 01/13/2011  . DM type 2 (diabetes mellitus, type 2) (HCC) 01/13/2011  . HTN (hypertension), benign 01/13/2011  . Psoriatic arthritis (HCC) 01/13/2011  . Anemia 01/13/2011  . Thyroiditis, chronic 01/13/2011  . Morbid obesity (HCC) 01/13/2011  . Asthma 01/13/2011    Past Surgical History:  Procedure Laterality Date  . ABDOMINAL HYSTERECTOMY    . ABDOMINAL SURGERY     lapband  . CATARACT EXTRACTION    . KNEE SURGERY      OB History    Gravida Para Term Preterm AB Living             2   SAB TAB Ectopic Multiple Live Births                   Home Medications    Prior to Admission medications   Medication Sig Start Date End Date Taking? Authorizing Provider  ACIDOPHILUS LACTOBACILLUS PO Take by mouth.    Historical Provider, MD  ADVAIR DISKUS 250-50 MCG/DOSE AEPB Inhale 1 puff into the lungs 2 (two) times daily. 04/04/16   Historical Provider, MD  albuterol (PROVENTIL HFA;VENTOLIN HFA) 108 (90 BASE) MCG/ACT inhaler Inhale 2 puffs into the lungs every 4 (four) hours as needed for  wheezing or shortness of breath. 01/16/11 01/16/12  Elliot Cousin, MD  buprenorphine (BUTRANS) 10 MCG/HR PTWK patch Place 1 patch (10 mcg total) onto the skin once a week. 04/27/16   Vickki Hearing, MD  cetirizine (ZYRTEC) 10 MG tablet Take 10 mg by mouth daily.      Historical Provider, MD  clindamycin (CLEOCIN) 300 MG capsule  06/10/16   Historical Provider, MD  clobetasol cream (TEMOVATE) 0.05 % Apply 1 application topically 2 (two) times daily as needed. Patient not taking: Reported on 06/13/2016 01/19/16   Naitik Panwala, PA-C  cyclobenzaprine (FLEXERIL) 5 MG tablet Take 1 tablet (5 mg total) by mouth at  bedtime. 04/16/16   Leroy Sea, MD  DULoxetine (CYMBALTA) 60 MG capsule Take 1 capsule (60 mg total) by mouth 2 (two) times daily. 01/19/16 07/17/16  Naitik Panwala, PA-C  fluticasone furoate-vilanterol (BREO ELLIPTA) 100-25 MCG/INH AEPB Inhale 1 puff into the lungs daily.    Historical Provider, MD  folic acid (FOLVITE) 1 MG tablet Take 2 tablets (2 mg total) by mouth daily. Patient not taking: Reported on 06/13/2016 01/19/16 04/13/17  Naitik Panwala, PA-C  furosemide (LASIX) 20 MG tablet Take 1 tablet (20 mg total) by mouth daily as needed for fluid. 04/16/16   Leroy Sea, MD  guaiFENesin (ROBITUSSIN) 100 MG/5ML liquid Take 200 mg by mouth 3 (three) times daily as needed for cough.    Historical Provider, MD  insulin aspart (NOVOLOG) 100 UNIT/ML injection Before each meal 3 times a day, 140-199 - 2 units, 200-250 - 4 units, 251-299 - 6 units,  300-349 - 8 units,  350 or above 10 units. Dispense syringes and needles as needed, Ok to switch to PEN if approved. Substitute to any brand approved. DX DM2, Code E11.65 Patient not taking: Reported on 06/13/2016 04/16/16   Leroy Sea, MD  insulin aspart (NOVOLOG) 100 UNIT/ML injection Before each meal 3 times a day, 140-199 - 2 units, 200-250 - 4 units, 251-299 - 6 units,  300-349 - 8 units,  350 or above 10 units. Dispense syringes and needles as needed, Ok to switch to PEN if approved. Substitute to any brand approved. DX DM2, Code E11.65 Patient not taking: Reported on 06/13/2016 04/16/16   Leroy Sea, MD  insulin glargine (LANTUS) 100 UNIT/ML injection Inject 0.35 mLs (35 Units total) into the skin 2 (two) times daily. 04/16/16   Leroy Sea, MD  insulin lispro (HUMALOG) 100 UNIT/ML injection Inject 0.03 mLs (3 Units total) into the skin 3 (three) times daily before meals. Plus the sliding scale with meals only 04/16/16   Leroy Sea, MD  levothyroxine (SYNTHROID, LEVOTHROID) 137 MCG tablet Take 137 mcg by mouth daily.      Historical  Provider, MD  Menthol, Topical Analgesic, (BIOFREEZE EX) Apply topically.    Historical Provider, MD  methotrexate (RHEUMATREX) 2.5 MG tablet TAKE 8 TABLETS EACH WEEK. Patient not taking: Reported on 06/13/2016 03/07/16   Pollyann Savoy, MD  montelukast (SINGULAIR) 10 MG tablet Take 10 mg by mouth daily.      Historical Provider, MD  nystatin (MYCOSTATIN/NYSTOP) powder Apply 1 g topically daily as needed (yeast infection).  01/05/16   Historical Provider, MD  oxyCODONE (OXY IR/ROXICODONE) 5 MG immediate release tablet Take 1 tablet (5 mg total) by mouth 4 (four) times daily. 04/16/16   Leroy Sea, MD  silver sulfADIAZINE (SILVADENE) 1 % cream Apply 1 application topically daily.    Historical Provider, MD  zolpidem (  AMBIEN) 10 MG tablet  04/04/16   Historical Provider, MD    Family History Family History  Problem Relation Age of Onset  . Diabetes Father   . Hypertension Mother   . Stroke Mother   . Cancer Brother   . Cancer Brother     Social History Social History  Substance Use Topics  . Smoking status: Former Smoker    Years: 20.00    Types: Cigarettes    Quit date: 01/19/1991  . Smokeless tobacco: Never Used  . Alcohol use No   DO NOT RESUSCITATE CODE STATUS  Allergies   Amoxicillin-pot clavulanate; Lyrica [pregabalin]; and Latex   Review of Systems Review of Systems  Constitutional: Negative.   HENT: Negative.   Respiratory: Negative.   Cardiovascular: Negative.   Gastrointestinal: Negative.   Musculoskeletal: Negative.   Skin: Positive for rash and wound.       Chronic wounds second toe of right foot and currently being treated for cellulitis of legs  Allergic/Immunologic: Positive for immunocompromised state.       Diabetic  Neurological: Negative.   Psychiatric/Behavioral: Negative.  Negative for self-injury.  All other systems reviewed and are negative.    Physical Exam Updated Vital Signs BP 122/66 (BP Location: Right Arm)   Pulse 87   Temp  98.3 F (36.8 C) (Oral)   Resp 18   Ht 5\' 8"  (1.727 m)   Wt (!) 330 lb (149.7 kg)   SpO2 94%   BMI 50.18 kg/m   Physical Exam  Constitutional: She is oriented to person, place, and time. No distress.  Chronically ill appearing  HENT:  Head: Normocephalic and atraumatic.  Mucus membranes dry  Eyes: Conjunctivae are normal. Pupils are equal, round, and reactive to light.  Neck: Neck supple. No tracheal deviation present. No thyromegaly present.  Cardiovascular: Normal rate and regular rhythm.   No murmur heard. Pulmonary/Chest: Effort normal and breath sounds normal.  Abdominal: Soft. Bowel sounds are normal. She exhibits no distension. There is no tenderness.  Morbidly obese  Genitourinary:  Genitourinary Comments: External genitalia grossly normal. There are name grossly normal  Musculoskeletal: Normal range of motion. She exhibits no edema or tenderness.  Right upper extremity with tenderness at upper arm, middle third. No crepitance. Radial pulse 2+. Not red or warm. Good capillary refill. Right lower extremity with 2 mm ulcer at dorsal aspect of second toe bilateral lower extremities reddened below the knees. With trace edema. No red streaks proximally.  Neurological: She is alert and oriented to person, place, and time. Coordination normal.  Skin: Skin is warm and dry. Rash noted.  biLateral lower extremities reddened below the knees bilaterally. With ulcer on second toe right foot as described above are neurovascular intact.  Psychiatric: She has a normal mood and affect.  Nursing note and vitals reviewed.    ED Treatments / Results  Labs (all labs ordered are listed, but only abnormal results are displayed) Labs Reviewed  CBC WITH DIFFERENTIAL/PLATELET - Abnormal; Notable for the following:       Result Value   Hemoglobin 11.0 (*)    HCT 34.2 (*)    All other components within normal limits  BASIC METABOLIC PANEL - Abnormal; Notable for the following:    Potassium  3.3 (*)    Glucose, Bld 215 (*)    BUN 4 (*)    Calcium 8.7 (*)    All other components within normal limits  CBG MONITORING, ED - Abnormal; Notable for the following:  Glucose-Capillary 223 (*)    All other components within normal limits  URINALYSIS, ROUTINE W REFLEX MICROSCOPIC   Results for orders placed or performed during the hospital encounter of 06/17/16  CBC with Differential/Platelet  Result Value Ref Range   WBC 6.6 4.0 - 10.5 K/uL   RBC 3.88 3.87 - 5.11 MIL/uL   Hemoglobin 11.0 (L) 12.0 - 15.0 g/dL   HCT 07.3 (L) 71.0 - 62.6 %   MCV 88.1 78.0 - 100.0 fL   MCH 28.4 26.0 - 34.0 pg   MCHC 32.2 30.0 - 36.0 g/dL   RDW 94.8 54.6 - 27.0 %   Platelets 309 150 - 400 K/uL   Neutrophils Relative % 65 %   Neutro Abs 4.3 1.7 - 7.7 K/uL   Lymphocytes Relative 20 %   Lymphs Abs 1.4 0.7 - 4.0 K/uL   Monocytes Relative 11 %   Monocytes Absolute 0.8 0.1 - 1.0 K/uL   Eosinophils Relative 4 %   Eosinophils Absolute 0.2 0.0 - 0.7 K/uL   Basophils Relative 0 %   Basophils Absolute 0.0 0.0 - 0.1 K/uL  Basic metabolic panel  Result Value Ref Range   Sodium 138 135 - 145 mmol/L   Potassium 3.3 (L) 3.5 - 5.1 mmol/L   Chloride 101 101 - 111 mmol/L   CO2 30 22 - 32 mmol/L   Glucose, Bld 215 (H) 65 - 99 mg/dL   BUN 4 (L) 6 - 20 mg/dL   Creatinine, Ser 3.50 0.44 - 1.00 mg/dL   Calcium 8.7 (L) 8.9 - 10.3 mg/dL   GFR calc non Af Amer >60 >60 mL/min   GFR calc Af Amer >60 >60 mL/min   Anion gap 7 5 - 15  Urinalysis, Routine w reflex microscopic  Result Value Ref Range   Color, Urine AMBER (A) YELLOW   APPearance CLEAR CLEAR   Specific Gravity, Urine 1.021 1.005 - 1.030   pH 5.0 5.0 - 8.0   Glucose, UA 50 (A) NEGATIVE mg/dL   Hgb urine dipstick NEGATIVE NEGATIVE   Bilirubin Urine NEGATIVE NEGATIVE   Ketones, ur 20 (A) NEGATIVE mg/dL   Protein, ur NEGATIVE NEGATIVE mg/dL   Nitrite NEGATIVE NEGATIVE   Leukocytes, UA NEGATIVE NEGATIVE  CBG monitoring, ED  Result Value Ref Range    Glucose-Capillary 223 (H) 65 - 99 mg/dL   Xr Lumbar Spine 2-3 Views  Result Date: 05/23/2016 Lumbar spine radiographs AP and lateral show compression fracture at L3.  The compression involves 50% loss of height with no retropulsion posteriorly.  Hip joints normal.  Remainder bony pelvis normal.  EKG  EKG Interpretation None       Radiology No results found.  Procedures Procedures (including critical care time)  Medications Ordered in ED Medications  sodium chloride 0.9 % bolus 1,000 mL (not administered)     Initial Impression / Assessment and Plan / ED Course  I have reviewed the triage vital signs and the nursing notes.  Pertinent labs & imaging results that were available during my care of the patient were reviewed by me and considered in my medical decision making (see chart for details).     I don't feel the patient requires admission to the hospital based on today's exam and laboratory values. She is administered oxycodone for pain. I discussed insulin regimen with hospital pharmacist and decide on sliding scale insulin with 6 hour interval CBGs. Potassium supplemented orally. She is receiving oral antibiotics for supposed cellulitis of lower extremities. She at  this point is refusing nursing home care however may benefit from home health care. She'll spend the night in the emergency Department social work to consult for tomorrow morning Patient signed out to Dr.Wentz 10:25 PM who will check x-ray of right humerus Final Clinical Impressions(s) / ED Diagnoses  Diagnosis #1 fall #2 hyperglycemia #3 hypokalemia #4 cellulitis #5 toe ulcer Final diagnoses:  None    New Prescriptions New Prescriptions   No medications on file     Doug Sou, MD 06/17/16 2232

## 2016-06-18 LAB — CBG MONITORING, ED
GLUCOSE-CAPILLARY: 101 mg/dL — AB (ref 65–99)
GLUCOSE-CAPILLARY: 170 mg/dL — AB (ref 65–99)
Glucose-Capillary: 154 mg/dL — ABNORMAL HIGH (ref 65–99)
Glucose-Capillary: 160 mg/dL — ABNORMAL HIGH (ref 65–99)

## 2016-06-18 NOTE — NC FL2 (Signed)
[place] MEDICAID FL2 LEVEL OF CARE SCREENING TOOL     IDENTIFICATION  Patient Name: Ashley Caldwell Birthdate: 1940/08/07 Sex: female Admission Date (Current Location): 06/17/2016  [facility] and [place] Number:  [employer] and Address:  [facility],  [address] [place] [address]      Provider Number: [id number]  Attending Physician Name and Address:  Provider Default, MD  Relative Name and Phone Number:       Current Level of Care:   Recommended Level of Care:   Prior Approval Number:    Date Approved/Denied:   PASRR Number: [id number]  Discharge Plan: SNF    Current Diagnoses: Patient Active Problem List   Diagnosis Date Noted  . High risk medication use 06/12/2016  . Closed fracture of left proximal humerus 05/01/2016  . History of renal failure 04/26/2016  . Primary osteoarthritis of both knees 04/26/2016  . Renal failure 04/13/2016  . Acute renal failure (HCC) 04/13/2016  . Psoriasis 01/18/2016  . Fibromyalgia 01/18/2016  . DDD (degenerative disc disease), lumbar 01/18/2016  . Osteoarthritis of both knees 01/18/2016  . Elevated cholesterol 01/18/2016  . Hypoglycemia associated with diabetes (HCC) 01/16/2011  . Compression fracture of vertebral column (HCC) 01/16/2011  . Hyponatremia 01/14/2011  . Pneumonia 01/13/2011  . Hyperglycemia 01/13/2011  . Weakness generalized 01/13/2011  . DM type 2 (diabetes mellitus, type 2) (HCC) 01/13/2011  . HTN (hypertension), benign 01/13/2011  . Psoriatic arthritis (HCC) 01/13/2011  . Anemia 01/13/2011  . Thyroiditis, chronic 01/13/2011  . Morbid obesity (HCC) 01/13/2011  . Asthma 01/13/2011    Orientation RESPIRATION BLADDER Height & Weight     Self, Time, Situation, Place  Normal Continent Weight: (!) 330 lb (149.7 kg) Height:  5\' 8"  (172.7 cm)  BEHAVIORAL SYMPTOMS/MOOD NEUROLOGICAL BOWEL NUTRITION STATUS      Continent Diet (see discharge summary)  AMBULATORY STATUS COMMUNICATION  OF NEEDS Skin   Extensive Assist Verbally Other (Comment) (Chronic wounds; second toe of right foot, cellulitis of legs.)                       Personal Care Assistance Level of Assistance  Bathing, Feeding, Dressing Bathing Assistance: Maximum assistance Feeding assistance: Independent Dressing Assistance: Maximum assistance     Functional Limitations Info  Sight, Hearing, Speech Sight Info: Adequate Hearing Info: Adequate Speech Info: Adequate    SPECIAL CARE FACTORS FREQUENCY                       Contractures Contractures Info: Not present    Additional Factors Info  Code Status, Allergies, Psychotropic, Insulin Sliding Scale Code Status Info: DNR Allergies Info:  (Amoxicillin-pot Clavulanate, Lyrica, Latex) Psychotropic Info: Cymbalta         Current Medications (06/18/2016):  This is the current hospital active medication list Current Facility-Administered Medications  Medication Dose Route Frequency Provider Last Rate Last Dose  . albuterol (PROVENTIL HFA;VENTOLIN HFA) 108 (90 Base) MCG/ACT inhaler 2 puff  2 puff Inhalation Q4H PRN 08/18/2016, MD      . clindamycin (CLEOCIN) capsule 300 mg  300 mg Oral Q8H Doug Sou, MD   300 mg at 06/18/16 08/18/16  . DULoxetine (CYMBALTA) DR capsule 60 mg  60 mg Oral BID 7482, MD   60 mg at 06/18/16 1004  . insulin aspart (novoLOG) injection 0-15 Units  0-15 Units Subcutaneous Q6H 05-18-1984, MD   3 Units at 06/18/16 0017  .  oxyCODONE (Oxy IR/ROXICODONE) immediate release tablet 10 mg  10 mg Oral QID Doug Sou, MD   10 mg at 06/18/16 1004   Current Outpatient Prescriptions  Medication Sig Dispense Refill  . ACIDOPHILUS LACTOBACILLUS PO Take by mouth.    . ADVAIR DISKUS 250-50 MCG/DOSE AEPB Inhale 1 puff into the lungs 2 (two) times daily.    Marland Kitchen albuterol (PROVENTIL HFA;VENTOLIN HFA) 108 (90 BASE) MCG/ACT inhaler Inhale 2 puffs into the lungs every 4 (four) hours as needed for wheezing or shortness  of breath. 1 Inhaler 1  . buprenorphine (BUTRANS) 10 MCG/HR PTWK patch Place 1 patch (10 mcg total) onto the skin once a week. 6 patch 0  . cetirizine (ZYRTEC) 10 MG tablet Take 10 mg by mouth daily.      . clindamycin (CLEOCIN) 300 MG capsule     . clobetasol cream (TEMOVATE) 0.05 % Apply 1 application topically 2 (two) times daily as needed. (Patient not taking: Reported on 06/13/2016) 30 g 2  . cyclobenzaprine (FLEXERIL) 5 MG tablet Take 1 tablet (5 mg total) by mouth at bedtime. 30 tablet 0  . DULoxetine (CYMBALTA) 60 MG capsule Take 1 capsule (60 mg total) by mouth 2 (two) times daily. 60 capsule 5  . fluticasone furoate-vilanterol (BREO ELLIPTA) 100-25 MCG/INH AEPB Inhale 1 puff into the lungs daily.    . folic acid (FOLVITE) 1 MG tablet Take 2 tablets (2 mg total) by mouth daily. (Patient not taking: Reported on 06/13/2016) 180 tablet 4  . furosemide (LASIX) 20 MG tablet Take 1 tablet (20 mg total) by mouth daily as needed for fluid. 30 tablet   . guaiFENesin (ROBITUSSIN) 100 MG/5ML liquid Take 200 mg by mouth 3 (three) times daily as needed for cough.    . insulin aspart (NOVOLOG) 100 UNIT/ML injection Before each meal 3 times a day, 140-199 - 2 units, 200-250 - 4 units, 251-299 - 6 units,  300-349 - 8 units,  350 or above 10 units. Dispense syringes and needles as needed, Ok to switch to PEN if approved. Substitute to any brand approved. DX DM2, Code E11.65 (Patient not taking: Reported on 06/13/2016) 1 vial 0  . insulin aspart (NOVOLOG) 100 UNIT/ML injection Before each meal 3 times a day, 140-199 - 2 units, 200-250 - 4 units, 251-299 - 6 units,  300-349 - 8 units,  350 or above 10 units. Dispense syringes and needles as needed, Ok to switch to PEN if approved. Substitute to any brand approved. DX DM2, Code E11.65 (Patient not taking: Reported on 06/13/2016) 1 vial 12  . insulin glargine (LANTUS) 100 UNIT/ML injection Inject 0.35 mLs (35 Units total) into the skin 2 (two) times daily. 10 mL 0  .  insulin lispro (HUMALOG) 100 UNIT/ML injection Inject 0.03 mLs (3 Units total) into the skin 3 (three) times daily before meals. Plus the sliding scale with meals only 10 mL 0  . levothyroxine (SYNTHROID, LEVOTHROID) 137 MCG tablet Take 137 mcg by mouth daily.      . Menthol, Topical Analgesic, (BIOFREEZE EX) Apply topically.    . methotrexate (RHEUMATREX) 2.5 MG tablet TAKE 8 TABLETS EACH WEEK. (Patient not taking: Reported on 06/13/2016) 96 tablet 0  . montelukast (SINGULAIR) 10 MG tablet Take 10 mg by mouth daily.      Marland Kitchen nystatin (MYCOSTATIN/NYSTOP) powder Apply 1 g topically daily as needed (yeast infection).     Marland Kitchen oxyCODONE (OXY IR/ROXICODONE) 5 MG immediate release tablet Take 1 tablet (5 mg total) by mouth  4 (four) times daily. (Patient taking differently: Take 10 mg by mouth 4 (four) times daily. ) 15 tablet 0  . silver sulfADIAZINE (SILVADENE) 1 % cream Apply 1 application topically daily.    Marland Kitchen zolpidem (AMBIEN) 10 MG tablet        Discharge Medications: Please see discharge summary for a list of discharge medications.  Relevant Imaging Results:  Relevant Lab Results:   Additional Information SSN [id number] Juleen China, LCSW

## 2016-06-18 NOTE — ED Notes (Signed)
Patient given a meal tray, ginger ale, and apple juice at this time.

## 2016-06-18 NOTE — ED Provider Notes (Signed)
Social work consult obtained. Patient has been accepted at [facility] in [place], [place].   Donnetta Hutching, MD 06/18/16 1324

## 2016-06-18 NOTE — Discharge Instructions (Signed)
Patient has been accepted at [facility] in [place]

## 2016-06-18 NOTE — ED Notes (Signed)
Patient family called NT in there upset saying her mother has been in the ED all night and that she needs repositioned on a Hospital bed that the stretcher was too hard on her mothers back. Patient states she would just like to be either up in a chair or scooted on the stretcher because she was laying on the railing. NT went and got the recliner chair and 2 assist helped patient into the reliner and propped a pillow under her left arm which patient states is broken and one behind her neck and shoulder blades. Family also states that patient needs pain medication which RN notified and stated it wasn't due till 1000. Family also states patient is diabetic and that patient hasn't ate since 1500 yesterday 06/17/16. NT called Dietary and had a tray ordered and changed patient back into her own gown that she insisted on. After speaking to the RN and order a tray NT went back into the patients room around 0940 told the patient that a breakfast tray had been ordered and the RN would be in at 1000 with pain medicine and that Social work had called she will be down to speak with them after while. Family seemed okay with everything when NT left the room.

## 2016-06-18 NOTE — ED Notes (Signed)
Lindwood Coke with  care management stated will call daughter at home.

## 2016-06-18 NOTE — Care Management (Signed)
Consult received. CM discussed plan with pt's daughter who states pt is able to make her own decisions but unable to care for herself. She says her mother will refuse placement. Pt's daughter is unable to care for her and will not take her home. She was tearful during our conversation and does not want to abandon her mother but states her mother is not safe at home and she will not place her at risk to hurt herself. CSW also consulted and will see pt/family in ED.

## 2016-06-18 NOTE — Clinical Social Work Note (Signed)
After much bickering with daughter patient was agreeable to go to SNF private pay. Patient provided list of interested facilities. Patient accepted bed at [facility].  LCSW provided payment details patient and daughter as well as directions to facility.  LCSW signing off.       Nhu Glasby, Juleen China, LCSW

## 2016-06-21 DIAGNOSIS — L97519 Non-pressure chronic ulcer of other part of right foot with unspecified severity: Secondary | ICD-10-CM | POA: Diagnosis not present

## 2016-06-21 DIAGNOSIS — L0390 Cellulitis, unspecified: Secondary | ICD-10-CM | POA: Diagnosis not present

## 2016-06-21 DIAGNOSIS — R609 Edema, unspecified: Secondary | ICD-10-CM | POA: Diagnosis not present

## 2016-06-21 DIAGNOSIS — M2042 Other hammer toe(s) (acquired), left foot: Secondary | ICD-10-CM | POA: Diagnosis not present

## 2016-06-22 DIAGNOSIS — G8929 Other chronic pain: Secondary | ICD-10-CM | POA: Diagnosis not present

## 2016-06-27 ENCOUNTER — Telehealth (INDEPENDENT_AMBULATORY_CARE_PROVIDER_SITE_OTHER): Payer: Self-pay | Admitting: Orthopedic Surgery

## 2016-06-27 DIAGNOSIS — L4059 Other psoriatic arthropathy: Secondary | ICD-10-CM | POA: Diagnosis not present

## 2016-06-27 DIAGNOSIS — R5381 Other malaise: Secondary | ICD-10-CM | POA: Diagnosis not present

## 2016-06-27 DIAGNOSIS — S32000A Wedge compression fracture of unspecified lumbar vertebra, initial encounter for closed fracture: Secondary | ICD-10-CM | POA: Diagnosis not present

## 2016-06-27 DIAGNOSIS — G8929 Other chronic pain: Secondary | ICD-10-CM | POA: Diagnosis not present

## 2016-06-27 NOTE — Telephone Encounter (Signed)
error 

## 2016-06-28 ENCOUNTER — Ambulatory Visit: Payer: Medicare HMO | Admitting: Rheumatology

## 2016-06-29 DIAGNOSIS — R69 Illness, unspecified: Secondary | ICD-10-CM | POA: Diagnosis not present

## 2016-06-29 DIAGNOSIS — G8929 Other chronic pain: Secondary | ICD-10-CM | POA: Diagnosis not present

## 2016-07-04 ENCOUNTER — Ambulatory Visit (INDEPENDENT_AMBULATORY_CARE_PROVIDER_SITE_OTHER): Payer: Medicare HMO | Admitting: Orthopedic Surgery

## 2016-07-04 ENCOUNTER — Ambulatory Visit (INDEPENDENT_AMBULATORY_CARE_PROVIDER_SITE_OTHER): Payer: Medicare HMO

## 2016-07-04 DIAGNOSIS — R600 Localized edema: Secondary | ICD-10-CM | POA: Diagnosis not present

## 2016-07-04 DIAGNOSIS — S42291A Other displaced fracture of upper end of right humerus, initial encounter for closed fracture: Secondary | ICD-10-CM

## 2016-07-04 DIAGNOSIS — I10 Essential (primary) hypertension: Secondary | ICD-10-CM | POA: Diagnosis not present

## 2016-07-04 DIAGNOSIS — E119 Type 2 diabetes mellitus without complications: Secondary | ICD-10-CM | POA: Diagnosis not present

## 2016-07-04 DIAGNOSIS — R69 Illness, unspecified: Secondary | ICD-10-CM | POA: Diagnosis not present

## 2016-07-06 ENCOUNTER — Encounter (INDEPENDENT_AMBULATORY_CARE_PROVIDER_SITE_OTHER): Payer: Self-pay | Admitting: Orthopedic Surgery

## 2016-07-06 DIAGNOSIS — E559 Vitamin D deficiency, unspecified: Secondary | ICD-10-CM | POA: Diagnosis not present

## 2016-07-06 NOTE — Progress Notes (Signed)
Post-Op Visit Note   Patient: Ashley Caldwell           Date of Birth: [birth year]           MRN: [id number] Visit Date: 07/04/2016 PCP: [name], MD   Assessment & Plan:  Chief Complaint: No chief complaint on file.  Visit Diagnoses:  1. Other closed displaced fracture of proximal end of right humerus, initial encounter     Plan: Ashley Caldwell is a 76 year old patient with right proximal humerus fracture.  This was a severe and significant fracture.  On exam the fracture does move as a unit and she has fairly reasonable passive range of motion without coarseness which I would potentially expect based on the head splitting nature of the fracture.  She is adamant about nonoperative treatment.  She has the expected functional limitations with pretty limited abduction and forward flexion both around 30.  Plan is to continue rehabilitation and physical therapy and OT.  See her back in 3 months for clinical recheck.  Follow-Up Instructions: No Follow-up on file.   Orders:  Orders Placed This Encounter  Procedures  . XR Humerus Right   No orders of the defined types were placed in this encounter.   Imaging: No results found.  PMFS History: Patient Active Problem List   Diagnosis Date Noted  . High risk medication use 06/12/2016  . Closed fracture of right proximal humerus 05/01/2016  . History of renal failure 04/26/2016  . Primary osteoarthritis of both knees 04/26/2016  . Renal failure 04/13/2016  . Acute renal failure (HCC) 04/13/2016  . Psoriasis 01/18/2016  . Fibromyalgia 01/18/2016  . DDD (degenerative disc disease), lumbar 01/18/2016  . Osteoarthritis of both knees 01/18/2016  . Elevated cholesterol 01/18/2016  . Hypoglycemia associated with diabetes (HCC) 01/16/2011  . Compression fracture of vertebral column (HCC) 01/16/2011  . Hyponatremia 01/14/2011  . Pneumonia 01/13/2011  . Hyperglycemia 01/13/2011  . Weakness generalized 01/13/2011  . DM type 2 (diabetes  mellitus, type 2) (HCC) 01/13/2011  . HTN (hypertension), benign 01/13/2011  . Psoriatic arthritis (HCC) 01/13/2011  . Anemia 01/13/2011  . Thyroiditis, chronic 01/13/2011  . Morbid obesity (HCC) 01/13/2011  . Asthma 01/13/2011   Past Medical History:  Diagnosis Date  . Arthritis   . Asthma   . CFS (chronic fatigue syndrome)   . Compression fracture of vertebral column (HCC) 01/16/2011   per CXR  . DDD (degenerative disc disease), lumbar 01/18/2016  . Diabetes mellitus   . Elevated cholesterol 01/18/2016  . Fibromyalgia 01/18/2016  . Fibromyositis   . Hashimoto's thyroiditis   . HTN (hypertension), benign   . Hypoglycemia associated with diabetes (HCC) 01/16/2011  . Morbid obesity (HCC)   . Osteoarthritis of both knees 01/18/2016  . Pneumonia 12/2010  . Psoriasis 01/18/2016  . Psoriatic arthritis (HCC)   . Thyroid disease     Family History  Problem Relation Age of Onset  . Diabetes Father   . Hypertension Mother   . Stroke Mother   . Cancer Brother   . Cancer Brother     Past Surgical History:  Procedure Laterality Date  . ABDOMINAL HYSTERECTOMY    . ABDOMINAL SURGERY     lapband  . CATARACT EXTRACTION    . KNEE SURGERY     Social History   Occupational History  . Not on file.   Social History Main Topics  . Smoking status: Former Smoker    Years: 20.00    Types: Cigarettes  Quit date: 01/19/1991  . Smokeless tobacco: Never Used  . Alcohol use No  . Drug use: No  . Sexual activity: Not on file

## 2016-07-13 DIAGNOSIS — R0689 Other abnormalities of breathing: Secondary | ICD-10-CM | POA: Diagnosis not present

## 2016-07-13 DIAGNOSIS — G8929 Other chronic pain: Secondary | ICD-10-CM | POA: Diagnosis not present

## 2016-07-13 DIAGNOSIS — R69 Illness, unspecified: Secondary | ICD-10-CM | POA: Diagnosis not present

## 2016-07-13 DIAGNOSIS — E119 Type 2 diabetes mellitus without complications: Secondary | ICD-10-CM | POA: Diagnosis not present

## 2016-07-15 DIAGNOSIS — M545 Low back pain: Secondary | ICD-10-CM | POA: Diagnosis not present

## 2016-07-15 DIAGNOSIS — M797 Fibromyalgia: Secondary | ICD-10-CM | POA: Diagnosis not present

## 2016-07-15 DIAGNOSIS — S42201D Unspecified fracture of upper end of right humerus, subsequent encounter for fracture with routine healing: Secondary | ICD-10-CM | POA: Diagnosis not present

## 2016-07-18 DIAGNOSIS — I10 Essential (primary) hypertension: Secondary | ICD-10-CM | POA: Diagnosis not present

## 2016-07-18 DIAGNOSIS — R69 Illness, unspecified: Secondary | ICD-10-CM | POA: Diagnosis not present

## 2016-07-18 DIAGNOSIS — E119 Type 2 diabetes mellitus without complications: Secondary | ICD-10-CM | POA: Diagnosis not present

## 2016-07-18 DIAGNOSIS — R600 Localized edema: Secondary | ICD-10-CM | POA: Diagnosis not present

## 2016-07-18 DIAGNOSIS — F339 Major depressive disorder, recurrent, unspecified: Secondary | ICD-10-CM | POA: Diagnosis not present

## 2016-07-18 DIAGNOSIS — G8929 Other chronic pain: Secondary | ICD-10-CM | POA: Diagnosis not present

## 2016-07-18 DIAGNOSIS — G4700 Insomnia, unspecified: Secondary | ICD-10-CM | POA: Diagnosis not present

## 2016-07-20 DIAGNOSIS — R600 Localized edema: Secondary | ICD-10-CM | POA: Diagnosis not present

## 2016-07-20 DIAGNOSIS — E119 Type 2 diabetes mellitus without complications: Secondary | ICD-10-CM | POA: Diagnosis not present

## 2016-07-20 DIAGNOSIS — G8929 Other chronic pain: Secondary | ICD-10-CM | POA: Diagnosis not present

## 2016-07-20 DIAGNOSIS — E559 Vitamin D deficiency, unspecified: Secondary | ICD-10-CM | POA: Diagnosis not present

## 2016-07-20 DIAGNOSIS — I10 Essential (primary) hypertension: Secondary | ICD-10-CM | POA: Diagnosis not present

## 2016-08-01 DIAGNOSIS — R69 Illness, unspecified: Secondary | ICD-10-CM | POA: Diagnosis not present

## 2016-08-01 DIAGNOSIS — G8929 Other chronic pain: Secondary | ICD-10-CM | POA: Diagnosis not present

## 2016-08-01 DIAGNOSIS — L4059 Other psoriatic arthropathy: Secondary | ICD-10-CM | POA: Diagnosis not present

## 2016-08-01 DIAGNOSIS — E119 Type 2 diabetes mellitus without complications: Secondary | ICD-10-CM | POA: Diagnosis not present

## 2016-08-08 DIAGNOSIS — R600 Localized edema: Secondary | ICD-10-CM | POA: Diagnosis not present

## 2016-08-08 DIAGNOSIS — E119 Type 2 diabetes mellitus without complications: Secondary | ICD-10-CM | POA: Diagnosis not present

## 2016-08-08 DIAGNOSIS — R69 Illness, unspecified: Secondary | ICD-10-CM | POA: Diagnosis not present

## 2016-08-08 DIAGNOSIS — I10 Essential (primary) hypertension: Secondary | ICD-10-CM | POA: Diagnosis not present

## 2016-08-14 DIAGNOSIS — M797 Fibromyalgia: Secondary | ICD-10-CM | POA: Diagnosis not present

## 2016-08-14 DIAGNOSIS — S42201D Unspecified fracture of upper end of right humerus, subsequent encounter for fracture with routine healing: Secondary | ICD-10-CM | POA: Diagnosis not present

## 2016-08-14 DIAGNOSIS — M545 Low back pain: Secondary | ICD-10-CM | POA: Diagnosis not present

## 2016-08-29 DIAGNOSIS — G8929 Other chronic pain: Secondary | ICD-10-CM | POA: Diagnosis not present

## 2016-08-30 DIAGNOSIS — M797 Fibromyalgia: Secondary | ICD-10-CM | POA: Diagnosis not present

## 2016-08-30 DIAGNOSIS — R2681 Unsteadiness on feet: Secondary | ICD-10-CM | POA: Diagnosis not present

## 2016-08-30 DIAGNOSIS — A401 Sepsis due to streptococcus, group B: Secondary | ICD-10-CM | POA: Diagnosis not present

## 2016-08-30 DIAGNOSIS — R279 Unspecified lack of coordination: Secondary | ICD-10-CM | POA: Diagnosis not present

## 2016-08-30 DIAGNOSIS — E11649 Type 2 diabetes mellitus with hypoglycemia without coma: Secondary | ICD-10-CM | POA: Diagnosis not present

## 2016-08-30 DIAGNOSIS — R4182 Altered mental status, unspecified: Secondary | ICD-10-CM | POA: Diagnosis not present

## 2016-08-30 DIAGNOSIS — R69 Illness, unspecified: Secondary | ICD-10-CM | POA: Diagnosis not present

## 2016-08-30 DIAGNOSIS — M545 Low back pain: Secondary | ICD-10-CM | POA: Diagnosis not present

## 2016-08-30 DIAGNOSIS — R262 Difficulty in walking, not elsewhere classified: Secondary | ICD-10-CM | POA: Diagnosis not present

## 2016-08-30 DIAGNOSIS — M6281 Muscle weakness (generalized): Secondary | ICD-10-CM | POA: Diagnosis not present

## 2016-08-30 DIAGNOSIS — R41841 Cognitive communication deficit: Secondary | ICD-10-CM | POA: Diagnosis not present

## 2016-08-30 DIAGNOSIS — G8929 Other chronic pain: Secondary | ICD-10-CM | POA: Diagnosis not present

## 2016-08-30 DIAGNOSIS — M170 Bilateral primary osteoarthritis of knee: Secondary | ICD-10-CM | POA: Diagnosis not present

## 2016-08-31 DIAGNOSIS — I10 Essential (primary) hypertension: Secondary | ICD-10-CM | POA: Diagnosis not present

## 2016-08-31 DIAGNOSIS — E119 Type 2 diabetes mellitus without complications: Secondary | ICD-10-CM | POA: Diagnosis not present

## 2016-08-31 DIAGNOSIS — M797 Fibromyalgia: Secondary | ICD-10-CM | POA: Diagnosis not present

## 2016-08-31 DIAGNOSIS — R5381 Other malaise: Secondary | ICD-10-CM | POA: Diagnosis not present

## 2016-09-05 DIAGNOSIS — R2681 Unsteadiness on feet: Secondary | ICD-10-CM | POA: Diagnosis not present

## 2016-09-05 DIAGNOSIS — M6281 Muscle weakness (generalized): Secondary | ICD-10-CM | POA: Diagnosis not present

## 2016-09-05 DIAGNOSIS — M170 Bilateral primary osteoarthritis of knee: Secondary | ICD-10-CM | POA: Diagnosis not present

## 2016-09-05 DIAGNOSIS — E559 Vitamin D deficiency, unspecified: Secondary | ICD-10-CM | POA: Diagnosis not present

## 2016-09-05 DIAGNOSIS — A401 Sepsis due to streptococcus, group B: Secondary | ICD-10-CM | POA: Diagnosis not present

## 2016-09-06 DIAGNOSIS — M6281 Muscle weakness (generalized): Secondary | ICD-10-CM | POA: Diagnosis not present

## 2016-09-06 DIAGNOSIS — R2681 Unsteadiness on feet: Secondary | ICD-10-CM | POA: Diagnosis not present

## 2016-09-06 DIAGNOSIS — A401 Sepsis due to streptococcus, group B: Secondary | ICD-10-CM | POA: Diagnosis not present

## 2016-09-06 DIAGNOSIS — M170 Bilateral primary osteoarthritis of knee: Secondary | ICD-10-CM | POA: Diagnosis not present

## 2016-09-07 DIAGNOSIS — M170 Bilateral primary osteoarthritis of knee: Secondary | ICD-10-CM | POA: Diagnosis not present

## 2016-09-07 DIAGNOSIS — A401 Sepsis due to streptococcus, group B: Secondary | ICD-10-CM | POA: Diagnosis not present

## 2016-09-07 DIAGNOSIS — I878 Other specified disorders of veins: Secondary | ICD-10-CM | POA: Diagnosis not present

## 2016-09-07 DIAGNOSIS — R2681 Unsteadiness on feet: Secondary | ICD-10-CM | POA: Diagnosis not present

## 2016-09-07 DIAGNOSIS — M6281 Muscle weakness (generalized): Secondary | ICD-10-CM | POA: Diagnosis not present

## 2016-09-10 DIAGNOSIS — M6281 Muscle weakness (generalized): Secondary | ICD-10-CM | POA: Diagnosis not present

## 2016-09-10 DIAGNOSIS — M170 Bilateral primary osteoarthritis of knee: Secondary | ICD-10-CM | POA: Diagnosis not present

## 2016-09-10 DIAGNOSIS — A401 Sepsis due to streptococcus, group B: Secondary | ICD-10-CM | POA: Diagnosis not present

## 2016-09-10 DIAGNOSIS — R2681 Unsteadiness on feet: Secondary | ICD-10-CM | POA: Diagnosis not present

## 2016-09-11 DIAGNOSIS — R2681 Unsteadiness on feet: Secondary | ICD-10-CM | POA: Diagnosis not present

## 2016-09-11 DIAGNOSIS — M6281 Muscle weakness (generalized): Secondary | ICD-10-CM | POA: Diagnosis not present

## 2016-09-11 DIAGNOSIS — M170 Bilateral primary osteoarthritis of knee: Secondary | ICD-10-CM | POA: Diagnosis not present

## 2016-09-11 DIAGNOSIS — A401 Sepsis due to streptococcus, group B: Secondary | ICD-10-CM | POA: Diagnosis not present

## 2016-09-12 DIAGNOSIS — M170 Bilateral primary osteoarthritis of knee: Secondary | ICD-10-CM | POA: Diagnosis not present

## 2016-09-12 DIAGNOSIS — M6281 Muscle weakness (generalized): Secondary | ICD-10-CM | POA: Diagnosis not present

## 2016-09-12 DIAGNOSIS — R2681 Unsteadiness on feet: Secondary | ICD-10-CM | POA: Diagnosis not present

## 2016-09-12 DIAGNOSIS — A401 Sepsis due to streptococcus, group B: Secondary | ICD-10-CM | POA: Diagnosis not present

## 2016-09-13 DIAGNOSIS — M170 Bilateral primary osteoarthritis of knee: Secondary | ICD-10-CM | POA: Diagnosis not present

## 2016-09-13 DIAGNOSIS — M6281 Muscle weakness (generalized): Secondary | ICD-10-CM | POA: Diagnosis not present

## 2016-09-13 DIAGNOSIS — A401 Sepsis due to streptococcus, group B: Secondary | ICD-10-CM | POA: Diagnosis not present

## 2016-09-13 DIAGNOSIS — R2681 Unsteadiness on feet: Secondary | ICD-10-CM | POA: Diagnosis not present

## 2016-09-14 DIAGNOSIS — M545 Low back pain: Secondary | ICD-10-CM | POA: Diagnosis not present

## 2016-09-14 DIAGNOSIS — S42201D Unspecified fracture of upper end of right humerus, subsequent encounter for fracture with routine healing: Secondary | ICD-10-CM | POA: Diagnosis not present

## 2016-09-14 DIAGNOSIS — M170 Bilateral primary osteoarthritis of knee: Secondary | ICD-10-CM | POA: Diagnosis not present

## 2016-09-14 DIAGNOSIS — R2681 Unsteadiness on feet: Secondary | ICD-10-CM | POA: Diagnosis not present

## 2016-09-14 DIAGNOSIS — A401 Sepsis due to streptococcus, group B: Secondary | ICD-10-CM | POA: Diagnosis not present

## 2016-09-14 DIAGNOSIS — M6281 Muscle weakness (generalized): Secondary | ICD-10-CM | POA: Diagnosis not present

## 2016-09-14 DIAGNOSIS — M797 Fibromyalgia: Secondary | ICD-10-CM | POA: Diagnosis not present

## 2016-09-26 DIAGNOSIS — R319 Hematuria, unspecified: Secondary | ICD-10-CM | POA: Diagnosis not present

## 2016-09-26 DIAGNOSIS — N390 Urinary tract infection, site not specified: Secondary | ICD-10-CM | POA: Diagnosis not present

## 2016-10-01 ENCOUNTER — Telehealth: Payer: Self-pay | Admitting: Rheumatology

## 2016-10-01 NOTE — Telephone Encounter (Signed)
Attempted to contact the patient's daughter [name]. Left message for [name] to call the office.

## 2016-10-01 NOTE — Telephone Encounter (Signed)
Patient's daughter returned your call.  She stated that it is okay for you to leave a detailed message on her vm letting her know what mg of Magnesium she needs to get.  Thank you.

## 2016-10-01 NOTE — Telephone Encounter (Signed)
Patient's daughter left a message on Friday called in regards to her mother's magnesium supplements.  Thank you.

## 2016-10-03 ENCOUNTER — Ambulatory Visit (INDEPENDENT_AMBULATORY_CARE_PROVIDER_SITE_OTHER): Payer: Medicare HMO | Admitting: Orthopedic Surgery

## 2016-10-12 NOTE — Telephone Encounter (Signed)
Left message to advise magnesium malate starting with 250 mg and gradually increasing 1000 mg

## 2016-10-14 DIAGNOSIS — M797 Fibromyalgia: Secondary | ICD-10-CM | POA: Diagnosis not present

## 2016-10-14 DIAGNOSIS — M545 Low back pain: Secondary | ICD-10-CM | POA: Diagnosis not present

## 2016-10-14 DIAGNOSIS — S42201D Unspecified fracture of upper end of right humerus, subsequent encounter for fracture with routine healing: Secondary | ICD-10-CM | POA: Diagnosis not present

## 2016-10-17 DIAGNOSIS — E119 Type 2 diabetes mellitus without complications: Secondary | ICD-10-CM | POA: Diagnosis not present

## 2016-10-17 DIAGNOSIS — I872 Venous insufficiency (chronic) (peripheral): Secondary | ICD-10-CM | POA: Diagnosis not present

## 2016-10-26 DIAGNOSIS — H1132 Conjunctival hemorrhage, left eye: Secondary | ICD-10-CM | POA: Diagnosis not present

## 2016-11-09 DIAGNOSIS — R69 Illness, unspecified: Secondary | ICD-10-CM | POA: Diagnosis not present

## 2016-11-12 DIAGNOSIS — R0602 Shortness of breath: Secondary | ICD-10-CM | POA: Diagnosis not present

## 2016-11-12 DIAGNOSIS — I10 Essential (primary) hypertension: Secondary | ICD-10-CM | POA: Diagnosis not present

## 2016-11-12 DIAGNOSIS — D649 Anemia, unspecified: Secondary | ICD-10-CM | POA: Diagnosis not present

## 2016-11-14 DIAGNOSIS — M545 Low back pain: Secondary | ICD-10-CM | POA: Diagnosis not present

## 2016-11-14 DIAGNOSIS — S42201D Unspecified fracture of upper end of right humerus, subsequent encounter for fracture with routine healing: Secondary | ICD-10-CM | POA: Diagnosis not present

## 2016-11-14 DIAGNOSIS — M797 Fibromyalgia: Secondary | ICD-10-CM | POA: Diagnosis not present

## 2016-11-27 DIAGNOSIS — D649 Anemia, unspecified: Secondary | ICD-10-CM | POA: Diagnosis not present

## 2016-11-27 DIAGNOSIS — I10 Essential (primary) hypertension: Secondary | ICD-10-CM | POA: Diagnosis not present

## 2016-12-05 DIAGNOSIS — L03116 Cellulitis of left lower limb: Secondary | ICD-10-CM | POA: Diagnosis not present

## 2016-12-15 DIAGNOSIS — S42201D Unspecified fracture of upper end of right humerus, subsequent encounter for fracture with routine healing: Secondary | ICD-10-CM | POA: Diagnosis not present

## 2016-12-15 DIAGNOSIS — M797 Fibromyalgia: Secondary | ICD-10-CM | POA: Diagnosis not present

## 2016-12-15 DIAGNOSIS — M545 Low back pain: Secondary | ICD-10-CM | POA: Diagnosis not present

## 2016-12-24 DIAGNOSIS — R319 Hematuria, unspecified: Secondary | ICD-10-CM | POA: Diagnosis not present

## 2016-12-24 DIAGNOSIS — Z79899 Other long term (current) drug therapy: Secondary | ICD-10-CM | POA: Diagnosis not present

## 2016-12-24 DIAGNOSIS — N390 Urinary tract infection, site not specified: Secondary | ICD-10-CM | POA: Diagnosis not present

## 2016-12-26 DIAGNOSIS — R69 Illness, unspecified: Secondary | ICD-10-CM | POA: Diagnosis not present

## 2016-12-27 DIAGNOSIS — M6281 Muscle weakness (generalized): Secondary | ICD-10-CM | POA: Diagnosis not present

## 2016-12-27 DIAGNOSIS — N390 Urinary tract infection, site not specified: Secondary | ICD-10-CM | POA: Diagnosis not present

## 2016-12-27 DIAGNOSIS — R05 Cough: Secondary | ICD-10-CM | POA: Diagnosis not present

## 2016-12-27 DIAGNOSIS — J45909 Unspecified asthma, uncomplicated: Secondary | ICD-10-CM | POA: Diagnosis not present

## 2016-12-27 DIAGNOSIS — G8929 Other chronic pain: Secondary | ICD-10-CM | POA: Diagnosis not present

## 2016-12-27 DIAGNOSIS — E119 Type 2 diabetes mellitus without complications: Secondary | ICD-10-CM | POA: Diagnosis not present

## 2016-12-27 DIAGNOSIS — E031 Congenital hypothyroidism without goiter: Secondary | ICD-10-CM | POA: Diagnosis not present

## 2016-12-27 DIAGNOSIS — R269 Unspecified abnormalities of gait and mobility: Secondary | ICD-10-CM | POA: Diagnosis not present

## 2016-12-27 DIAGNOSIS — I10 Essential (primary) hypertension: Secondary | ICD-10-CM | POA: Diagnosis not present

## 2017-01-03 DIAGNOSIS — R69 Illness, unspecified: Secondary | ICD-10-CM | POA: Diagnosis not present

## 2017-01-08 DIAGNOSIS — R69 Illness, unspecified: Secondary | ICD-10-CM | POA: Diagnosis not present

## 2017-01-14 DIAGNOSIS — M797 Fibromyalgia: Secondary | ICD-10-CM | POA: Diagnosis not present

## 2017-01-14 DIAGNOSIS — S42201D Unspecified fracture of upper end of right humerus, subsequent encounter for fracture with routine healing: Secondary | ICD-10-CM | POA: Diagnosis not present

## 2017-01-14 DIAGNOSIS — G4700 Insomnia, unspecified: Secondary | ICD-10-CM | POA: Diagnosis not present

## 2017-01-14 DIAGNOSIS — F331 Major depressive disorder, recurrent, moderate: Secondary | ICD-10-CM | POA: Diagnosis not present

## 2017-01-14 DIAGNOSIS — M545 Low back pain: Secondary | ICD-10-CM | POA: Diagnosis not present

## 2017-01-15 DIAGNOSIS — R69 Illness, unspecified: Secondary | ICD-10-CM | POA: Diagnosis not present

## 2017-01-17 DIAGNOSIS — E038 Other specified hypothyroidism: Secondary | ICD-10-CM | POA: Diagnosis not present

## 2017-01-17 DIAGNOSIS — E119 Type 2 diabetes mellitus without complications: Secondary | ICD-10-CM | POA: Diagnosis not present

## 2017-01-17 DIAGNOSIS — J45909 Unspecified asthma, uncomplicated: Secondary | ICD-10-CM | POA: Diagnosis not present

## 2017-01-17 DIAGNOSIS — R269 Unspecified abnormalities of gait and mobility: Secondary | ICD-10-CM | POA: Diagnosis not present

## 2017-01-17 DIAGNOSIS — E031 Congenital hypothyroidism without goiter: Secondary | ICD-10-CM | POA: Diagnosis not present

## 2017-01-17 DIAGNOSIS — G8929 Other chronic pain: Secondary | ICD-10-CM | POA: Diagnosis not present

## 2017-01-17 DIAGNOSIS — R05 Cough: Secondary | ICD-10-CM | POA: Diagnosis not present

## 2017-01-17 DIAGNOSIS — M6281 Muscle weakness (generalized): Secondary | ICD-10-CM | POA: Diagnosis not present

## 2017-01-17 DIAGNOSIS — B379 Candidiasis, unspecified: Secondary | ICD-10-CM | POA: Diagnosis not present

## 2017-01-17 DIAGNOSIS — I10 Essential (primary) hypertension: Secondary | ICD-10-CM | POA: Diagnosis not present

## 2017-01-17 DIAGNOSIS — E039 Hypothyroidism, unspecified: Secondary | ICD-10-CM | POA: Diagnosis not present

## 2017-01-17 DIAGNOSIS — N390 Urinary tract infection, site not specified: Secondary | ICD-10-CM | POA: Diagnosis not present

## 2017-01-17 DIAGNOSIS — R69 Illness, unspecified: Secondary | ICD-10-CM | POA: Diagnosis not present

## 2017-01-22 DIAGNOSIS — R69 Illness, unspecified: Secondary | ICD-10-CM | POA: Diagnosis not present

## 2017-01-23 DIAGNOSIS — G894 Chronic pain syndrome: Secondary | ICD-10-CM | POA: Diagnosis not present

## 2017-01-23 DIAGNOSIS — M1990 Unspecified osteoarthritis, unspecified site: Secondary | ICD-10-CM | POA: Diagnosis not present

## 2017-01-28 DIAGNOSIS — R2681 Unsteadiness on feet: Secondary | ICD-10-CM | POA: Diagnosis not present

## 2017-01-28 DIAGNOSIS — M170 Bilateral primary osteoarthritis of knee: Secondary | ICD-10-CM | POA: Diagnosis not present

## 2017-01-28 DIAGNOSIS — E11649 Type 2 diabetes mellitus with hypoglycemia without coma: Secondary | ICD-10-CM | POA: Diagnosis not present

## 2017-01-28 DIAGNOSIS — M6281 Muscle weakness (generalized): Secondary | ICD-10-CM | POA: Diagnosis not present

## 2017-01-28 DIAGNOSIS — R69 Illness, unspecified: Secondary | ICD-10-CM | POA: Diagnosis not present

## 2017-01-28 DIAGNOSIS — R41841 Cognitive communication deficit: Secondary | ICD-10-CM | POA: Diagnosis not present

## 2017-01-28 DIAGNOSIS — M797 Fibromyalgia: Secondary | ICD-10-CM | POA: Diagnosis not present

## 2017-01-28 DIAGNOSIS — A401 Sepsis due to streptococcus, group B: Secondary | ICD-10-CM | POA: Diagnosis not present

## 2017-01-28 DIAGNOSIS — R4182 Altered mental status, unspecified: Secondary | ICD-10-CM | POA: Diagnosis not present

## 2017-01-29 DIAGNOSIS — R69 Illness, unspecified: Secondary | ICD-10-CM | POA: Diagnosis not present

## 2017-02-12 DIAGNOSIS — G894 Chronic pain syndrome: Secondary | ICD-10-CM | POA: Diagnosis not present

## 2017-02-12 DIAGNOSIS — R69 Illness, unspecified: Secondary | ICD-10-CM | POA: Diagnosis not present

## 2017-02-12 DIAGNOSIS — G9009 Other idiopathic peripheral autonomic neuropathy: Secondary | ICD-10-CM | POA: Diagnosis not present

## 2017-02-12 DIAGNOSIS — L84 Corns and callosities: Secondary | ICD-10-CM | POA: Diagnosis not present

## 2017-02-12 DIAGNOSIS — L603 Nail dystrophy: Secondary | ICD-10-CM | POA: Diagnosis not present

## 2017-02-12 DIAGNOSIS — R600 Localized edema: Secondary | ICD-10-CM | POA: Diagnosis not present

## 2017-02-12 DIAGNOSIS — B351 Tinea unguium: Secondary | ICD-10-CM | POA: Diagnosis not present

## 2017-02-12 DIAGNOSIS — E119 Type 2 diabetes mellitus without complications: Secondary | ICD-10-CM | POA: Diagnosis not present

## 2017-02-12 DIAGNOSIS — Q845 Enlarged and hypertrophic nails: Secondary | ICD-10-CM | POA: Diagnosis not present

## 2017-02-12 DIAGNOSIS — I739 Peripheral vascular disease, unspecified: Secondary | ICD-10-CM | POA: Diagnosis not present

## 2017-02-12 DIAGNOSIS — L853 Xerosis cutis: Secondary | ICD-10-CM | POA: Diagnosis not present

## 2017-02-14 DIAGNOSIS — S42201D Unspecified fracture of upper end of right humerus, subsequent encounter for fracture with routine healing: Secondary | ICD-10-CM | POA: Diagnosis not present

## 2017-02-14 DIAGNOSIS — G4700 Insomnia, unspecified: Secondary | ICD-10-CM | POA: Diagnosis not present

## 2017-02-14 DIAGNOSIS — M545 Low back pain: Secondary | ICD-10-CM | POA: Diagnosis not present

## 2017-02-14 DIAGNOSIS — M797 Fibromyalgia: Secondary | ICD-10-CM | POA: Diagnosis not present

## 2017-02-14 DIAGNOSIS — G894 Chronic pain syndrome: Secondary | ICD-10-CM | POA: Diagnosis not present

## 2017-02-18 DIAGNOSIS — L299 Pruritus, unspecified: Secondary | ICD-10-CM | POA: Diagnosis not present

## 2017-02-18 DIAGNOSIS — R21 Rash and other nonspecific skin eruption: Secondary | ICD-10-CM | POA: Diagnosis not present

## 2017-02-18 DIAGNOSIS — R69 Illness, unspecified: Secondary | ICD-10-CM | POA: Diagnosis not present

## 2017-02-20 ENCOUNTER — Telehealth: Payer: Self-pay | Admitting: Rheumatology

## 2017-02-20 DIAGNOSIS — I10 Essential (primary) hypertension: Secondary | ICD-10-CM | POA: Diagnosis not present

## 2017-02-20 DIAGNOSIS — L409 Psoriasis, unspecified: Secondary | ICD-10-CM | POA: Diagnosis not present

## 2017-02-20 DIAGNOSIS — R269 Unspecified abnormalities of gait and mobility: Secondary | ICD-10-CM | POA: Diagnosis not present

## 2017-02-20 DIAGNOSIS — E119 Type 2 diabetes mellitus without complications: Secondary | ICD-10-CM | POA: Diagnosis not present

## 2017-02-20 DIAGNOSIS — G8929 Other chronic pain: Secondary | ICD-10-CM | POA: Diagnosis not present

## 2017-02-20 DIAGNOSIS — E031 Congenital hypothyroidism without goiter: Secondary | ICD-10-CM | POA: Diagnosis not present

## 2017-02-20 DIAGNOSIS — M6281 Muscle weakness (generalized): Secondary | ICD-10-CM | POA: Diagnosis not present

## 2017-02-20 DIAGNOSIS — N390 Urinary tract infection, site not specified: Secondary | ICD-10-CM | POA: Diagnosis not present

## 2017-02-20 DIAGNOSIS — J45909 Unspecified asthma, uncomplicated: Secondary | ICD-10-CM | POA: Diagnosis not present

## 2017-02-20 DIAGNOSIS — D649 Anemia, unspecified: Secondary | ICD-10-CM | POA: Diagnosis not present

## 2017-02-20 NOTE — Telephone Encounter (Signed)
Opened in error

## 2017-02-20 NOTE — Progress Notes (Signed)
Office Visit Note  Patient: Ashley Caldwell             Date of Birth: 01/17/1941           MRN: [id number]             PCP: Benita Stabile, MD Referring: Benita Stabile, MD Visit Date: 02/22/2017 Occupation: @GUAROCC @    Subjective:  Other (rash )   History of Present Illness: Ashley Caldwell is a 76 y.o. female with history of psoriatic arthritis, osteoarthritis, and fibromyalgia.  Patient states she continues to have significant knee pain.  She states she can not tell if she is having edema or knee swelling.  She states she has lost about 100 lbs and would like to reconsider knee replacements in the future.  She uses a Fentanyl patch for pain as well as taking Oxycodone when severe.  She has a rash on bilateral legs.  She feels the rash on her upper thighs is different than the rash on her lower legs.  She denies any itching.  She continues to have generalized pain and insomnia associated with her fibromyalgia. She reports she takes her methotrexate weekly.    Activities of Daily Living:  Patient reports morning stiffness for  all day.   Patient Reports nocturnal pain.  Difficulty dressing/grooming: Reports Difficulty climbing stairs: Reports Difficulty getting out of chair: Reports Difficulty using hands for taps, buttons, cutlery, and/or writing: Denies   Review of Systems  Constitutional: Negative for fatigue and weakness.  HENT: Positive for mouth dryness. Negative for mouth sores and nose dryness.   Eyes: Positive for dryness. Negative for redness.  Respiratory: Negative for cough, hemoptysis, shortness of breath and difficulty breathing.   Cardiovascular: Negative.  Negative for chest pain, palpitations, hypertension, irregular heartbeat and swelling in legs/feet.  Gastrointestinal: Negative.  Negative for blood in stool, constipation and diarrhea.  Endocrine: Positive for increased urination.  Genitourinary: Negative for painful urination.  Musculoskeletal: Positive for  arthralgias, joint pain, joint swelling, muscle weakness, morning stiffness and muscle tenderness. Negative for myalgias and myalgias.  Skin: Positive for rash. Negative for pallor, hair loss, nodules/bumps, redness, skin tightness, ulcers and sensitivity to sunlight.  Neurological: Negative for dizziness, numbness and headaches.  Psychiatric/Behavioral: Positive for sleep disturbance. Negative for depressed mood. The patient is not nervous/anxious.     PMFS History:  Patient Active Problem List   Diagnosis Date Noted  . High risk medication use 06/12/2016  . Closed fracture of right proximal humerus 05/01/2016  . History of renal failure 04/26/2016  . Primary osteoarthritis of both knees 04/26/2016  . Renal failure 04/13/2016  . Acute renal failure (HCC) 04/13/2016  . Psoriasis 01/18/2016  . Fibromyalgia 01/18/2016  . DDD (degenerative disc disease), lumbar 01/18/2016  . Osteoarthritis of both knees 01/18/2016  . Elevated cholesterol 01/18/2016  . Hypoglycemia associated with diabetes (HCC) 01/16/2011  . Compression fracture of vertebral column (HCC) 01/16/2011  . Hyponatremia 01/14/2011  . Pneumonia 01/13/2011  . Hyperglycemia 01/13/2011  . Weakness generalized 01/13/2011  . DM type 2 (diabetes mellitus, type 2) (HCC) 01/13/2011  . HTN (hypertension), benign 01/13/2011  . Psoriatic arthritis (HCC) 01/13/2011  . Anemia 01/13/2011  . Thyroiditis, chronic 01/13/2011  . Morbid obesity (HCC) 01/13/2011  . Asthma 01/13/2011    Past Medical History:  Diagnosis Date  . Arthritis   . Asthma   . CFS (chronic fatigue syndrome)   . Compression fracture of vertebral column (HCC) 01/16/2011   per  CXR  . DDD (degenerative disc disease), lumbar 01/18/2016  . Diabetes mellitus   . Elevated cholesterol 01/18/2016  . Fibromyalgia 01/18/2016  . Fibromyositis   . Hashimoto's thyroiditis   . HTN (hypertension), benign   . Hypoglycemia associated with diabetes (HCC) 01/16/2011  . Morbid  obesity (HCC)   . Osteoarthritis of both knees 01/18/2016  . Pneumonia 12/2010  . Psoriasis 01/18/2016  . Psoriatic arthritis (HCC)   . Thyroid disease     Family History  Problem Relation Age of Onset  . Diabetes Father   . Hypertension Mother   . Stroke Mother   . Cancer Brother   . Cancer Brother    Past Surgical History:  Procedure Laterality Date  . ABDOMINAL HYSTERECTOMY    . ABDOMINAL SURGERY     lapband  . CATARACT EXTRACTION    . KNEE SURGERY     Social History   Social History Narrative  . Not on file     Objective: Vital Signs: BP (!) 147/66 (BP Location: Left Arm, Patient Position: Sitting, Cuff Size: Normal)   Pulse 80   Resp 16   Ht 5\' 10"  (1.778 m)   BMI 47.35 kg/m    Physical Exam  Constitutional: She is oriented to person, place, and time. She appears well-developed and well-nourished.  HENT:  Head: Normocephalic and atraumatic.  Eyes: Conjunctivae and EOM are normal.  Neck: Normal range of motion.  Cardiovascular: Normal rate, regular rhythm, normal heart sounds and intact distal pulses.  Pulmonary/Chest: Effort normal and breath sounds normal.  Abdominal: Soft. Bowel sounds are normal.  Musculoskeletal: She exhibits edema.  Lymphadenopathy:    She has no cervical adenopathy.  Neurological: She is alert and oriented to person, place, and time.  Skin: Skin is warm and dry. Capillary refill takes less than 2 seconds.  Pitting edema on bilateral lower extremities  Erythematous papular rash on bilateral lower extremities.   Psychiatric: She has a normal mood and affect. Her behavior is normal.  Nursing note and vitals reviewed.    Musculoskeletal Exam: Patient in wheelchair during exam. C-spine good ROM.  Thoracic kyphosis.  Right shoulder forward flexion 110. Left shoulder forward flexion is 120.  Elbow joint and wrist joints good ROM.  MCPs, PIPs, and DIPs good ROM with no synovitis. Knee joints limited ROM with discomfort.  Right knee slightly  warm to touch.  Tenderness to palpation of bilateral heels.  Limited ROM of ankles.  Pitting edema present bilaterally with overlying rash.     CDAI Exam: CDAI Homunculus Exam:   Joint Counts:  CDAI Tender Joint count: 0 CDAI Swollen Joint count: 0  Global Assessments:  Patient Global Assessment: 4   CDAI Calculated Score: 4    Investigation: No additional findings.   Imaging: No results found.  Speciality Comments: No specialty comments available.    Procedures:  No procedures performed Allergies: Amoxicillin-pot clavulanate; Lyrica [pregabalin]; and Latex   Assessment / Plan:     Visit Diagnoses: Psoriatic arthritis [facility]): Patient has no synovitis on exam.  Patient states she has been taking MTX at her nursing home, but the last refill was sent on 03/07/16. Discussed with patient that she will not need a refill at this time because it appears her psoriatic arthritis is well controlled.    High risk medication use - previously on MTX (2017) - Plan: CBC with Differential/Platelet, COMPLETE METABOLIC PANEL WITH GFR  Psoriasis: No active psoriasis present today on exam.   Plantar fasciitis: Proper  fitting shoes were discussed.    Pedal edema: Edema is significant.  She is on Lasix 20 mg.    Primary osteoarthritis of both knees: Patient continues to have chronic pain.  She remains in her wheelchair primarily.  Patient is on chronic pain medications.    Fibromyalgia: Generalized hyperalgesia on exam today.  Patient continues to have insomnia and takes Ambien 10 mg nightly, but she does not feel as though the Ambien works anymore due to such long term use.  Instructed patient to discuss other sleep aid options with her PCP.   Rash and other nonspecific skin eruption: Patient has bilateral pitting edema and erythematous papular rash on bilateral lower extremities suspicious for impetigo.  Encouraged patient to see a dermatologist and PCP about this rash.     History of  obesity  Other medical conditions are listed as follows:   History of hypertension  History of hyperlipidemia  History of vertebral fracture - 05/23/2016 L3. At this point she does not appear to be taking any medications. She should follow up with PCP to discuss possible treatment options.    History of asthma  Thyroiditis    Orders: Orders Placed This Encounter  Procedures  . CBC with Differential/Platelet  . COMPLETE METABOLIC PANEL WITH GFR   No orders of the defined types were placed in this encounter.   Face-to-face time spent with patient was 30 minutes. Greater than 50% of time was spent in counseling and coordination of care.  Follow-Up Instructions: Return for Psoriatic arthritis, Osteoarthritis, Fibromyalgia.  Pollyann Savoy, MD  Note - This record has been created using Animal nutritionist.  Chart creation errors have been sought, but may not always  have been located. Such creation errors do not reflect on  the standard of medical care.

## 2017-02-21 DIAGNOSIS — F411 Generalized anxiety disorder: Secondary | ICD-10-CM | POA: Diagnosis not present

## 2017-02-21 DIAGNOSIS — R69 Illness, unspecified: Secondary | ICD-10-CM | POA: Diagnosis not present

## 2017-02-22 ENCOUNTER — Ambulatory Visit: Payer: Medicare HMO | Admitting: Rheumatology

## 2017-02-22 ENCOUNTER — Telehealth: Payer: Self-pay | Admitting: Rheumatology

## 2017-02-22 ENCOUNTER — Encounter: Payer: Self-pay | Admitting: Rheumatology

## 2017-02-22 VITALS — BP 147/66 | HR 80 | Resp 16 | Ht 70.0 in

## 2017-02-22 DIAGNOSIS — E069 Thyroiditis, unspecified: Secondary | ICD-10-CM

## 2017-02-22 DIAGNOSIS — L4050 Arthropathic psoriasis, unspecified: Secondary | ICD-10-CM

## 2017-02-22 DIAGNOSIS — Z8709 Personal history of other diseases of the respiratory system: Secondary | ICD-10-CM | POA: Diagnosis not present

## 2017-02-22 DIAGNOSIS — M797 Fibromyalgia: Secondary | ICD-10-CM

## 2017-02-22 DIAGNOSIS — Z8781 Personal history of (healed) traumatic fracture: Secondary | ICD-10-CM

## 2017-02-22 DIAGNOSIS — R600 Localized edema: Secondary | ICD-10-CM | POA: Diagnosis not present

## 2017-02-22 DIAGNOSIS — L409 Psoriasis, unspecified: Secondary | ICD-10-CM

## 2017-02-22 DIAGNOSIS — M170 Bilateral primary osteoarthritis of knee: Secondary | ICD-10-CM

## 2017-02-22 DIAGNOSIS — Z8639 Personal history of other endocrine, nutritional and metabolic disease: Secondary | ICD-10-CM

## 2017-02-22 DIAGNOSIS — Z79899 Other long term (current) drug therapy: Secondary | ICD-10-CM | POA: Diagnosis not present

## 2017-02-22 DIAGNOSIS — Z8679 Personal history of other diseases of the circulatory system: Secondary | ICD-10-CM

## 2017-02-22 DIAGNOSIS — M722 Plantar fascial fibromatosis: Secondary | ICD-10-CM

## 2017-02-22 DIAGNOSIS — R21 Rash and other nonspecific skin eruption: Secondary | ICD-10-CM

## 2017-02-22 LAB — COMPLETE METABOLIC PANEL WITH GFR
AG Ratio: 1.3 (calc) (ref 1.0–2.5)
ALBUMIN MSPROF: 3.4 g/dL — AB (ref 3.6–5.1)
ALKALINE PHOSPHATASE (APISO): 100 U/L (ref 33–130)
ALT: 12 U/L (ref 6–29)
AST: 16 U/L (ref 10–35)
BILIRUBIN TOTAL: 0.5 mg/dL (ref 0.2–1.2)
BUN: 17 mg/dL (ref 7–25)
CHLORIDE: 100 mmol/L (ref 98–110)
CO2: 36 mmol/L — AB (ref 20–32)
CREATININE: 0.81 mg/dL (ref 0.60–0.93)
Calcium: 9.4 mg/dL (ref 8.6–10.4)
GFR, Est African American: 83 mL/min/{1.73_m2} (ref >=60)
GFR, Est Non African American: 72 mL/min/{1.73_m2} (ref >=60)
GLUCOSE: 82 mg/dL (ref 65–99)
Globulin: 2.7 g/dL (calc) (ref 1.9–3.7)
Potassium: 4.2 mmol/L (ref 3.5–5.3)
Sodium: 141 mmol/L (ref 135–146)
Total Protein: 6.1 g/dL (ref 6.1–8.1)

## 2017-02-22 LAB — CBC WITH DIFFERENTIAL/PLATELET
BASOS ABS: 59 {cells}/uL (ref 0–200)
BASOS PCT: 0.8 %
EOS PCT: 6.5 %
Eosinophils Absolute: 481 cells/uL (ref 15–500)
HCT: 36.8 % (ref 35.0–45.0)
HEMOGLOBIN: 12.2 g/dL (ref 11.7–15.5)
Lymphs Abs: 1561 cells/uL (ref 850–3900)
MCH: 29.8 pg (ref 27.0–33.0)
MCHC: 33.2 g/dL (ref 32.0–36.0)
MCV: 89.8 fL (ref 80.0–100.0)
MONOS PCT: 9.3 %
MPV: 9.5 fL (ref 7.5–12.5)
NEUTROS ABS: 4610 {cells}/uL (ref 1500–7800)
Neutrophils Relative %: 62.3 %
PLATELETS: 282 10*3/uL (ref 140–400)
RBC: 4.1 10*6/uL (ref 3.80–5.10)
RDW: 14.4 % (ref 11.0–15.0)
TOTAL LYMPHOCYTE: 21.1 %
WBC mixed population: 688 cells/uL (ref 200–950)
WBC: 7.4 10*3/uL (ref 3.8–10.8)

## 2017-02-22 NOTE — Telephone Encounter (Signed)
Patient advised she should get the referral from her PCP. Patient verbalized understanding.

## 2017-02-22 NOTE — Telephone Encounter (Signed)
Patient wanted to make sure that a referral was going to be sent to a Dermatologist.  [phone].  Thank you.

## 2017-02-25 NOTE — Progress Notes (Signed)
CBC and CMP are stable.

## 2017-02-26 DIAGNOSIS — R319 Hematuria, unspecified: Secondary | ICD-10-CM | POA: Diagnosis not present

## 2017-02-26 DIAGNOSIS — B354 Tinea corporis: Secondary | ICD-10-CM | POA: Diagnosis not present

## 2017-02-26 DIAGNOSIS — R21 Rash and other nonspecific skin eruption: Secondary | ICD-10-CM | POA: Diagnosis not present

## 2017-02-27 DIAGNOSIS — R69 Illness, unspecified: Secondary | ICD-10-CM | POA: Diagnosis not present

## 2017-02-28 DIAGNOSIS — N390 Urinary tract infection, site not specified: Secondary | ICD-10-CM | POA: Diagnosis not present

## 2017-02-28 DIAGNOSIS — Z79899 Other long term (current) drug therapy: Secondary | ICD-10-CM | POA: Diagnosis not present

## 2017-02-28 DIAGNOSIS — F339 Major depressive disorder, recurrent, unspecified: Secondary | ICD-10-CM | POA: Diagnosis not present

## 2017-02-28 DIAGNOSIS — R69 Illness, unspecified: Secondary | ICD-10-CM | POA: Diagnosis not present

## 2017-02-28 DIAGNOSIS — R319 Hematuria, unspecified: Secondary | ICD-10-CM | POA: Diagnosis not present

## 2017-03-01 DIAGNOSIS — L304 Erythema intertrigo: Secondary | ICD-10-CM | POA: Diagnosis not present

## 2017-03-01 DIAGNOSIS — I872 Venous insufficiency (chronic) (peripheral): Secondary | ICD-10-CM | POA: Diagnosis not present

## 2017-03-01 DIAGNOSIS — B86 Scabies: Secondary | ICD-10-CM | POA: Diagnosis not present

## 2017-03-01 DIAGNOSIS — I890 Lymphedema, not elsewhere classified: Secondary | ICD-10-CM | POA: Diagnosis not present

## 2017-03-04 DIAGNOSIS — B86 Scabies: Secondary | ICD-10-CM | POA: Diagnosis not present

## 2017-03-04 DIAGNOSIS — R300 Dysuria: Secondary | ICD-10-CM | POA: Diagnosis not present

## 2017-03-04 DIAGNOSIS — R21 Rash and other nonspecific skin eruption: Secondary | ICD-10-CM | POA: Diagnosis not present

## 2017-03-06 DIAGNOSIS — R69 Illness, unspecified: Secondary | ICD-10-CM | POA: Diagnosis not present

## 2017-03-07 DIAGNOSIS — F339 Major depressive disorder, recurrent, unspecified: Secondary | ICD-10-CM | POA: Diagnosis not present

## 2017-03-07 DIAGNOSIS — R69 Illness, unspecified: Secondary | ICD-10-CM | POA: Diagnosis not present

## 2017-03-14 DIAGNOSIS — R300 Dysuria: Secondary | ICD-10-CM | POA: Diagnosis not present

## 2017-03-14 DIAGNOSIS — F339 Major depressive disorder, recurrent, unspecified: Secondary | ICD-10-CM | POA: Diagnosis not present

## 2017-03-14 DIAGNOSIS — R69 Illness, unspecified: Secondary | ICD-10-CM | POA: Diagnosis not present

## 2017-03-15 DIAGNOSIS — R319 Hematuria, unspecified: Secondary | ICD-10-CM | POA: Diagnosis not present

## 2017-03-15 DIAGNOSIS — R300 Dysuria: Secondary | ICD-10-CM | POA: Diagnosis not present

## 2017-03-15 DIAGNOSIS — Z79899 Other long term (current) drug therapy: Secondary | ICD-10-CM | POA: Diagnosis not present

## 2017-03-15 DIAGNOSIS — N390 Urinary tract infection, site not specified: Secondary | ICD-10-CM | POA: Diagnosis not present

## 2017-03-16 DIAGNOSIS — M797 Fibromyalgia: Secondary | ICD-10-CM | POA: Diagnosis not present

## 2017-03-16 DIAGNOSIS — M545 Low back pain: Secondary | ICD-10-CM | POA: Diagnosis not present

## 2017-03-16 DIAGNOSIS — S42201D Unspecified fracture of upper end of right humerus, subsequent encounter for fracture with routine healing: Secondary | ICD-10-CM | POA: Diagnosis not present

## 2017-03-18 DIAGNOSIS — N390 Urinary tract infection, site not specified: Secondary | ICD-10-CM | POA: Diagnosis not present

## 2017-03-18 DIAGNOSIS — R300 Dysuria: Secondary | ICD-10-CM | POA: Diagnosis not present

## 2017-03-20 DIAGNOSIS — G4700 Insomnia, unspecified: Secondary | ICD-10-CM | POA: Diagnosis not present

## 2017-03-20 DIAGNOSIS — R69 Illness, unspecified: Secondary | ICD-10-CM | POA: Diagnosis not present

## 2017-03-21 DIAGNOSIS — R69 Illness, unspecified: Secondary | ICD-10-CM | POA: Diagnosis not present

## 2017-03-21 DIAGNOSIS — F339 Major depressive disorder, recurrent, unspecified: Secondary | ICD-10-CM | POA: Diagnosis not present

## 2017-03-26 DIAGNOSIS — R69 Illness, unspecified: Secondary | ICD-10-CM | POA: Diagnosis not present

## 2017-03-27 ENCOUNTER — Telehealth (INDEPENDENT_AMBULATORY_CARE_PROVIDER_SITE_OTHER): Payer: Self-pay | Admitting: Radiology

## 2017-03-27 DIAGNOSIS — E119 Type 2 diabetes mellitus without complications: Secondary | ICD-10-CM | POA: Diagnosis not present

## 2017-03-27 DIAGNOSIS — R21 Rash and other nonspecific skin eruption: Secondary | ICD-10-CM | POA: Diagnosis not present

## 2017-03-27 DIAGNOSIS — M797 Fibromyalgia: Secondary | ICD-10-CM | POA: Diagnosis not present

## 2017-03-27 DIAGNOSIS — N390 Urinary tract infection, site not specified: Secondary | ICD-10-CM | POA: Diagnosis not present

## 2017-03-27 DIAGNOSIS — I10 Essential (primary) hypertension: Secondary | ICD-10-CM | POA: Diagnosis not present

## 2017-03-27 DIAGNOSIS — D649 Anemia, unspecified: Secondary | ICD-10-CM | POA: Diagnosis not present

## 2017-03-27 DIAGNOSIS — M6281 Muscle weakness (generalized): Secondary | ICD-10-CM | POA: Diagnosis not present

## 2017-03-27 DIAGNOSIS — L409 Psoriasis, unspecified: Secondary | ICD-10-CM | POA: Diagnosis not present

## 2017-03-27 DIAGNOSIS — E031 Congenital hypothyroidism without goiter: Secondary | ICD-10-CM | POA: Diagnosis not present

## 2017-03-27 DIAGNOSIS — G8929 Other chronic pain: Secondary | ICD-10-CM | POA: Diagnosis not present

## 2017-03-27 NOTE — Telephone Encounter (Signed)
Cordelia Pen called for Dr Debby Bud, patient's PCP at [facility] to request most recent labs.  Labwork from December per [facility] faxed to him.

## 2017-04-02 DIAGNOSIS — R69 Illness, unspecified: Secondary | ICD-10-CM | POA: Diagnosis not present

## 2017-04-03 DIAGNOSIS — G894 Chronic pain syndrome: Secondary | ICD-10-CM | POA: Diagnosis not present

## 2017-04-03 DIAGNOSIS — M1991 Primary osteoarthritis, unspecified site: Secondary | ICD-10-CM | POA: Diagnosis not present

## 2017-04-03 DIAGNOSIS — M797 Fibromyalgia: Secondary | ICD-10-CM | POA: Diagnosis not present

## 2017-04-04 DIAGNOSIS — R69 Illness, unspecified: Secondary | ICD-10-CM | POA: Diagnosis not present

## 2017-04-04 DIAGNOSIS — F339 Major depressive disorder, recurrent, unspecified: Secondary | ICD-10-CM | POA: Diagnosis not present

## 2017-04-09 DIAGNOSIS — R69 Illness, unspecified: Secondary | ICD-10-CM | POA: Diagnosis not present

## 2017-04-11 DIAGNOSIS — I872 Venous insufficiency (chronic) (peripheral): Secondary | ICD-10-CM | POA: Diagnosis not present

## 2017-04-11 DIAGNOSIS — L304 Erythema intertrigo: Secondary | ICD-10-CM | POA: Diagnosis not present

## 2017-04-16 DIAGNOSIS — R69 Illness, unspecified: Secondary | ICD-10-CM | POA: Diagnosis not present

## 2017-04-16 DIAGNOSIS — M545 Low back pain: Secondary | ICD-10-CM | POA: Diagnosis not present

## 2017-04-16 DIAGNOSIS — S42201D Unspecified fracture of upper end of right humerus, subsequent encounter for fracture with routine healing: Secondary | ICD-10-CM | POA: Diagnosis not present

## 2017-04-16 DIAGNOSIS — M797 Fibromyalgia: Secondary | ICD-10-CM | POA: Diagnosis not present

## 2017-04-17 DIAGNOSIS — G4700 Insomnia, unspecified: Secondary | ICD-10-CM | POA: Diagnosis not present

## 2017-04-17 DIAGNOSIS — R69 Illness, unspecified: Secondary | ICD-10-CM | POA: Diagnosis not present

## 2017-04-23 DIAGNOSIS — M797 Fibromyalgia: Secondary | ICD-10-CM | POA: Diagnosis not present

## 2017-04-23 DIAGNOSIS — G8929 Other chronic pain: Secondary | ICD-10-CM | POA: Diagnosis not present

## 2017-04-23 DIAGNOSIS — F339 Major depressive disorder, recurrent, unspecified: Secondary | ICD-10-CM | POA: Diagnosis not present

## 2017-04-23 DIAGNOSIS — F411 Generalized anxiety disorder: Secondary | ICD-10-CM | POA: Diagnosis not present

## 2017-04-24 DIAGNOSIS — R69 Illness, unspecified: Secondary | ICD-10-CM | POA: Diagnosis not present

## 2017-04-24 DIAGNOSIS — G4700 Insomnia, unspecified: Secondary | ICD-10-CM | POA: Diagnosis not present

## 2017-04-26 DIAGNOSIS — L209 Atopic dermatitis, unspecified: Secondary | ICD-10-CM | POA: Diagnosis not present

## 2017-04-26 DIAGNOSIS — R69 Illness, unspecified: Secondary | ICD-10-CM | POA: Diagnosis not present

## 2017-05-01 DIAGNOSIS — J45909 Unspecified asthma, uncomplicated: Secondary | ICD-10-CM | POA: Diagnosis not present

## 2017-05-01 DIAGNOSIS — I10 Essential (primary) hypertension: Secondary | ICD-10-CM | POA: Diagnosis not present

## 2017-05-01 DIAGNOSIS — D649 Anemia, unspecified: Secondary | ICD-10-CM | POA: Diagnosis not present

## 2017-05-01 DIAGNOSIS — E119 Type 2 diabetes mellitus without complications: Secondary | ICD-10-CM | POA: Diagnosis not present

## 2017-05-01 DIAGNOSIS — M797 Fibromyalgia: Secondary | ICD-10-CM | POA: Diagnosis not present

## 2017-05-01 DIAGNOSIS — E031 Congenital hypothyroidism without goiter: Secondary | ICD-10-CM | POA: Diagnosis not present

## 2017-05-01 DIAGNOSIS — N390 Urinary tract infection, site not specified: Secondary | ICD-10-CM | POA: Diagnosis not present

## 2017-05-01 DIAGNOSIS — R21 Rash and other nonspecific skin eruption: Secondary | ICD-10-CM | POA: Diagnosis not present

## 2017-05-01 DIAGNOSIS — G8929 Other chronic pain: Secondary | ICD-10-CM | POA: Diagnosis not present

## 2017-05-01 DIAGNOSIS — M6281 Muscle weakness (generalized): Secondary | ICD-10-CM | POA: Diagnosis not present

## 2017-05-02 DIAGNOSIS — F339 Major depressive disorder, recurrent, unspecified: Secondary | ICD-10-CM | POA: Diagnosis not present

## 2017-05-02 DIAGNOSIS — R69 Illness, unspecified: Secondary | ICD-10-CM | POA: Diagnosis not present

## 2017-05-02 NOTE — Progress Notes (Deleted)
Office Visit Note  Patient: Ashley Caldwell             Date of Birth: May 27, 1940           MRN: [id number]             PCP: Benita Stabile, MD Referring: Benita Stabile, MD Visit Date: 05/16/2017 Occupation: @GUAROCC @    Subjective:  No chief complaint on file.   History of Present Illness: Ashley Caldwell is a 77 y.o. female ***   Activities of Daily Living:  Patient reports morning stiffness for *** {minute/hour:19697}.   Patient {ACTIONS;DENIES/REPORTS:21021675::"Denies"} nocturnal pain.  Difficulty dressing/grooming: {ACTIONS;DENIES/REPORTS:21021675::"Denies"} Difficulty climbing stairs: {ACTIONS;DENIES/REPORTS:21021675::"Denies"} Difficulty getting out of chair: {ACTIONS;DENIES/REPORTS:21021675::"Denies"} Difficulty using hands for taps, buttons, cutlery, and/or writing: {ACTIONS;DENIES/REPORTS:21021675::"Denies"}   No Rheumatology ROS completed.   PMFS History:  Patient Active Problem List   Diagnosis Date Noted  . High risk medication use 06/12/2016  . Closed fracture of right proximal humerus 05/01/2016  . History of renal failure 04/26/2016  . Primary osteoarthritis of both knees 04/26/2016  . Renal failure 04/13/2016  . Acute renal failure (HCC) 04/13/2016  . Psoriasis 01/18/2016  . Fibromyalgia 01/18/2016  . DDD (degenerative disc disease), lumbar 01/18/2016  . Osteoarthritis of both knees 01/18/2016  . Elevated cholesterol 01/18/2016  . Hypoglycemia associated with diabetes (HCC) 01/16/2011  . Compression fracture of vertebral column (HCC) 01/16/2011  . Hyponatremia 01/14/2011  . Pneumonia 01/13/2011  . Hyperglycemia 01/13/2011  . Weakness generalized 01/13/2011  . DM type 2 (diabetes mellitus, type 2) (HCC) 01/13/2011  . HTN (hypertension), benign 01/13/2011  . Psoriatic arthritis (HCC) 01/13/2011  . Anemia 01/13/2011  . Thyroiditis, chronic 01/13/2011  . Morbid obesity (HCC) 01/13/2011  . Asthma 01/13/2011    Past Medical History:  Diagnosis  Date  . Arthritis   . Asthma   . CFS (chronic fatigue syndrome)   . Compression fracture of vertebral column (HCC) 01/16/2011   per CXR  . DDD (degenerative disc disease), lumbar 01/18/2016  . Diabetes mellitus   . Elevated cholesterol 01/18/2016  . Fibromyalgia 01/18/2016  . Fibromyositis   . Hashimoto's thyroiditis   . HTN (hypertension), benign   . Hypoglycemia associated with diabetes (HCC) 01/16/2011  . Morbid obesity (HCC)   . Osteoarthritis of both knees 01/18/2016  . Pneumonia 12/2010  . Psoriasis 01/18/2016  . Psoriatic arthritis (HCC)   . Thyroid disease     Family History  Problem Relation Age of Onset  . Diabetes Father   . Hypertension Mother   . Stroke Mother   . Cancer Brother   . Cancer Brother    Past Surgical History:  Procedure Laterality Date  . ABDOMINAL HYSTERECTOMY    . ABDOMINAL SURGERY     lapband  . CATARACT EXTRACTION    . KNEE SURGERY     Social History   Social History Narrative  . Not on file     Objective: Vital Signs: There were no vitals taken for this visit.   Physical Exam   Musculoskeletal Exam: ***  CDAI Exam: No CDAI exam completed.    Investigation: No additional findings. CBC Latest Ref Rng & Units 02/22/2017 06/17/2016 04/14/2016  WBC 3.8 - 10.8 Thousand/uL 7.4 6.6 8.6  Hemoglobin 11.7 - 15.5 g/dL 04/16/2016 11.0(L) 12.6  Hematocrit 35.0 - 45.0 % 36.8 34.2(L) 38.1  Platelets 140 - 400 Thousand/uL 282 309 243   CMP Latest Ref Rng & Units 02/22/2017 06/17/2016 04/15/2016  Glucose 65 - 99  mg/dL 82 182(X) 92  BUN 7 - 25 mg/dL 17 4(L) 93(Z)  Creatinine 0.60 - 0.93 mg/dL 1.69 6.78 9.38(B)  Sodium 135 - 146 mmol/L 141 138 134(L)  Potassium 3.5 - 5.3 mmol/L 4.2 3.3(L) 4.5  Chloride 98 - 110 mmol/L 100 101 103  CO2 20 - 32 mmol/L 36(H) 30 26  Calcium 8.6 - 10.4 mg/dL 9.4 0.1(B) 5.1(W)  Total Protein 6.1 - 8.1 g/dL 6.1 - -  Total Bilirubin 0.2 - 1.2 mg/dL 0.5 - -  Alkaline Phos 33 - 130 U/L - - -  AST 10 - 35 U/L 16 - -  ALT 6 -  29 U/L 12 - -    Imaging: No results found.  Speciality Comments: No specialty comments available.    Procedures:  No procedures performed Allergies: Amoxicillin-pot clavulanate; Lyrica [pregabalin]; and Latex   Assessment / Plan:     Visit Diagnoses: No diagnosis found.    Orders: No orders of the defined types were placed in this encounter.  No orders of the defined types were placed in this encounter.   Face-to-face time spent with patient was *** minutes. 50% of time was spent in counseling and coordination of care.  Follow-Up Instructions: No Follow-up on file.   Ellen Henri, CMA  Note - This record has been created using Animal nutritionist.  Chart creation errors have been sought, but may not always  have been located. Such creation errors do not reflect on  the standard of medical care.

## 2017-05-03 DIAGNOSIS — J45901 Unspecified asthma with (acute) exacerbation: Secondary | ICD-10-CM | POA: Diagnosis not present

## 2017-05-03 DIAGNOSIS — R05 Cough: Secondary | ICD-10-CM | POA: Diagnosis not present

## 2017-05-03 DIAGNOSIS — R509 Fever, unspecified: Secondary | ICD-10-CM | POA: Diagnosis not present

## 2017-05-06 DIAGNOSIS — R69 Illness, unspecified: Secondary | ICD-10-CM | POA: Diagnosis not present

## 2017-05-08 DIAGNOSIS — R69 Illness, unspecified: Secondary | ICD-10-CM | POA: Diagnosis not present

## 2017-05-08 DIAGNOSIS — M1990 Unspecified osteoarthritis, unspecified site: Secondary | ICD-10-CM | POA: Diagnosis not present

## 2017-05-08 DIAGNOSIS — M797 Fibromyalgia: Secondary | ICD-10-CM | POA: Diagnosis not present

## 2017-05-08 DIAGNOSIS — G4700 Insomnia, unspecified: Secondary | ICD-10-CM | POA: Diagnosis not present

## 2017-05-08 DIAGNOSIS — G894 Chronic pain syndrome: Secondary | ICD-10-CM | POA: Diagnosis not present

## 2017-05-09 DIAGNOSIS — I8312 Varicose veins of left lower extremity with inflammation: Secondary | ICD-10-CM | POA: Diagnosis not present

## 2017-05-09 DIAGNOSIS — M79605 Pain in left leg: Secondary | ICD-10-CM | POA: Diagnosis not present

## 2017-05-09 DIAGNOSIS — I8311 Varicose veins of right lower extremity with inflammation: Secondary | ICD-10-CM | POA: Diagnosis not present

## 2017-05-09 DIAGNOSIS — I890 Lymphedema, not elsewhere classified: Secondary | ICD-10-CM | POA: Diagnosis not present

## 2017-05-09 DIAGNOSIS — M79604 Pain in right leg: Secondary | ICD-10-CM | POA: Diagnosis not present

## 2017-05-13 DIAGNOSIS — E785 Hyperlipidemia, unspecified: Secondary | ICD-10-CM | POA: Diagnosis not present

## 2017-05-13 DIAGNOSIS — E119 Type 2 diabetes mellitus without complications: Secondary | ICD-10-CM | POA: Diagnosis not present

## 2017-05-13 NOTE — Progress Notes (Signed)
Office Visit Note  Patient: Ashley Caldwell             Date of Birth: [birth year]           MRN: [id number]             PCP: Benita Stabile, MD Referring: Benita Stabile, MD Visit Date: 05/21/2017 Occupation: @GUAROCC @    Subjective:  Joint pain.   History of Present Illness: Ashley Caldwell is a 77 y.o. female with history of psoriatic arthritis, osteoarthritis and fibromyalgia.  She has not had any joint pain or swelling.  She has seen a dermatologist recently and was referred to a vein specialist.  She has not had any active psoriasis.  She continues to have some discomfort due to underlying disc disease and osteoarthritis.  She also has fibromyalgia which contributes to her discomfort.  She is having difficult time adjusting to her new living situation.  Activities of Daily Living:  Patient reports morning stiffness for 10 minutes.   Patient Reports nocturnal pain.  Difficulty dressing/grooming: Reports Difficulty climbing stairs: Reports Difficulty getting out of chair: Reports Difficulty using hands for taps, buttons, cutlery, and/or writing: Reports   Review of Systems  Constitutional: Positive for fatigue. Negative for night sweats, weight gain, weight loss and weakness.  HENT: Negative for mouth sores, trouble swallowing, trouble swallowing, mouth dryness and nose dryness.   Eyes: Negative for pain, redness, visual disturbance and dryness.  Respiratory: Negative for cough, shortness of breath and difficulty breathing.   Cardiovascular: Negative for chest pain, palpitations, hypertension, irregular heartbeat and swelling in legs/feet.  Gastrointestinal: Negative for blood in stool, constipation and diarrhea.  Endocrine: Negative for increased urination.  Genitourinary: Negative for vaginal dryness.  Musculoskeletal: Positive for arthralgias, joint pain, myalgias, morning stiffness and myalgias. Negative for joint swelling, muscle weakness and muscle tenderness.  Skin:  Negative for color change, rash, hair loss, skin tightness, ulcers and sensitivity to sunlight.  Allergic/Immunologic: Negative for susceptible to infections.  Neurological: Negative for dizziness, memory loss and night sweats.  Hematological: Negative for swollen glands.  Psychiatric/Behavioral: Positive for depressed mood and sleep disturbance. The patient is not nervous/anxious.     PMFS History:  Patient Active Problem List   Diagnosis Date Noted  . High risk medication use 06/12/2016  . Closed fracture of right proximal humerus 05/01/2016  . History of renal failure 04/26/2016  . Primary osteoarthritis of both knees 04/26/2016  . Renal failure 04/13/2016  . Acute renal failure (HCC) 04/13/2016  . Psoriasis 01/18/2016  . Fibromyalgia 01/18/2016  . DDD (degenerative disc disease), lumbar 01/18/2016  . Osteoarthritis of both knees 01/18/2016  . Elevated cholesterol 01/18/2016  . Hypoglycemia associated with diabetes (HCC) 01/16/2011  . Compression fracture of vertebral column (HCC) 01/16/2011  . Hyponatremia 01/14/2011  . Pneumonia 01/13/2011  . Hyperglycemia 01/13/2011  . Weakness generalized 01/13/2011  . DM type 2 (diabetes mellitus, type 2) (HCC) 01/13/2011  . HTN (hypertension), benign 01/13/2011  . Psoriatic arthritis (HCC) 01/13/2011  . Anemia 01/13/2011  . Thyroiditis, chronic 01/13/2011  . Morbid obesity (HCC) 01/13/2011  . Asthma 01/13/2011    Past Medical History:  Diagnosis Date  . Arthritis   . Asthma   . CFS (chronic fatigue syndrome)   . Compression fracture of vertebral column (HCC) 01/16/2011   per CXR  . DDD (degenerative disc disease), lumbar 01/18/2016  . Diabetes mellitus   . Elevated cholesterol 01/18/2016  . Fibromyalgia 01/18/2016  . Fibromyositis   .  Hashimoto's thyroiditis   . HTN (hypertension), benign   . Hypoglycemia associated with diabetes (HCC) 01/16/2011  . Morbid obesity (HCC)   . Osteoarthritis of both knees 01/18/2016  . Pneumonia  12/2010  . Psoriasis 01/18/2016  . Psoriatic arthritis (HCC)   . Thyroid disease     Family History  Problem Relation Age of Onset  . Diabetes Father   . Hypertension Mother   . Stroke Mother   . Cancer Brother   . Cancer Brother    Past Surgical History:  Procedure Laterality Date  . ABDOMINAL HYSTERECTOMY    . ABDOMINAL SURGERY     lapband  . CATARACT EXTRACTION    . KNEE SURGERY     Social History   Social History Narrative  . Not on file     Objective: Vital Signs: BP 124/86 (BP Location: Left Arm, Patient Position: Sitting, Cuff Size: Normal)   Pulse 84   Resp 16   Ht 5\' 7"  (1.702 m)   Wt (!) 302 lb (137 kg) Comment: per patient, unable to weight  BMI 47.30 kg/m    Physical Exam  Constitutional: She is oriented to person, place, and time. She appears well-developed and well-nourished.  Wheel chair bound  HENT:  Head: Normocephalic and atraumatic.  Eyes: Conjunctivae and EOM are normal.  Neck: Normal range of motion.  Cardiovascular: Normal rate, regular rhythm, normal heart sounds and intact distal pulses.  Pedal edema with weeping   Pulmonary/Chest: Effort normal and breath sounds normal.  Abdominal: Soft. Bowel sounds are normal.  Lymphadenopathy:    She has no cervical adenopathy.  Neurological: She is alert and oriented to person, place, and time.  Skin: Skin is warm and dry. Capillary refill takes less than 2 seconds.  Psychiatric: She has a normal mood and affect. Her behavior is normal.  Nursing note and vitals reviewed.    Musculoskeletal Exam: Patient is wheelchair-bound.  She has thoracic kyphosis.  Shoulder joints elbow joints wrist joints are good range of motion.  She has mild DIP thickening with no synovitis.  She has good range of motion of her knee joints.  CDAI Exam: No CDAI exam completed.    Investigation: No additional findings.  CBC Latest Ref Rng & Units 02/22/2017 06/17/2016 04/14/2016  WBC 3.8 - 10.8 Thousand/uL 7.4 6.6 8.6    Hemoglobin 11.7 - 15.5 g/dL 42.3 11.0(L) 12.6  Hematocrit 35.0 - 45.0 % 36.8 34.2(L) 38.1  Platelets 140 - 400 Thousand/uL 282 309 243   CMP Latest Ref Rng & Units 02/22/2017 06/17/2016 04/15/2016  Glucose 65 - 99 mg/dL 82 953(U) 92  BUN 7 - 25 mg/dL 17 4(L) 02(B)  Creatinine 0.60 - 0.93 mg/dL 3.43 5.68 6.16(O)  Sodium 135 - 146 mmol/L 141 138 134(L)  Potassium 3.5 - 5.3 mmol/L 4.2 3.3(L) 4.5  Chloride 98 - 110 mmol/L 100 101 103  CO2 20 - 32 mmol/L 36(H) 30 26  Calcium 8.6 - 10.4 mg/dL 9.4 3.7(G) 9.0(S)  Total Protein 6.1 - 8.1 g/dL 6.1 - -  Total Bilirubin 0.2 - 1.2 mg/dL 0.5 - -  Alkaline Phos 33 - 130 U/L - - -  AST 10 - 35 U/L 16 - -  ALT 6 - 29 U/L 12 - -   Imaging: No results found.  Speciality Comments: No specialty comments available.    Procedures:  No procedures performed Allergies: Amoxicillin-pot clavulanate; Lyrica [pregabalin]; and Latex   Assessment / Plan:     Visit Diagnoses: Psoriatic arthritis (  [facility]): Patient has no synovitis on examination.  Her psoriatic arthritis appears to be in remission.  Psoriasis: She has no active lesions.  High risk medication use - She has been off methotrexate for a long time.  Fibromyalgia -she is on multiple medications given by her PCP she continues to have some generalized discomfort.  Chronic fatigue syndrome: Fatigue persists.  Primary osteoarthritis of both knees: She has severe osteoarthritis in her knee joints.  She gets pain medications.  DDD (degenerative disc disease), lumbar: She is chronic pain.  Compression fracture of vertebral column (HCC) - 05/23/16 fracture of L3  Pedal edema - She is on Lasix 20 mg daily.  History of renal failure  Essential hypertension  History of asthma  Hashimoto's thyroiditis  History of diabetes mellitus, type II    Orders: No orders of the defined types were placed in this encounter.  No orders of the defined types were placed in this encounter.   Face-to-face time  spent with patient was 30 minutes.  Greater than 50% of time was spent in counseling and coordination of care.  Follow-Up Instructions: No Follow-up on file.   Pollyann Savoy, MD  Note - This record has been created using Animal nutritionist.  Chart creation errors have been sought, but may not always  have been located. Such creation errors do not reflect on  the standard of medical care.

## 2017-05-14 DIAGNOSIS — R69 Illness, unspecified: Secondary | ICD-10-CM | POA: Diagnosis not present

## 2017-05-15 DIAGNOSIS — L97211 Non-pressure chronic ulcer of right calf limited to breakdown of skin: Secondary | ICD-10-CM | POA: Diagnosis not present

## 2017-05-15 DIAGNOSIS — L97221 Non-pressure chronic ulcer of left calf limited to breakdown of skin: Secondary | ICD-10-CM | POA: Diagnosis not present

## 2017-05-15 DIAGNOSIS — I890 Lymphedema, not elsewhere classified: Secondary | ICD-10-CM | POA: Diagnosis not present

## 2017-05-16 ENCOUNTER — Ambulatory Visit: Payer: Medicare HMO | Admitting: Rheumatology

## 2017-05-21 ENCOUNTER — Ambulatory Visit: Payer: Medicare HMO | Admitting: Rheumatology

## 2017-05-21 ENCOUNTER — Encounter: Payer: Self-pay | Admitting: Physician Assistant

## 2017-05-21 VITALS — BP 124/86 | HR 84 | Resp 16 | Ht 67.0 in | Wt 302.0 lb

## 2017-05-21 DIAGNOSIS — I739 Peripheral vascular disease, unspecified: Secondary | ICD-10-CM | POA: Diagnosis not present

## 2017-05-21 DIAGNOSIS — Z87448 Personal history of other diseases of urinary system: Secondary | ICD-10-CM

## 2017-05-21 DIAGNOSIS — Z8709 Personal history of other diseases of the respiratory system: Secondary | ICD-10-CM | POA: Diagnosis not present

## 2017-05-21 DIAGNOSIS — Z79899 Other long term (current) drug therapy: Secondary | ICD-10-CM

## 2017-05-21 DIAGNOSIS — E119 Type 2 diabetes mellitus without complications: Secondary | ICD-10-CM | POA: Diagnosis not present

## 2017-05-21 DIAGNOSIS — E063 Autoimmune thyroiditis: Secondary | ICD-10-CM

## 2017-05-21 DIAGNOSIS — I10 Essential (primary) hypertension: Secondary | ICD-10-CM | POA: Diagnosis not present

## 2017-05-21 DIAGNOSIS — M5136 Other intervertebral disc degeneration, lumbar region: Secondary | ICD-10-CM

## 2017-05-21 DIAGNOSIS — R5382 Chronic fatigue, unspecified: Secondary | ICD-10-CM | POA: Diagnosis not present

## 2017-05-21 DIAGNOSIS — L409 Psoriasis, unspecified: Secondary | ICD-10-CM

## 2017-05-21 DIAGNOSIS — M170 Bilateral primary osteoarthritis of knee: Secondary | ICD-10-CM

## 2017-05-21 DIAGNOSIS — M4850XA Collapsed vertebra, not elsewhere classified, site unspecified, initial encounter for fracture: Secondary | ICD-10-CM | POA: Diagnosis not present

## 2017-05-21 DIAGNOSIS — M797 Fibromyalgia: Secondary | ICD-10-CM | POA: Diagnosis not present

## 2017-05-21 DIAGNOSIS — R600 Localized edema: Secondary | ICD-10-CM

## 2017-05-21 DIAGNOSIS — L4050 Arthropathic psoriasis, unspecified: Secondary | ICD-10-CM

## 2017-05-21 DIAGNOSIS — G9332 Myalgic encephalomyelitis/chronic fatigue syndrome: Secondary | ICD-10-CM

## 2017-05-21 DIAGNOSIS — Z8639 Personal history of other endocrine, nutritional and metabolic disease: Secondary | ICD-10-CM

## 2017-05-21 DIAGNOSIS — Q845 Enlarged and hypertrophic nails: Secondary | ICD-10-CM | POA: Diagnosis not present

## 2017-05-21 DIAGNOSIS — B351 Tinea unguium: Secondary | ICD-10-CM | POA: Diagnosis not present

## 2017-05-21 DIAGNOSIS — L603 Nail dystrophy: Secondary | ICD-10-CM | POA: Diagnosis not present

## 2017-05-23 DIAGNOSIS — R69 Illness, unspecified: Secondary | ICD-10-CM | POA: Diagnosis not present

## 2017-05-23 DIAGNOSIS — F339 Major depressive disorder, recurrent, unspecified: Secondary | ICD-10-CM | POA: Diagnosis not present

## 2017-05-27 DIAGNOSIS — E119 Type 2 diabetes mellitus without complications: Secondary | ICD-10-CM | POA: Diagnosis not present

## 2017-05-29 DIAGNOSIS — D649 Anemia, unspecified: Secondary | ICD-10-CM | POA: Diagnosis not present

## 2017-05-29 DIAGNOSIS — M797 Fibromyalgia: Secondary | ICD-10-CM | POA: Diagnosis not present

## 2017-05-29 DIAGNOSIS — G8929 Other chronic pain: Secondary | ICD-10-CM | POA: Diagnosis not present

## 2017-05-29 DIAGNOSIS — I10 Essential (primary) hypertension: Secondary | ICD-10-CM | POA: Diagnosis not present

## 2017-05-29 DIAGNOSIS — M6281 Muscle weakness (generalized): Secondary | ICD-10-CM | POA: Diagnosis not present

## 2017-05-29 DIAGNOSIS — L409 Psoriasis, unspecified: Secondary | ICD-10-CM | POA: Diagnosis not present

## 2017-05-29 DIAGNOSIS — E031 Congenital hypothyroidism without goiter: Secondary | ICD-10-CM | POA: Diagnosis not present

## 2017-05-29 DIAGNOSIS — E119 Type 2 diabetes mellitus without complications: Secondary | ICD-10-CM | POA: Diagnosis not present

## 2017-05-29 DIAGNOSIS — J45909 Unspecified asthma, uncomplicated: Secondary | ICD-10-CM | POA: Diagnosis not present

## 2017-05-29 DIAGNOSIS — I890 Lymphedema, not elsewhere classified: Secondary | ICD-10-CM | POA: Diagnosis not present

## 2017-05-30 DIAGNOSIS — F339 Major depressive disorder, recurrent, unspecified: Secondary | ICD-10-CM | POA: Diagnosis not present

## 2017-05-30 DIAGNOSIS — R69 Illness, unspecified: Secondary | ICD-10-CM | POA: Diagnosis not present

## 2017-05-30 DIAGNOSIS — G4700 Insomnia, unspecified: Secondary | ICD-10-CM | POA: Diagnosis not present

## 2017-06-06 DIAGNOSIS — M1990 Unspecified osteoarthritis, unspecified site: Secondary | ICD-10-CM | POA: Diagnosis not present

## 2017-06-06 DIAGNOSIS — G894 Chronic pain syndrome: Secondary | ICD-10-CM | POA: Diagnosis not present

## 2017-06-06 DIAGNOSIS — M797 Fibromyalgia: Secondary | ICD-10-CM | POA: Diagnosis not present

## 2017-06-11 DIAGNOSIS — B373 Candidiasis of vulva and vagina: Secondary | ICD-10-CM | POA: Diagnosis not present

## 2017-06-11 DIAGNOSIS — R300 Dysuria: Secondary | ICD-10-CM | POA: Diagnosis not present

## 2017-06-12 DIAGNOSIS — R69 Illness, unspecified: Secondary | ICD-10-CM | POA: Diagnosis not present

## 2017-06-12 DIAGNOSIS — G4700 Insomnia, unspecified: Secondary | ICD-10-CM | POA: Diagnosis not present

## 2017-06-13 DIAGNOSIS — F339 Major depressive disorder, recurrent, unspecified: Secondary | ICD-10-CM | POA: Diagnosis not present

## 2017-06-13 DIAGNOSIS — R69 Illness, unspecified: Secondary | ICD-10-CM | POA: Diagnosis not present

## 2017-06-19 ENCOUNTER — Ambulatory Visit: Payer: Medicare HMO | Admitting: Rheumatology

## 2017-06-28 DIAGNOSIS — I890 Lymphedema, not elsewhere classified: Secondary | ICD-10-CM | POA: Diagnosis not present

## 2017-06-28 DIAGNOSIS — J45909 Unspecified asthma, uncomplicated: Secondary | ICD-10-CM | POA: Diagnosis not present

## 2017-06-28 DIAGNOSIS — E569 Vitamin deficiency, unspecified: Secondary | ICD-10-CM | POA: Diagnosis not present

## 2017-06-28 DIAGNOSIS — D649 Anemia, unspecified: Secondary | ICD-10-CM | POA: Diagnosis not present

## 2017-06-28 DIAGNOSIS — G8929 Other chronic pain: Secondary | ICD-10-CM | POA: Diagnosis not present

## 2017-06-28 DIAGNOSIS — I10 Essential (primary) hypertension: Secondary | ICD-10-CM | POA: Diagnosis not present

## 2017-06-28 DIAGNOSIS — L409 Psoriasis, unspecified: Secondary | ICD-10-CM | POA: Diagnosis not present

## 2017-06-28 DIAGNOSIS — E031 Congenital hypothyroidism without goiter: Secondary | ICD-10-CM | POA: Diagnosis not present

## 2017-06-28 DIAGNOSIS — E119 Type 2 diabetes mellitus without complications: Secondary | ICD-10-CM | POA: Diagnosis not present

## 2017-06-28 DIAGNOSIS — M6281 Muscle weakness (generalized): Secondary | ICD-10-CM | POA: Diagnosis not present

## 2017-07-08 DIAGNOSIS — M797 Fibromyalgia: Secondary | ICD-10-CM | POA: Diagnosis not present

## 2017-07-08 DIAGNOSIS — G894 Chronic pain syndrome: Secondary | ICD-10-CM | POA: Diagnosis not present

## 2017-07-08 DIAGNOSIS — M1990 Unspecified osteoarthritis, unspecified site: Secondary | ICD-10-CM | POA: Diagnosis not present

## 2017-07-11 DIAGNOSIS — R69 Illness, unspecified: Secondary | ICD-10-CM | POA: Diagnosis not present

## 2017-07-11 DIAGNOSIS — G4700 Insomnia, unspecified: Secondary | ICD-10-CM | POA: Diagnosis not present

## 2017-07-11 DIAGNOSIS — F339 Major depressive disorder, recurrent, unspecified: Secondary | ICD-10-CM | POA: Diagnosis not present

## 2017-07-12 DIAGNOSIS — I890 Lymphedema, not elsewhere classified: Secondary | ICD-10-CM | POA: Diagnosis not present

## 2017-07-12 DIAGNOSIS — M6281 Muscle weakness (generalized): Secondary | ICD-10-CM | POA: Diagnosis not present

## 2017-07-12 DIAGNOSIS — L409 Psoriasis, unspecified: Secondary | ICD-10-CM | POA: Diagnosis not present

## 2017-07-12 DIAGNOSIS — J45909 Unspecified asthma, uncomplicated: Secondary | ICD-10-CM | POA: Diagnosis not present

## 2017-07-12 DIAGNOSIS — R54 Age-related physical debility: Secondary | ICD-10-CM | POA: Diagnosis not present

## 2017-07-12 DIAGNOSIS — I10 Essential (primary) hypertension: Secondary | ICD-10-CM | POA: Diagnosis not present

## 2017-07-12 DIAGNOSIS — D649 Anemia, unspecified: Secondary | ICD-10-CM | POA: Diagnosis not present

## 2017-07-12 DIAGNOSIS — E039 Hypothyroidism, unspecified: Secondary | ICD-10-CM | POA: Diagnosis not present

## 2017-07-12 DIAGNOSIS — J209 Acute bronchitis, unspecified: Secondary | ICD-10-CM | POA: Diagnosis not present

## 2017-07-12 DIAGNOSIS — E119 Type 2 diabetes mellitus without complications: Secondary | ICD-10-CM | POA: Diagnosis not present

## 2017-07-17 DIAGNOSIS — D649 Anemia, unspecified: Secondary | ICD-10-CM | POA: Diagnosis not present

## 2017-07-17 DIAGNOSIS — E119 Type 2 diabetes mellitus without complications: Secondary | ICD-10-CM | POA: Diagnosis not present

## 2017-07-17 DIAGNOSIS — E039 Hypothyroidism, unspecified: Secondary | ICD-10-CM | POA: Diagnosis not present

## 2017-07-19 DIAGNOSIS — I739 Peripheral vascular disease, unspecified: Secondary | ICD-10-CM | POA: Diagnosis not present

## 2017-07-19 DIAGNOSIS — E119 Type 2 diabetes mellitus without complications: Secondary | ICD-10-CM | POA: Diagnosis not present

## 2017-07-23 DIAGNOSIS — I890 Lymphedema, not elsewhere classified: Secondary | ICD-10-CM | POA: Diagnosis not present

## 2017-07-24 DIAGNOSIS — J209 Acute bronchitis, unspecified: Secondary | ICD-10-CM | POA: Diagnosis not present

## 2017-07-29 DIAGNOSIS — L409 Psoriasis, unspecified: Secondary | ICD-10-CM | POA: Diagnosis not present

## 2017-07-29 DIAGNOSIS — E119 Type 2 diabetes mellitus without complications: Secondary | ICD-10-CM | POA: Diagnosis not present

## 2017-07-29 DIAGNOSIS — R54 Age-related physical debility: Secondary | ICD-10-CM | POA: Diagnosis not present

## 2017-07-29 DIAGNOSIS — D649 Anemia, unspecified: Secondary | ICD-10-CM | POA: Diagnosis not present

## 2017-07-29 DIAGNOSIS — J209 Acute bronchitis, unspecified: Secondary | ICD-10-CM | POA: Diagnosis not present

## 2017-07-29 DIAGNOSIS — I890 Lymphedema, not elsewhere classified: Secondary | ICD-10-CM | POA: Diagnosis not present

## 2017-07-29 DIAGNOSIS — I10 Essential (primary) hypertension: Secondary | ICD-10-CM | POA: Diagnosis not present

## 2017-07-29 DIAGNOSIS — M6281 Muscle weakness (generalized): Secondary | ICD-10-CM | POA: Diagnosis not present

## 2017-07-29 DIAGNOSIS — J45909 Unspecified asthma, uncomplicated: Secondary | ICD-10-CM | POA: Diagnosis not present

## 2017-07-29 DIAGNOSIS — E039 Hypothyroidism, unspecified: Secondary | ICD-10-CM | POA: Diagnosis not present

## 2017-08-14 DIAGNOSIS — G894 Chronic pain syndrome: Secondary | ICD-10-CM | POA: Diagnosis not present

## 2017-08-14 DIAGNOSIS — M1990 Unspecified osteoarthritis, unspecified site: Secondary | ICD-10-CM | POA: Diagnosis not present

## 2017-08-14 DIAGNOSIS — R062 Wheezing: Secondary | ICD-10-CM | POA: Diagnosis not present

## 2017-08-14 DIAGNOSIS — M797 Fibromyalgia: Secondary | ICD-10-CM | POA: Diagnosis not present

## 2017-08-23 DIAGNOSIS — I890 Lymphedema, not elsewhere classified: Secondary | ICD-10-CM | POA: Diagnosis not present

## 2017-08-28 DIAGNOSIS — I10 Essential (primary) hypertension: Secondary | ICD-10-CM | POA: Diagnosis not present

## 2017-08-28 DIAGNOSIS — M6281 Muscle weakness (generalized): Secondary | ICD-10-CM | POA: Diagnosis not present

## 2017-08-28 DIAGNOSIS — R54 Age-related physical debility: Secondary | ICD-10-CM | POA: Diagnosis not present

## 2017-08-28 DIAGNOSIS — J45909 Unspecified asthma, uncomplicated: Secondary | ICD-10-CM | POA: Diagnosis not present

## 2017-08-28 DIAGNOSIS — L409 Psoriasis, unspecified: Secondary | ICD-10-CM | POA: Diagnosis not present

## 2017-08-28 DIAGNOSIS — E039 Hypothyroidism, unspecified: Secondary | ICD-10-CM | POA: Diagnosis not present

## 2017-08-28 DIAGNOSIS — D649 Anemia, unspecified: Secondary | ICD-10-CM | POA: Diagnosis not present

## 2017-08-28 DIAGNOSIS — I890 Lymphedema, not elsewhere classified: Secondary | ICD-10-CM | POA: Diagnosis not present

## 2017-08-28 DIAGNOSIS — J209 Acute bronchitis, unspecified: Secondary | ICD-10-CM | POA: Diagnosis not present

## 2017-08-28 DIAGNOSIS — E119 Type 2 diabetes mellitus without complications: Secondary | ICD-10-CM | POA: Diagnosis not present

## 2017-09-05 DIAGNOSIS — R062 Wheezing: Secondary | ICD-10-CM | POA: Diagnosis not present

## 2017-09-05 DIAGNOSIS — R05 Cough: Secondary | ICD-10-CM | POA: Diagnosis not present

## 2017-09-10 DIAGNOSIS — R062 Wheezing: Secondary | ICD-10-CM | POA: Diagnosis not present

## 2017-09-10 DIAGNOSIS — J209 Acute bronchitis, unspecified: Secondary | ICD-10-CM | POA: Diagnosis not present

## 2017-09-10 DIAGNOSIS — R05 Cough: Secondary | ICD-10-CM | POA: Diagnosis not present

## 2017-09-12 DIAGNOSIS — B379 Candidiasis, unspecified: Secondary | ICD-10-CM | POA: Diagnosis not present

## 2017-09-12 DIAGNOSIS — R739 Hyperglycemia, unspecified: Secondary | ICD-10-CM | POA: Diagnosis not present

## 2017-09-12 DIAGNOSIS — R05 Cough: Secondary | ICD-10-CM | POA: Diagnosis not present

## 2017-09-12 DIAGNOSIS — J209 Acute bronchitis, unspecified: Secondary | ICD-10-CM | POA: Diagnosis not present

## 2017-09-22 DIAGNOSIS — I890 Lymphedema, not elsewhere classified: Secondary | ICD-10-CM | POA: Diagnosis not present

## 2017-09-25 DIAGNOSIS — M797 Fibromyalgia: Secondary | ICD-10-CM | POA: Diagnosis not present

## 2017-09-25 DIAGNOSIS — M1990 Unspecified osteoarthritis, unspecified site: Secondary | ICD-10-CM | POA: Diagnosis not present

## 2017-09-25 DIAGNOSIS — R21 Rash and other nonspecific skin eruption: Secondary | ICD-10-CM | POA: Diagnosis not present

## 2017-09-25 DIAGNOSIS — G894 Chronic pain syndrome: Secondary | ICD-10-CM | POA: Diagnosis not present

## 2017-09-25 DIAGNOSIS — R69 Illness, unspecified: Secondary | ICD-10-CM | POA: Diagnosis not present

## 2017-09-25 DIAGNOSIS — G4700 Insomnia, unspecified: Secondary | ICD-10-CM | POA: Diagnosis not present

## 2017-09-26 DIAGNOSIS — I739 Peripheral vascular disease, unspecified: Secondary | ICD-10-CM | POA: Diagnosis not present

## 2017-09-26 DIAGNOSIS — L603 Nail dystrophy: Secondary | ICD-10-CM | POA: Diagnosis not present

## 2017-09-26 DIAGNOSIS — E1159 Type 2 diabetes mellitus with other circulatory complications: Secondary | ICD-10-CM | POA: Diagnosis not present

## 2017-09-26 DIAGNOSIS — B351 Tinea unguium: Secondary | ICD-10-CM | POA: Diagnosis not present

## 2017-10-02 DIAGNOSIS — E039 Hypothyroidism, unspecified: Secondary | ICD-10-CM | POA: Diagnosis not present

## 2017-10-02 DIAGNOSIS — D649 Anemia, unspecified: Secondary | ICD-10-CM | POA: Diagnosis not present

## 2017-10-02 DIAGNOSIS — I890 Lymphedema, not elsewhere classified: Secondary | ICD-10-CM | POA: Diagnosis not present

## 2017-10-02 DIAGNOSIS — M6281 Muscle weakness (generalized): Secondary | ICD-10-CM | POA: Diagnosis not present

## 2017-10-02 DIAGNOSIS — R54 Age-related physical debility: Secondary | ICD-10-CM | POA: Diagnosis not present

## 2017-10-02 DIAGNOSIS — J45909 Unspecified asthma, uncomplicated: Secondary | ICD-10-CM | POA: Diagnosis not present

## 2017-10-02 DIAGNOSIS — J209 Acute bronchitis, unspecified: Secondary | ICD-10-CM | POA: Diagnosis not present

## 2017-10-02 DIAGNOSIS — L409 Psoriasis, unspecified: Secondary | ICD-10-CM | POA: Diagnosis not present

## 2017-10-02 DIAGNOSIS — E119 Type 2 diabetes mellitus without complications: Secondary | ICD-10-CM | POA: Diagnosis not present

## 2017-10-02 DIAGNOSIS — I10 Essential (primary) hypertension: Secondary | ICD-10-CM | POA: Diagnosis not present

## 2017-10-09 DIAGNOSIS — M1990 Unspecified osteoarthritis, unspecified site: Secondary | ICD-10-CM | POA: Diagnosis not present

## 2017-10-09 DIAGNOSIS — G8929 Other chronic pain: Secondary | ICD-10-CM | POA: Diagnosis not present

## 2017-10-10 DIAGNOSIS — M6281 Muscle weakness (generalized): Secondary | ICD-10-CM | POA: Diagnosis not present

## 2017-10-10 DIAGNOSIS — G8929 Other chronic pain: Secondary | ICD-10-CM | POA: Diagnosis not present

## 2017-10-15 DIAGNOSIS — G8929 Other chronic pain: Secondary | ICD-10-CM | POA: Diagnosis not present

## 2017-10-15 DIAGNOSIS — R251 Tremor, unspecified: Secondary | ICD-10-CM | POA: Diagnosis not present

## 2017-10-17 DIAGNOSIS — K5900 Constipation, unspecified: Secondary | ICD-10-CM | POA: Diagnosis not present

## 2017-10-23 DIAGNOSIS — R69 Illness, unspecified: Secondary | ICD-10-CM | POA: Diagnosis not present

## 2017-10-23 DIAGNOSIS — B3789 Other sites of candidiasis: Secondary | ICD-10-CM | POA: Diagnosis not present

## 2017-10-23 DIAGNOSIS — B373 Candidiasis of vulva and vagina: Secondary | ICD-10-CM | POA: Diagnosis not present

## 2017-10-23 DIAGNOSIS — I890 Lymphedema, not elsewhere classified: Secondary | ICD-10-CM | POA: Diagnosis not present

## 2017-10-23 DIAGNOSIS — B356 Tinea cruris: Secondary | ICD-10-CM | POA: Diagnosis not present

## 2017-10-23 DIAGNOSIS — G4700 Insomnia, unspecified: Secondary | ICD-10-CM | POA: Diagnosis not present

## 2017-10-30 DIAGNOSIS — G894 Chronic pain syndrome: Secondary | ICD-10-CM | POA: Diagnosis not present

## 2017-10-30 DIAGNOSIS — M797 Fibromyalgia: Secondary | ICD-10-CM | POA: Diagnosis not present

## 2017-10-30 DIAGNOSIS — M1990 Unspecified osteoarthritis, unspecified site: Secondary | ICD-10-CM | POA: Diagnosis not present

## 2017-11-04 DIAGNOSIS — I890 Lymphedema, not elsewhere classified: Secondary | ICD-10-CM | POA: Diagnosis not present

## 2017-11-04 DIAGNOSIS — E119 Type 2 diabetes mellitus without complications: Secondary | ICD-10-CM | POA: Diagnosis not present

## 2017-11-04 DIAGNOSIS — D649 Anemia, unspecified: Secondary | ICD-10-CM | POA: Diagnosis not present

## 2017-11-04 DIAGNOSIS — E039 Hypothyroidism, unspecified: Secondary | ICD-10-CM | POA: Diagnosis not present

## 2017-11-04 DIAGNOSIS — B379 Candidiasis, unspecified: Secondary | ICD-10-CM | POA: Diagnosis not present

## 2017-11-04 DIAGNOSIS — R54 Age-related physical debility: Secondary | ICD-10-CM | POA: Diagnosis not present

## 2017-11-04 DIAGNOSIS — L409 Psoriasis, unspecified: Secondary | ICD-10-CM | POA: Diagnosis not present

## 2017-11-04 DIAGNOSIS — I10 Essential (primary) hypertension: Secondary | ICD-10-CM | POA: Diagnosis not present

## 2017-11-04 DIAGNOSIS — M6281 Muscle weakness (generalized): Secondary | ICD-10-CM | POA: Diagnosis not present

## 2017-11-04 DIAGNOSIS — J45909 Unspecified asthma, uncomplicated: Secondary | ICD-10-CM | POA: Diagnosis not present

## 2017-11-06 DIAGNOSIS — E119 Type 2 diabetes mellitus without complications: Secondary | ICD-10-CM | POA: Diagnosis not present

## 2017-11-06 DIAGNOSIS — R739 Hyperglycemia, unspecified: Secondary | ICD-10-CM | POA: Diagnosis not present

## 2017-11-14 DIAGNOSIS — D649 Anemia, unspecified: Secondary | ICD-10-CM | POA: Diagnosis not present

## 2017-11-14 DIAGNOSIS — E119 Type 2 diabetes mellitus without complications: Secondary | ICD-10-CM | POA: Diagnosis not present

## 2017-11-14 DIAGNOSIS — I10 Essential (primary) hypertension: Secondary | ICD-10-CM | POA: Diagnosis not present

## 2017-11-14 DIAGNOSIS — E039 Hypothyroidism, unspecified: Secondary | ICD-10-CM | POA: Diagnosis not present

## 2017-11-14 DIAGNOSIS — R739 Hyperglycemia, unspecified: Secondary | ICD-10-CM | POA: Diagnosis not present

## 2017-11-20 DIAGNOSIS — R69 Illness, unspecified: Secondary | ICD-10-CM | POA: Diagnosis not present

## 2017-11-20 DIAGNOSIS — G4700 Insomnia, unspecified: Secondary | ICD-10-CM | POA: Diagnosis not present

## 2017-11-22 DIAGNOSIS — E119 Type 2 diabetes mellitus without complications: Secondary | ICD-10-CM | POA: Diagnosis not present

## 2017-11-22 DIAGNOSIS — R739 Hyperglycemia, unspecified: Secondary | ICD-10-CM | POA: Diagnosis not present

## 2017-11-22 DIAGNOSIS — I890 Lymphedema, not elsewhere classified: Secondary | ICD-10-CM | POA: Diagnosis not present

## 2017-11-22 DIAGNOSIS — Z9111 Patient's noncompliance with dietary regimen: Secondary | ICD-10-CM | POA: Diagnosis not present

## 2017-11-26 DIAGNOSIS — J45909 Unspecified asthma, uncomplicated: Secondary | ICD-10-CM | POA: Diagnosis not present

## 2017-11-26 DIAGNOSIS — E1165 Type 2 diabetes mellitus with hyperglycemia: Secondary | ICD-10-CM | POA: Diagnosis not present

## 2017-11-26 DIAGNOSIS — M6281 Muscle weakness (generalized): Secondary | ICD-10-CM | POA: Diagnosis not present

## 2017-11-26 DIAGNOSIS — I10 Essential (primary) hypertension: Secondary | ICD-10-CM | POA: Diagnosis not present

## 2017-11-26 DIAGNOSIS — E039 Hypothyroidism, unspecified: Secondary | ICD-10-CM | POA: Diagnosis not present

## 2017-11-26 DIAGNOSIS — L409 Psoriasis, unspecified: Secondary | ICD-10-CM | POA: Diagnosis not present

## 2017-11-26 DIAGNOSIS — G8929 Other chronic pain: Secondary | ICD-10-CM | POA: Diagnosis not present

## 2017-11-26 DIAGNOSIS — I890 Lymphedema, not elsewhere classified: Secondary | ICD-10-CM | POA: Diagnosis not present

## 2017-11-26 DIAGNOSIS — D649 Anemia, unspecified: Secondary | ICD-10-CM | POA: Diagnosis not present

## 2017-11-26 DIAGNOSIS — N189 Chronic kidney disease, unspecified: Secondary | ICD-10-CM | POA: Diagnosis not present

## 2017-11-29 DIAGNOSIS — G8929 Other chronic pain: Secondary | ICD-10-CM | POA: Diagnosis not present

## 2017-11-29 DIAGNOSIS — L539 Erythematous condition, unspecified: Secondary | ICD-10-CM | POA: Diagnosis not present

## 2017-11-29 DIAGNOSIS — E119 Type 2 diabetes mellitus without complications: Secondary | ICD-10-CM | POA: Diagnosis not present

## 2017-11-29 DIAGNOSIS — M1990 Unspecified osteoarthritis, unspecified site: Secondary | ICD-10-CM | POA: Diagnosis not present

## 2017-11-29 DIAGNOSIS — M797 Fibromyalgia: Secondary | ICD-10-CM | POA: Diagnosis not present

## 2017-12-06 DIAGNOSIS — M797 Fibromyalgia: Secondary | ICD-10-CM | POA: Diagnosis not present

## 2017-12-06 DIAGNOSIS — M1990 Unspecified osteoarthritis, unspecified site: Secondary | ICD-10-CM | POA: Diagnosis not present

## 2017-12-06 DIAGNOSIS — G894 Chronic pain syndrome: Secondary | ICD-10-CM | POA: Diagnosis not present

## 2017-12-18 DIAGNOSIS — G4700 Insomnia, unspecified: Secondary | ICD-10-CM | POA: Diagnosis not present

## 2017-12-18 DIAGNOSIS — R69 Illness, unspecified: Secondary | ICD-10-CM | POA: Diagnosis not present

## 2017-12-19 DIAGNOSIS — D649 Anemia, unspecified: Secondary | ICD-10-CM | POA: Diagnosis not present

## 2017-12-19 DIAGNOSIS — E119 Type 2 diabetes mellitus without complications: Secondary | ICD-10-CM | POA: Diagnosis not present

## 2017-12-20 DIAGNOSIS — L299 Pruritus, unspecified: Secondary | ICD-10-CM | POA: Diagnosis not present

## 2017-12-22 DIAGNOSIS — I890 Lymphedema, not elsewhere classified: Secondary | ICD-10-CM | POA: Diagnosis not present

## 2017-12-25 DIAGNOSIS — G894 Chronic pain syndrome: Secondary | ICD-10-CM | POA: Diagnosis not present

## 2017-12-25 DIAGNOSIS — M797 Fibromyalgia: Secondary | ICD-10-CM | POA: Diagnosis not present

## 2017-12-25 DIAGNOSIS — L299 Pruritus, unspecified: Secondary | ICD-10-CM | POA: Diagnosis not present

## 2017-12-29 IMAGING — DX DG RIBS W/ CHEST 3+V*L*
4 series · 4 of 4 positions shown · non-contrast
Comparison: Chest radiograph 01/13/2011

CLINICAL DATA: RIGHT lower leg pain, LEFT rib pain at breast area,
bruising at LEFT ribs, slipped and fell 2 days ago, known RIGHT
humeral fracture post fall, diabetes mellitus, hypertension calf

EXAM:
LEFT RIBS AND CHEST - 3+ VIEW

[rib pa (1 of 2)]
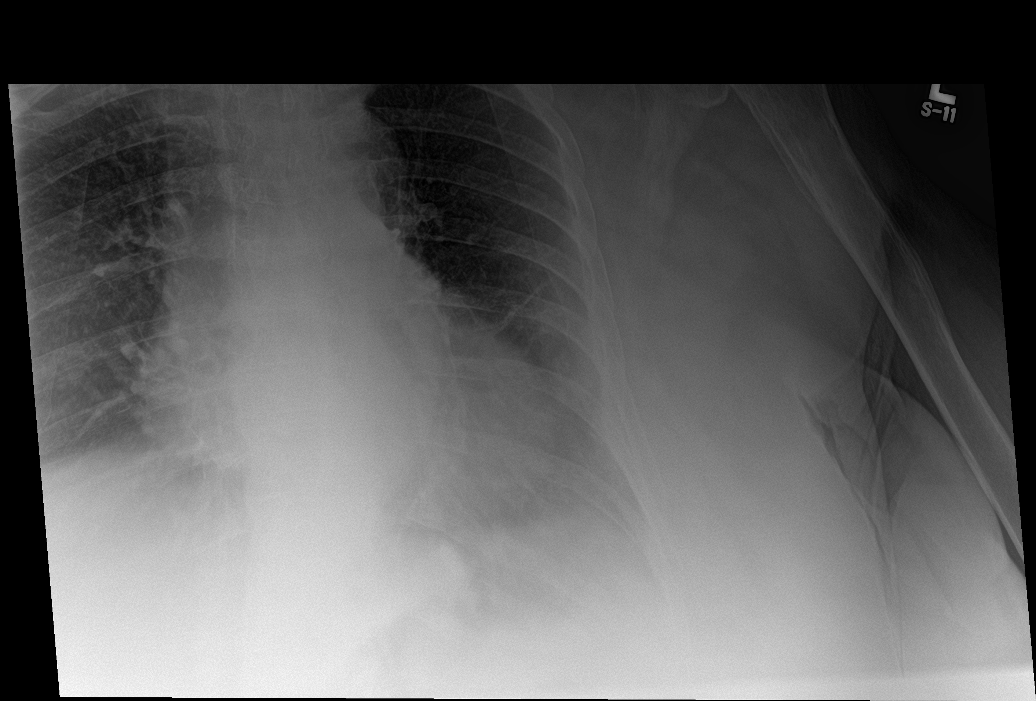

[rib pa obl]
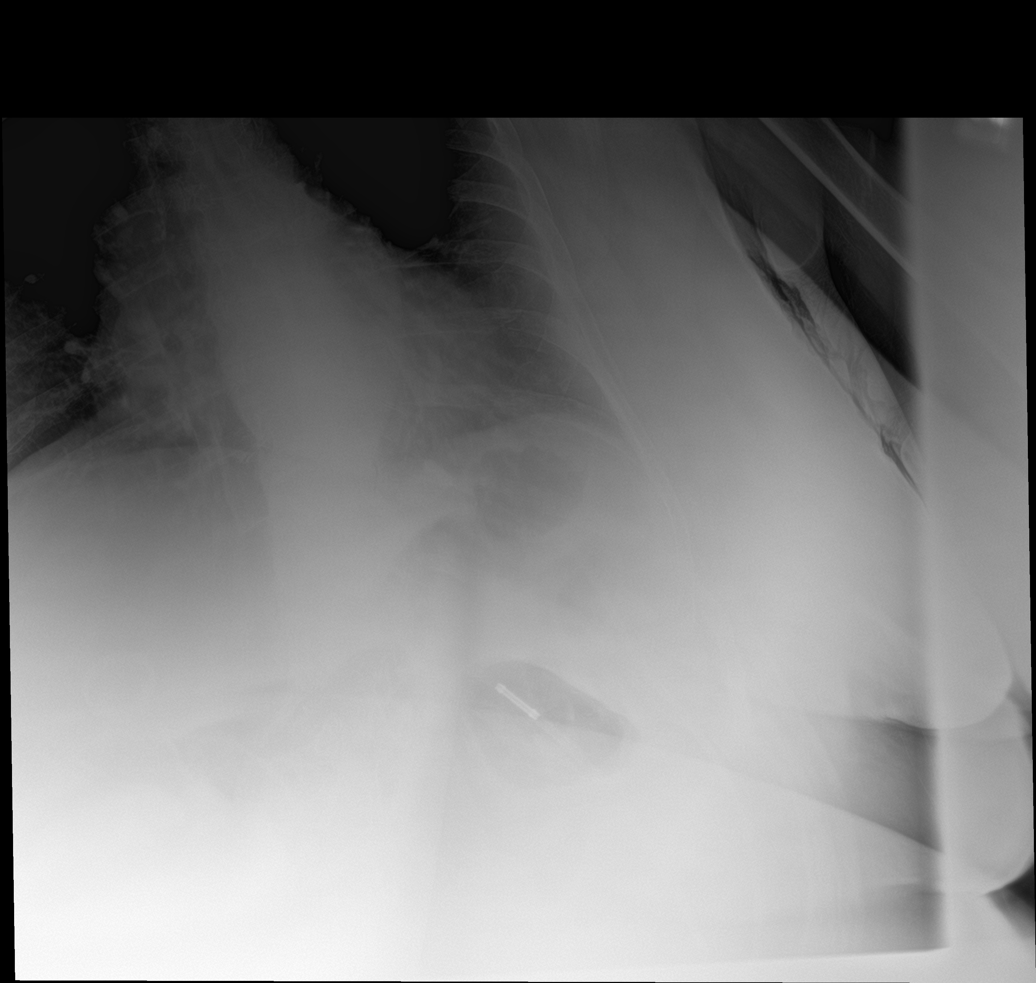

[chest ap]
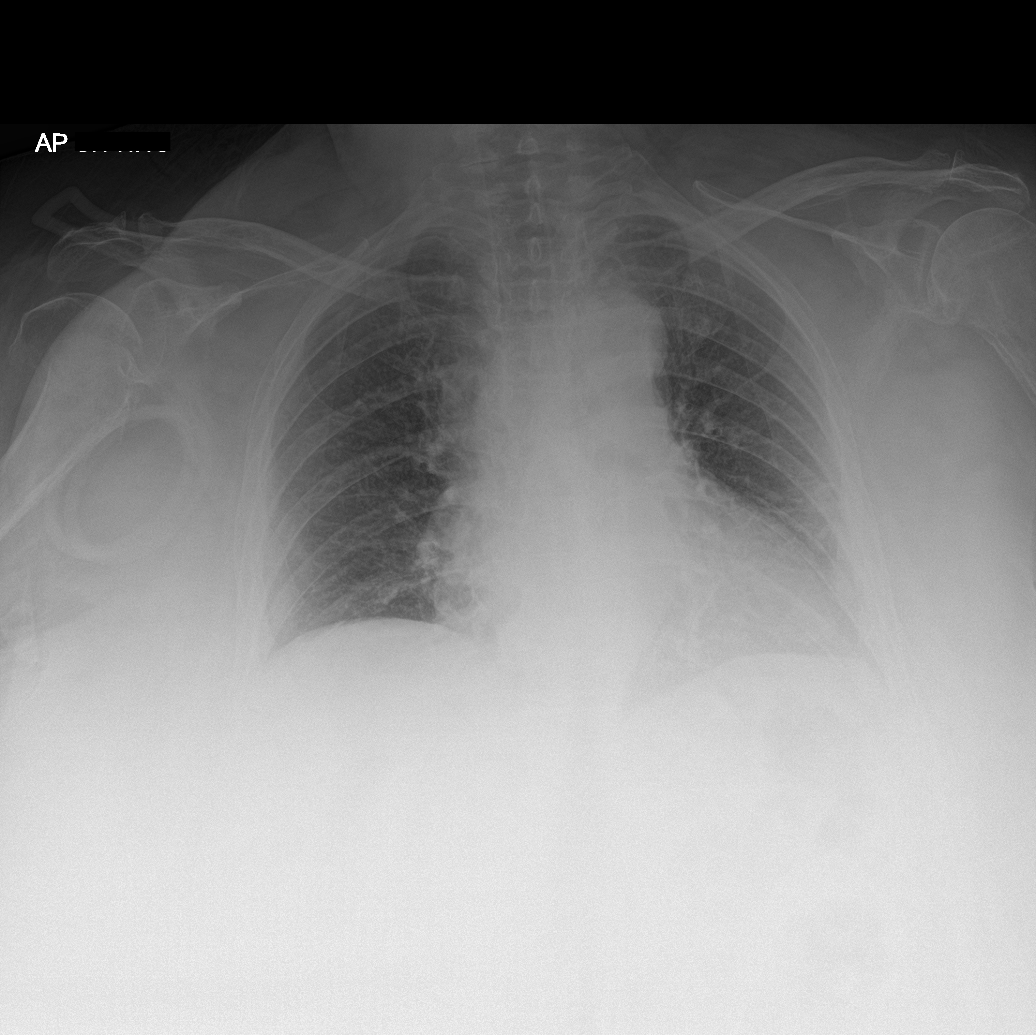

[rib pa (2 of 2)]
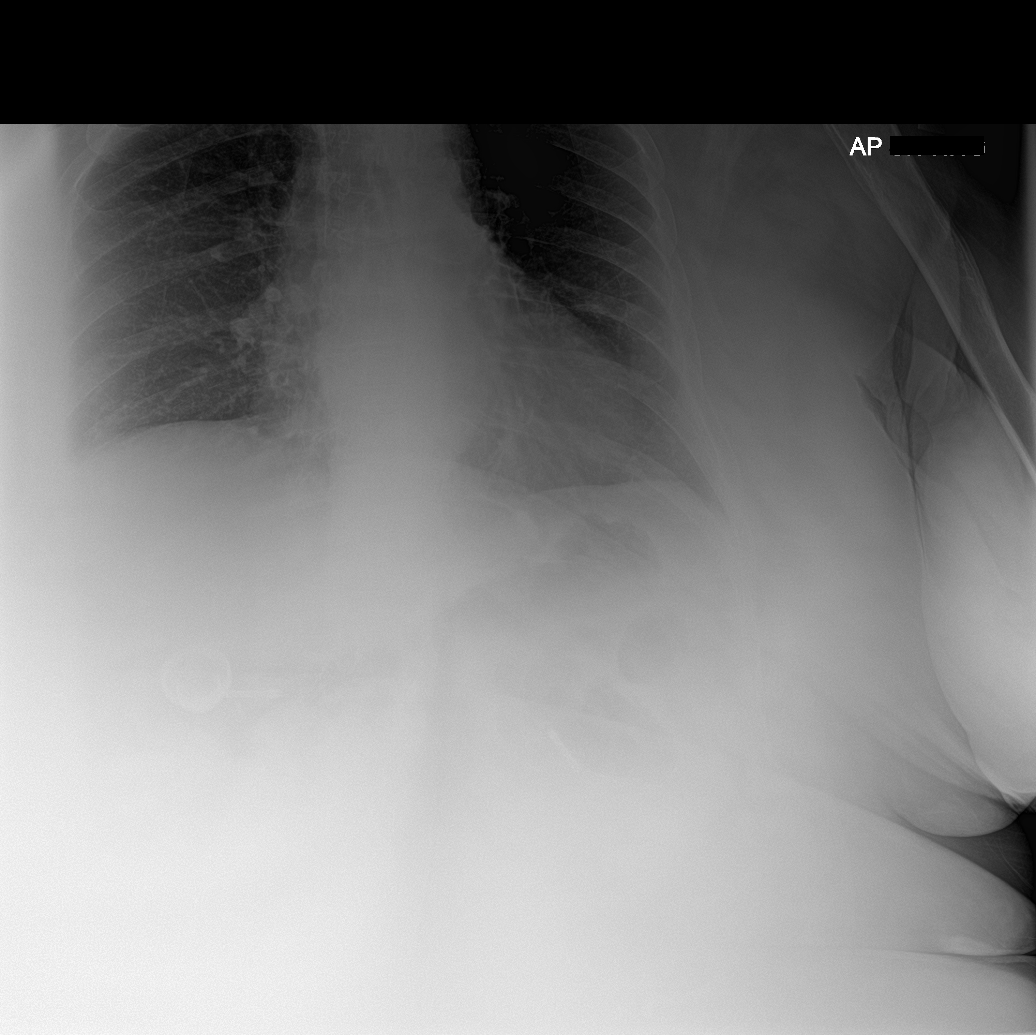

[4 of 4 positions shown; findings below may reference images not displayed]

FINDINGS: Enlargement of cardiac silhouette.

Tortuous aorta.

Mediastinal contours and hilar vascularity normal.

Chronic bronchitic changes without pulmonary infiltrate, pleural
effusion or pneumothorax.

Slight chronic accentuation of LEFT basilar markings appears
unchanged.

Diffuse osseous demineralization.

Rib assessment is limited by the degree of osseous demineralization.

No definite rib fractures of bone destruction.

Prior laparoscopic gastric banding.
IMPRESSION: Enlargement of cardiac silhouette.

Minimal chronic lung changes without acute infiltrate.

Osseous demineralization without definite LEFT rib fracture.

## 2018-01-10 DIAGNOSIS — R21 Rash and other nonspecific skin eruption: Secondary | ICD-10-CM | POA: Diagnosis not present

## 2018-01-10 DIAGNOSIS — E1165 Type 2 diabetes mellitus with hyperglycemia: Secondary | ICD-10-CM | POA: Diagnosis not present

## 2018-01-11 DIAGNOSIS — E119 Type 2 diabetes mellitus without complications: Secondary | ICD-10-CM | POA: Diagnosis not present

## 2018-01-20 DIAGNOSIS — E1165 Type 2 diabetes mellitus with hyperglycemia: Secondary | ICD-10-CM | POA: Diagnosis not present

## 2018-01-20 DIAGNOSIS — M6281 Muscle weakness (generalized): Secondary | ICD-10-CM | POA: Diagnosis not present

## 2018-01-20 DIAGNOSIS — I890 Lymphedema, not elsewhere classified: Secondary | ICD-10-CM | POA: Diagnosis not present

## 2018-01-20 DIAGNOSIS — J45909 Unspecified asthma, uncomplicated: Secondary | ICD-10-CM | POA: Diagnosis not present

## 2018-01-20 DIAGNOSIS — G8929 Other chronic pain: Secondary | ICD-10-CM | POA: Diagnosis not present

## 2018-01-20 DIAGNOSIS — R54 Age-related physical debility: Secondary | ICD-10-CM | POA: Diagnosis not present

## 2018-01-20 DIAGNOSIS — E039 Hypothyroidism, unspecified: Secondary | ICD-10-CM | POA: Diagnosis not present

## 2018-01-20 DIAGNOSIS — L409 Psoriasis, unspecified: Secondary | ICD-10-CM | POA: Diagnosis not present

## 2018-01-20 DIAGNOSIS — I10 Essential (primary) hypertension: Secondary | ICD-10-CM | POA: Diagnosis not present

## 2018-01-20 DIAGNOSIS — D649 Anemia, unspecified: Secondary | ICD-10-CM | POA: Diagnosis not present

## 2018-01-22 DIAGNOSIS — I890 Lymphedema, not elsewhere classified: Secondary | ICD-10-CM | POA: Diagnosis not present

## 2018-01-25 IMAGING — US US RENAL
1 series · 14 of 25 positions shown · non-contrast
Comparison: None.

CLINICAL DATA: Acute renal failure

EXAM:
RENAL / URINARY TRACT ULTRASOUND COMPLETE

[Series 1: us renal · 0.27mm/px · 14 of 37 slices shown]
[im 1/37]
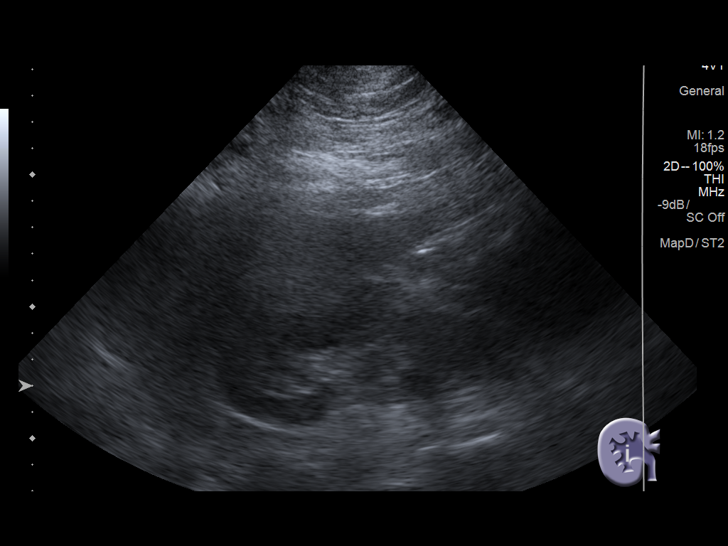
[im 4/37]
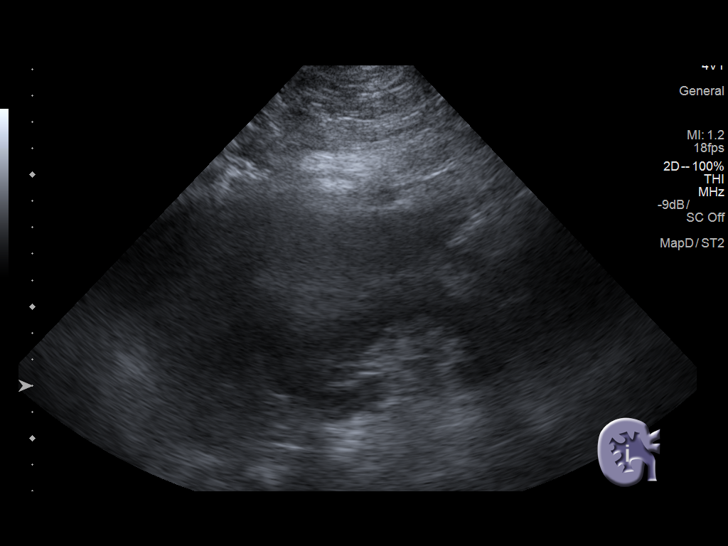
[im 7/37]
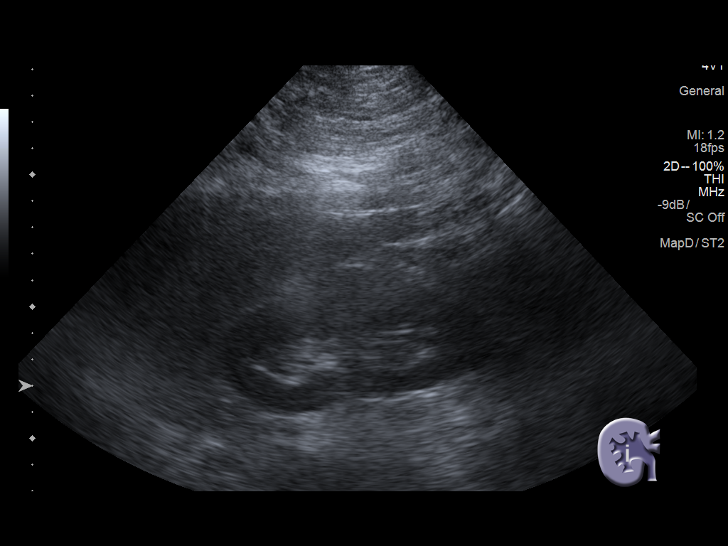
[im 10/37]
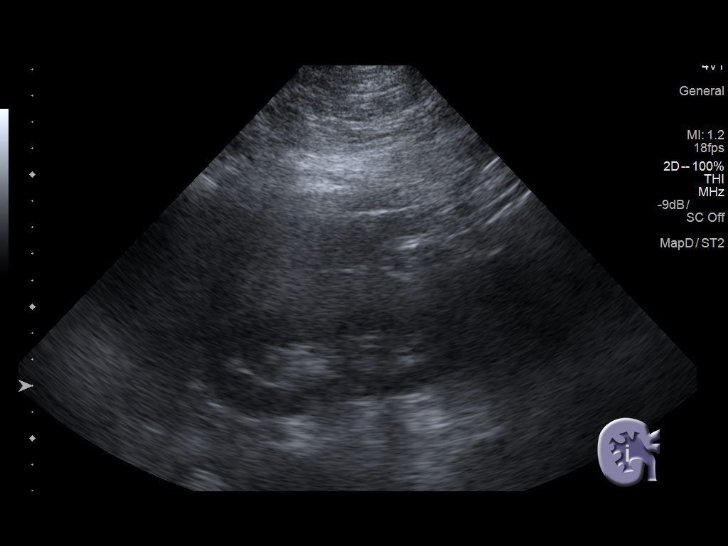
[im 13/37]
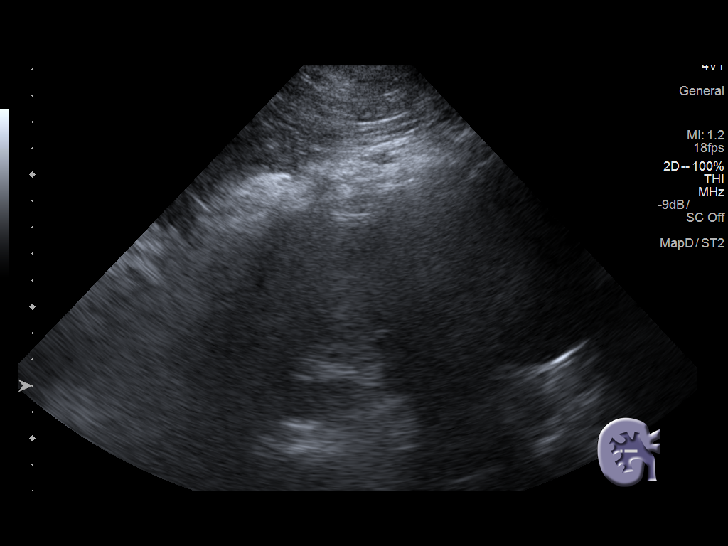
[im 14/37]
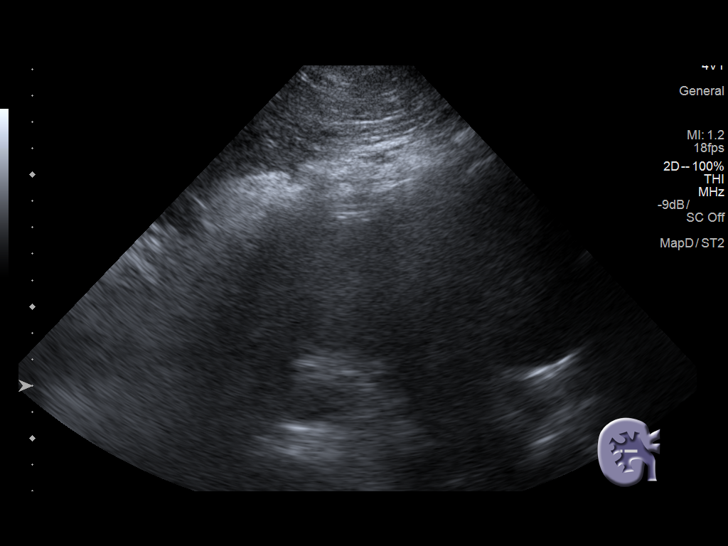
[im 17/37]
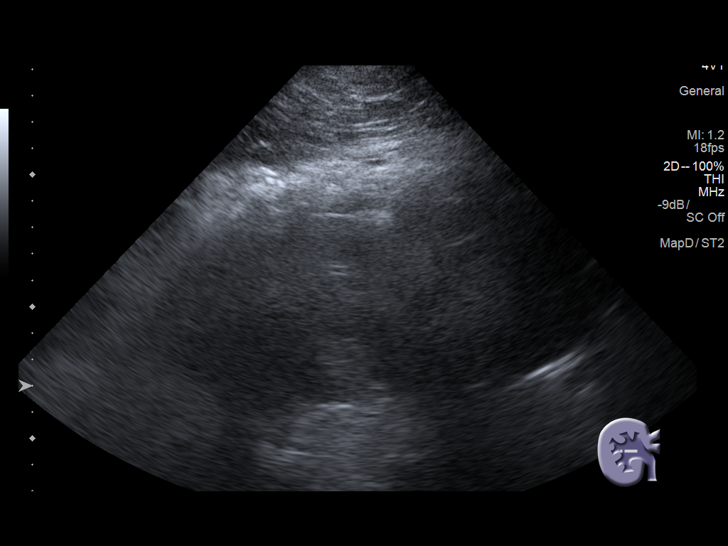
[im 20/37]
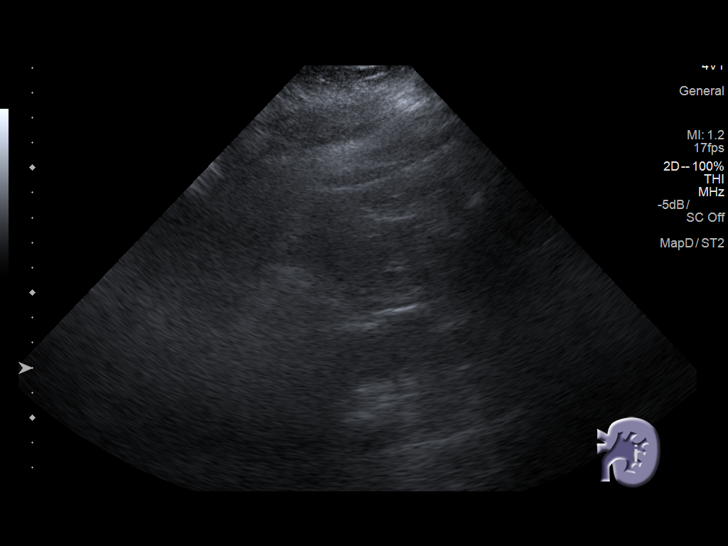
[im 23/37]
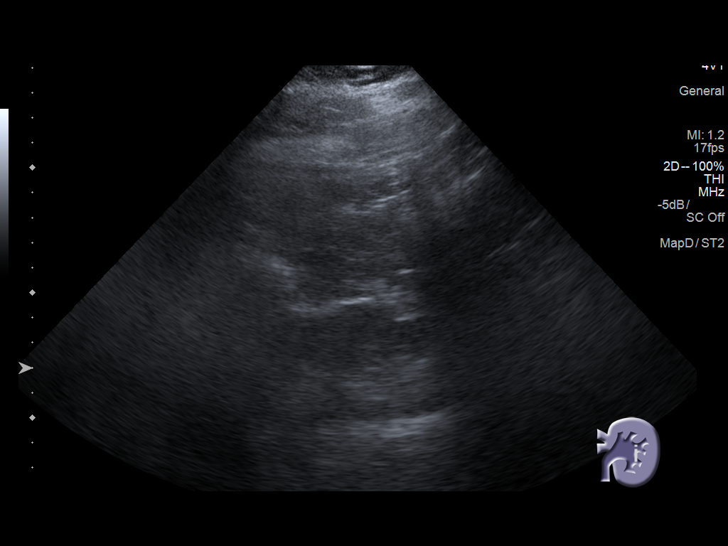
[im 25/37]
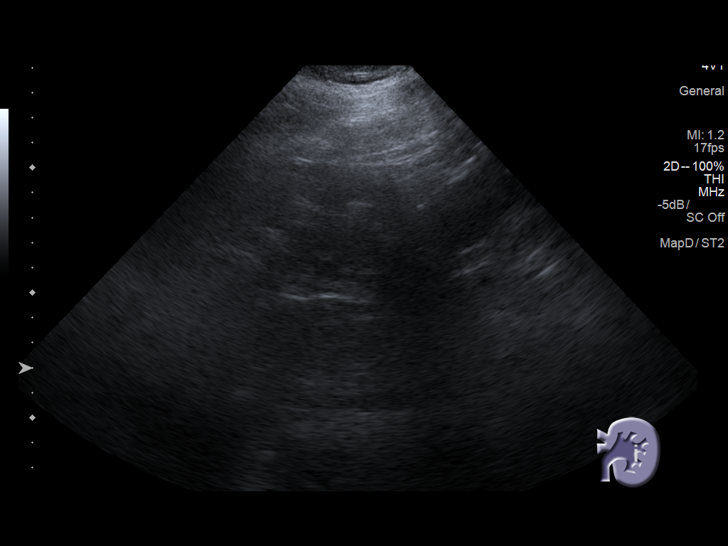
[im 28/37]
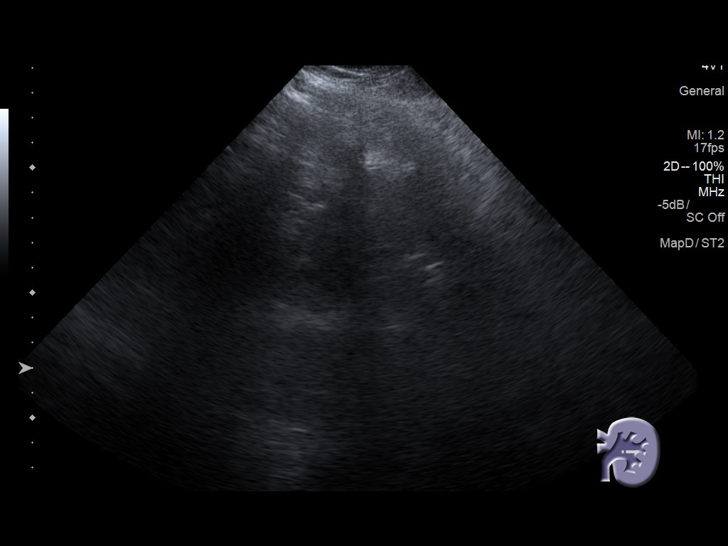
[im 31/37]
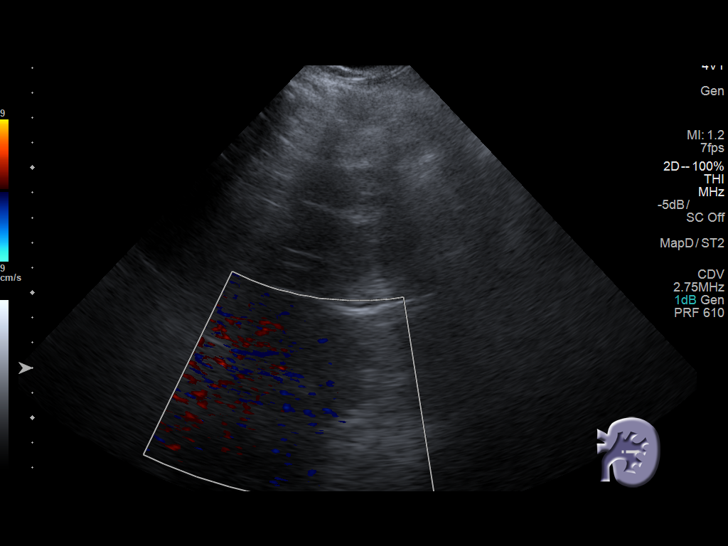
[im 34/37]
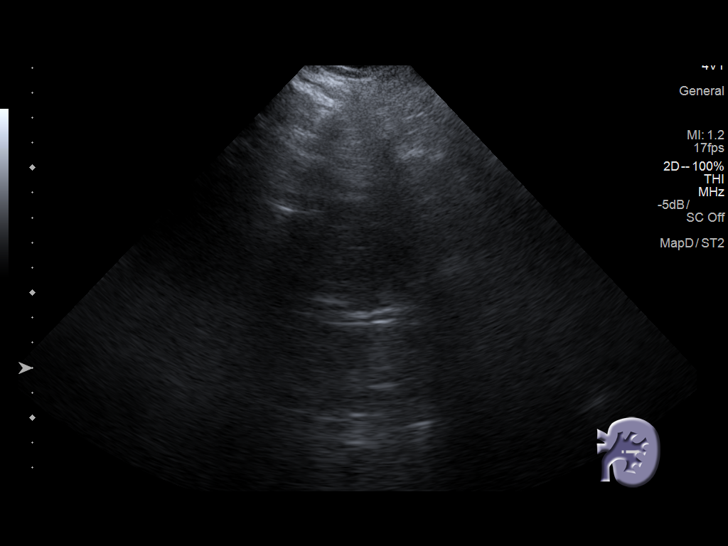
[im 37/37]
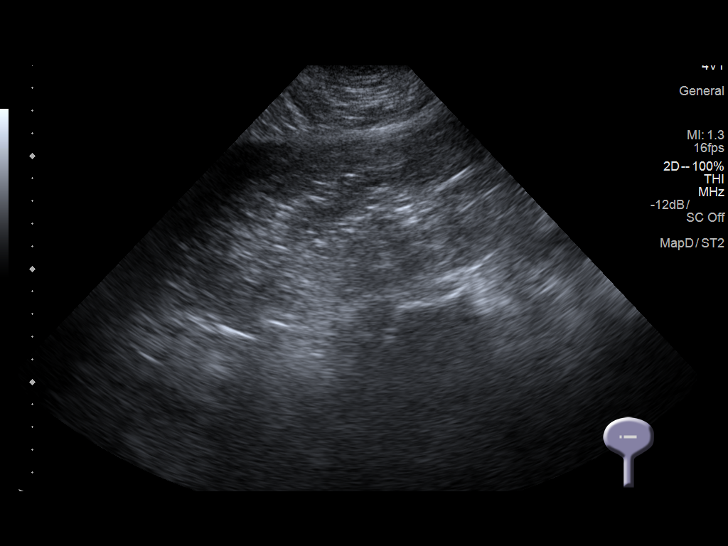

[14 of 25 positions shown; findings below may reference images not displayed]

FINDINGS: Right Kidney:

Length: 10.1 cm. Echogenicity within normal limits. No mass or
hydronephrosis visualized.

Left Kidney:

Length: 9.3 cm, difficult to visualize due to body habitus and
overlying bowel gas. Echogenicity within normal limits. No mass or
hydronephrosis visualized.

Bladder:

Not distended, grossly unremarkable.
IMPRESSION: No acute findings.  No hydronephrosis.

## 2018-01-27 DIAGNOSIS — E569 Vitamin deficiency, unspecified: Secondary | ICD-10-CM | POA: Diagnosis not present

## 2018-01-27 DIAGNOSIS — A78 Q fever: Secondary | ICD-10-CM | POA: Diagnosis not present

## 2018-01-27 DIAGNOSIS — M6281 Muscle weakness (generalized): Secondary | ICD-10-CM | POA: Diagnosis not present

## 2018-01-27 DIAGNOSIS — G4700 Insomnia, unspecified: Secondary | ICD-10-CM | POA: Diagnosis not present

## 2018-01-27 DIAGNOSIS — J45909 Unspecified asthma, uncomplicated: Secondary | ICD-10-CM | POA: Diagnosis not present

## 2018-01-27 DIAGNOSIS — E065 Other chronic thyroiditis: Secondary | ICD-10-CM | POA: Diagnosis not present

## 2018-01-27 DIAGNOSIS — N178 Other acute kidney failure: Secondary | ICD-10-CM | POA: Diagnosis not present

## 2018-01-27 DIAGNOSIS — R279 Unspecified lack of coordination: Secondary | ICD-10-CM | POA: Diagnosis not present

## 2018-01-27 DIAGNOSIS — E039 Hypothyroidism, unspecified: Secondary | ICD-10-CM | POA: Diagnosis not present

## 2018-01-27 DIAGNOSIS — G8929 Other chronic pain: Secondary | ICD-10-CM | POA: Diagnosis not present

## 2018-01-29 DIAGNOSIS — L84 Corns and callosities: Secondary | ICD-10-CM | POA: Diagnosis not present

## 2018-01-29 DIAGNOSIS — L603 Nail dystrophy: Secondary | ICD-10-CM | POA: Diagnosis not present

## 2018-01-29 DIAGNOSIS — B351 Tinea unguium: Secondary | ICD-10-CM | POA: Diagnosis not present

## 2018-01-29 DIAGNOSIS — Q845 Enlarged and hypertrophic nails: Secondary | ICD-10-CM | POA: Diagnosis not present

## 2018-01-29 DIAGNOSIS — I739 Peripheral vascular disease, unspecified: Secondary | ICD-10-CM | POA: Diagnosis not present

## 2018-01-29 DIAGNOSIS — E1159 Type 2 diabetes mellitus with other circulatory complications: Secondary | ICD-10-CM | POA: Diagnosis not present

## 2018-02-05 DIAGNOSIS — E119 Type 2 diabetes mellitus without complications: Secondary | ICD-10-CM | POA: Diagnosis not present

## 2018-02-05 DIAGNOSIS — E162 Hypoglycemia, unspecified: Secondary | ICD-10-CM | POA: Diagnosis not present

## 2018-02-07 DIAGNOSIS — M25562 Pain in left knee: Secondary | ICD-10-CM | POA: Diagnosis not present

## 2018-02-07 DIAGNOSIS — M25561 Pain in right knee: Secondary | ICD-10-CM | POA: Diagnosis not present

## 2018-02-07 DIAGNOSIS — M170 Bilateral primary osteoarthritis of knee: Secondary | ICD-10-CM | POA: Diagnosis not present

## 2018-02-11 DIAGNOSIS — R54 Age-related physical debility: Secondary | ICD-10-CM | POA: Diagnosis not present

## 2018-02-11 DIAGNOSIS — I10 Essential (primary) hypertension: Secondary | ICD-10-CM | POA: Diagnosis not present

## 2018-02-11 DIAGNOSIS — N189 Chronic kidney disease, unspecified: Secondary | ICD-10-CM | POA: Diagnosis not present

## 2018-02-11 DIAGNOSIS — G8929 Other chronic pain: Secondary | ICD-10-CM | POA: Diagnosis not present

## 2018-02-11 DIAGNOSIS — E039 Hypothyroidism, unspecified: Secondary | ICD-10-CM | POA: Diagnosis not present

## 2018-02-11 DIAGNOSIS — E1165 Type 2 diabetes mellitus with hyperglycemia: Secondary | ICD-10-CM | POA: Diagnosis not present

## 2018-02-11 DIAGNOSIS — I890 Lymphedema, not elsewhere classified: Secondary | ICD-10-CM | POA: Diagnosis not present

## 2018-02-11 DIAGNOSIS — L409 Psoriasis, unspecified: Secondary | ICD-10-CM | POA: Diagnosis not present

## 2018-02-11 DIAGNOSIS — J449 Chronic obstructive pulmonary disease, unspecified: Secondary | ICD-10-CM | POA: Diagnosis not present

## 2018-02-11 DIAGNOSIS — M6281 Muscle weakness (generalized): Secondary | ICD-10-CM | POA: Diagnosis not present

## 2018-02-12 DIAGNOSIS — M797 Fibromyalgia: Secondary | ICD-10-CM | POA: Diagnosis not present

## 2018-02-12 DIAGNOSIS — M1990 Unspecified osteoarthritis, unspecified site: Secondary | ICD-10-CM | POA: Diagnosis not present

## 2018-02-12 DIAGNOSIS — G894 Chronic pain syndrome: Secondary | ICD-10-CM | POA: Diagnosis not present

## 2018-02-21 DIAGNOSIS — I890 Lymphedema, not elsewhere classified: Secondary | ICD-10-CM | POA: Diagnosis not present

## 2018-03-05 DIAGNOSIS — G4701 Insomnia due to medical condition: Secondary | ICD-10-CM | POA: Diagnosis not present

## 2018-03-05 DIAGNOSIS — R69 Illness, unspecified: Secondary | ICD-10-CM | POA: Diagnosis not present

## 2018-03-18 DIAGNOSIS — R05 Cough: Secondary | ICD-10-CM | POA: Diagnosis not present

## 2018-03-18 DIAGNOSIS — R0982 Postnasal drip: Secondary | ICD-10-CM | POA: Diagnosis not present

## 2018-03-18 DIAGNOSIS — J4520 Mild intermittent asthma, uncomplicated: Secondary | ICD-10-CM | POA: Diagnosis not present

## 2018-03-18 DIAGNOSIS — J018 Other acute sinusitis: Secondary | ICD-10-CM | POA: Diagnosis not present

## 2018-04-16 ENCOUNTER — Telehealth: Payer: Self-pay | Admitting: Internal Medicine

## 2018-04-16 ENCOUNTER — Other Ambulatory Visit: Payer: Self-pay | Admitting: Internal Medicine

## 2018-04-16 ENCOUNTER — Ambulatory Visit (INDEPENDENT_AMBULATORY_CARE_PROVIDER_SITE_OTHER): Payer: Medicare HMO | Admitting: Internal Medicine

## 2018-04-16 DIAGNOSIS — R0600 Dyspnea, unspecified: Secondary | ICD-10-CM

## 2018-04-16 LAB — PULMONARY FUNCTION TEST
DL/VA % pred: 98 %
DL/VA: 5.14 ml/min/mmHg/L
DLCO UNC % PRED: 69 %
DLCO UNC: 20.53 ml/min/mmHg
FEF 25-75 PRE: 2.92 L/s
FEF 25-75 Post: 3.16 L/sec
FEF2575-%CHANGE-POST: 8 %
FEF2575-%PRED-POST: 166 %
FEF2575-%Pred-Pre: 154 %
FEV1-%Change-Post: 1 %
FEV1-%PRED-POST: 72 %
FEV1-%PRED-PRE: 71 %
FEV1-POST: 1.82 L
FEV1-Pre: 1.79 L
FEV1FVC-%CHANGE-POST: 1 %
FEV1FVC-%Pred-Pre: 118 %
FEV6-%CHANGE-POST: 0 %
FEV6-%PRED-POST: 63 %
FEV6-%PRED-PRE: 63 %
FEV6-POST: 2 L
FEV6-PRE: 2.01 L
FEV6FVC-%Change-Post: 0 %
FEV6FVC-%Pred-Post: 104 %
FEV6FVC-%Pred-Pre: 104 %
FVC-%CHANGE-POST: 0 %
FVC-%Pred-Post: 60 %
FVC-%Pred-Pre: 60 %
FVC-Post: 2 L
FVC-Pre: 2.01 L
PRE FEV6/FVC RATIO: 100 %
Post FEV1/FVC ratio: 91 %
Post FEV6/FVC ratio: 100 %
Pre FEV1/FVC ratio: 89 %

## 2018-04-16 NOTE — Progress Notes (Signed)
PFT done today. 

## 2018-04-16 NOTE — Telephone Encounter (Signed)
PFT printed and Faxed. Confirmation received. Call made to [facility] to make aware PFT has been faxed and to call us if they do not receive it. Voiced understanding. Nothing further is needed at this time.

## 2019-04-22 ENCOUNTER — Inpatient Hospital Stay (HOSPITAL_COMMUNITY)
Admission: EM | Admit: 2019-04-22 | Discharge: 2019-04-27 | DRG: 603 | Disposition: A | Discharge status: Skilled Nursing Facility | Payer: Medicare HMO | Source: Skilled Nursing Facility | Attending: Internal Medicine | Admitting: Internal Medicine

## 2019-04-22 ENCOUNTER — Emergency Department (HOSPITAL_COMMUNITY): Payer: Medicare HMO

## 2019-04-22 ENCOUNTER — Encounter (HOSPITAL_COMMUNITY): Payer: Self-pay

## 2019-04-22 DIAGNOSIS — B0230 Zoster ocular disease, unspecified: Secondary | ICD-10-CM | POA: Diagnosis present

## 2019-04-22 DIAGNOSIS — F1120 Opioid dependence, uncomplicated: Secondary | ICD-10-CM | POA: Diagnosis present

## 2019-04-22 DIAGNOSIS — Z79891 Long term (current) use of opiate analgesic: Secondary | ICD-10-CM

## 2019-04-22 DIAGNOSIS — I10 Essential (primary) hypertension: Secondary | ICD-10-CM | POA: Diagnosis present

## 2019-04-22 DIAGNOSIS — Z833 Family history of diabetes mellitus: Secondary | ICD-10-CM

## 2019-04-22 DIAGNOSIS — M170 Bilateral primary osteoarthritis of knee: Secondary | ICD-10-CM | POA: Diagnosis present

## 2019-04-22 DIAGNOSIS — R7989 Other specified abnormal findings of blood chemistry: Secondary | ICD-10-CM | POA: Diagnosis present

## 2019-04-22 DIAGNOSIS — G894 Chronic pain syndrome: Secondary | ICD-10-CM | POA: Diagnosis present

## 2019-04-22 DIAGNOSIS — Z8249 Family history of ischemic heart disease and other diseases of the circulatory system: Secondary | ICD-10-CM

## 2019-04-22 DIAGNOSIS — I4891 Unspecified atrial fibrillation: Secondary | ICD-10-CM | POA: Diagnosis present

## 2019-04-22 DIAGNOSIS — E7800 Pure hypercholesterolemia, unspecified: Secondary | ICD-10-CM | POA: Diagnosis present

## 2019-04-22 DIAGNOSIS — Z91040 Latex allergy status: Secondary | ICD-10-CM

## 2019-04-22 DIAGNOSIS — Z7951 Long term (current) use of inhaled steroids: Secondary | ICD-10-CM

## 2019-04-22 DIAGNOSIS — L4050 Arthropathic psoriasis, unspecified: Secondary | ICD-10-CM | POA: Diagnosis present

## 2019-04-22 DIAGNOSIS — K5903 Drug induced constipation: Secondary | ICD-10-CM | POA: Diagnosis present

## 2019-04-22 DIAGNOSIS — E11649 Type 2 diabetes mellitus with hypoglycemia without coma: Secondary | ICD-10-CM | POA: Diagnosis not present

## 2019-04-22 DIAGNOSIS — L03213 Periorbital cellulitis: Secondary | ICD-10-CM

## 2019-04-22 DIAGNOSIS — Z809 Family history of malignant neoplasm, unspecified: Secondary | ICD-10-CM

## 2019-04-22 DIAGNOSIS — Z66 Do not resuscitate: Secondary | ICD-10-CM | POA: Diagnosis present

## 2019-04-22 DIAGNOSIS — Z8616 Personal history of COVID-19: Secondary | ICD-10-CM

## 2019-04-22 DIAGNOSIS — Z888 Allergy status to other drugs, medicaments and biological substances status: Secondary | ICD-10-CM

## 2019-04-22 DIAGNOSIS — J45909 Unspecified asthma, uncomplicated: Secondary | ICD-10-CM | POA: Diagnosis present

## 2019-04-22 DIAGNOSIS — I480 Paroxysmal atrial fibrillation: Secondary | ICD-10-CM | POA: Diagnosis present

## 2019-04-22 DIAGNOSIS — I4819 Other persistent atrial fibrillation: Secondary | ICD-10-CM

## 2019-04-22 DIAGNOSIS — Z79890 Hormone replacement therapy (postmenopausal): Secondary | ICD-10-CM

## 2019-04-22 DIAGNOSIS — Z794 Long term (current) use of insulin: Secondary | ICD-10-CM

## 2019-04-22 DIAGNOSIS — E1142 Type 2 diabetes mellitus with diabetic polyneuropathy: Secondary | ICD-10-CM

## 2019-04-22 DIAGNOSIS — E119 Type 2 diabetes mellitus without complications: Secondary | ICD-10-CM

## 2019-04-22 DIAGNOSIS — M797 Fibromyalgia: Secondary | ICD-10-CM | POA: Diagnosis present

## 2019-04-22 DIAGNOSIS — Z823 Family history of stroke: Secondary | ICD-10-CM

## 2019-04-22 DIAGNOSIS — E0580 Other thyrotoxicosis without thyrotoxic crisis or storm: Secondary | ICD-10-CM | POA: Diagnosis present

## 2019-04-22 DIAGNOSIS — M5136 Other intervertebral disc degeneration, lumbar region: Secondary | ICD-10-CM | POA: Diagnosis present

## 2019-04-22 DIAGNOSIS — Z880 Allergy status to penicillin: Secondary | ICD-10-CM

## 2019-04-22 DIAGNOSIS — Z87891 Personal history of nicotine dependence: Secondary | ICD-10-CM

## 2019-04-22 DIAGNOSIS — T402X5A Adverse effect of other opioids, initial encounter: Secondary | ICD-10-CM | POA: Diagnosis present

## 2019-04-22 LAB — CBC WITH DIFFERENTIAL/PLATELET
Abs Immature Granulocytes: 0.05 10*3/uL (ref 0.00–0.07)
Basophils Absolute: 0 10*3/uL (ref 0.0–0.1)
Basophils Relative: 1 %
Eosinophils Absolute: 0 10*3/uL (ref 0.0–0.5)
Eosinophils Relative: 1 %
HCT: 43.6 % (ref 36.0–46.0)
Hemoglobin: 13.8 g/dL (ref 12.0–15.0)
Immature Granulocytes: 1 %
Lymphocytes Relative: 14 %
Lymphs Abs: 0.9 10*3/uL (ref 0.7–4.0)
MCH: 28.6 pg (ref 26.0–34.0)
MCHC: 31.7 g/dL (ref 30.0–36.0)
MCV: 90.5 fL (ref 80.0–100.0)
Monocytes Absolute: 1 10*3/uL (ref 0.1–1.0)
Monocytes Relative: 15 %
Neutro Abs: 4.6 10*3/uL (ref 1.7–7.7)
Neutrophils Relative %: 68 %
Platelets: 298 10*3/uL (ref 150–400)
RBC: 4.82 MIL/uL (ref 3.87–5.11)
RDW: 15.1 % (ref 11.5–15.5)
WBC: 6.6 10*3/uL (ref 4.0–10.5)
nRBC: 0 % (ref 0.0–0.2)

## 2019-04-22 LAB — AMMONIA: Ammonia: 30 umol/L (ref 9–35)

## 2019-04-22 LAB — COMPREHENSIVE METABOLIC PANEL
ALT: 48 U/L — ABNORMAL HIGH (ref 0–44)
AST: 60 U/L — ABNORMAL HIGH (ref 15–41)
Albumin: 2.8 g/dL — ABNORMAL LOW (ref 3.5–5.0)
Alkaline Phosphatase: 533 U/L — ABNORMAL HIGH (ref 38–126)
Anion gap: 13 (ref 5–15)
BUN: 16 mg/dL (ref 8–23)
CO2: 29 mmol/L (ref 22–32)
Calcium: 9.9 mg/dL (ref 8.9–10.3)
Chloride: 91 mmol/L — ABNORMAL LOW (ref 98–111)
Creatinine, Ser: 1.42 mg/dL — ABNORMAL HIGH (ref 0.44–1.00)
GFR calc Af Amer: 41 mL/min — ABNORMAL LOW (ref >60)
GFR calc non Af Amer: 36 mL/min — ABNORMAL LOW (ref >60)
Glucose, Bld: 207 mg/dL — ABNORMAL HIGH (ref 70–99)
Potassium: 4.3 mmol/L (ref 3.5–5.1)
Sodium: 133 mmol/L — ABNORMAL LOW (ref 135–145)
Total Bilirubin: 1.7 mg/dL — ABNORMAL HIGH (ref 0.3–1.2)
Total Protein: 6.4 g/dL — ABNORMAL LOW (ref 6.5–8.1)

## 2019-04-22 LAB — LACTIC ACID, PLASMA: Lactic Acid, Venous: 1.3 mmol/L (ref 0.5–1.9)

## 2019-04-22 LAB — APTT: aPTT: 30 seconds (ref 24–36)

## 2019-04-22 LAB — PROTIME-INR
INR: 1.1 (ref 0.8–1.2)
Prothrombin Time: 13.9 seconds (ref 11.4–15.2)

## 2019-04-22 MED ORDER — LACTATED RINGERS IV BOLUS
1000.0000 mL | Freq: Once | INTRAVENOUS | Status: AC
  Administered 2019-04-22: 1000 mL via INTRAVENOUS

## 2019-04-22 MED ORDER — SODIUM CHLORIDE 0.9 % IV SOLN
2.0000 g | Freq: Once | INTRAVENOUS | Status: AC
  Administered 2019-04-22: 2 g via INTRAVENOUS
  Filled 2019-04-22: qty 20

## 2019-04-22 MED ORDER — VANCOMYCIN HCL IN DEXTROSE 1-5 GM/200ML-% IV SOLN
1000.0000 mg | Freq: Once | INTRAVENOUS | Status: AC
  Administered 2019-04-22: 1000 mg via INTRAVENOUS
  Filled 2019-04-22: qty 200

## 2019-04-22 NOTE — ED Triage Notes (Signed)
Pt went to the doctor office today, diagnosed with cellulitis of right eye, was given a prescription,nursing home is not aware of if she has taken it. EMS also reports new onset of a-fib w/ rvr. NH reports lethargy and confusion, pt answering questions appropriately at this time. Pt also complaining of generalized pain.

## 2019-04-22 NOTE — ED Provider Notes (Signed)
[facility] EMERGENCY DEPARTMENT Provider Note   CSN: [id number] Arrival date & time: 04/22/19  2003     History Chief Complaint  Patient presents with  . Cellulitis  . Elbow Pain    Ashley Caldwell is a 79 y.o. female.  HPI   Patient presents to the ED for evaluation of lethargy and confusion from her nursing home.  Patient started having issues with facial swelling and a rash around her eye today.  She went to see an eye doctor.  Patient was diagnosed with cellulitis.  It is unclear if the patient started taking her medications.  According to staff at facility the patient looks more listless today.  They sent her in for further evaluation.  Patient does complain of pain around her face.  She denies any fevers or chills.  No vomiting or diarrhea.  She does have a headache.  Past Medical History:  Diagnosis Date  . Arthritis   . Asthma   . CFS (chronic fatigue syndrome)   . Compression fracture of vertebral column ([place]) 01/16/2011   per CXR  . DDD (degenerative disc disease), lumbar 01/18/2016  . Diabetes mellitus   . Elevated cholesterol 01/18/2016  . Fibromyalgia 01/18/2016  . Fibromyositis   . Hashimoto's thyroiditis   . HTN (hypertension), benign   . Hypoglycemia associated with diabetes ([place]) 01/16/2011  . Morbid obesity ([place])   . Osteoarthritis of both knees 01/18/2016  . Pneumonia 12/2010  . Psoriasis 01/18/2016  . Psoriatic arthritis ([place])   . Thyroid disease     Patient Active Problem List   Diagnosis Date Noted  . High risk medication use 06/12/2016  . Closed fracture of right proximal humerus 05/01/2016  . History of renal failure 04/26/2016  . Primary osteoarthritis of both knees 04/26/2016  . Renal failure 04/13/2016  . Acute renal failure ([place]) 04/13/2016  . Psoriasis 01/18/2016  . Fibromyalgia 01/18/2016  . DDD (degenerative disc disease), lumbar 01/18/2016  . Osteoarthritis of both knees 01/18/2016  . Elevated cholesterol 01/18/2016   . Hypoglycemia associated with diabetes ([place]) 01/16/2011  . Compression fracture of vertebral column ([place]) 01/16/2011  . Hyponatremia 01/14/2011  . Pneumonia 01/13/2011  . Hyperglycemia 01/13/2011  . Weakness generalized 01/13/2011  . DM type 2 (diabetes mellitus, type 2) ([place]) 01/13/2011  . HTN (hypertension), benign 01/13/2011  . Psoriatic arthritis ([place]) 01/13/2011  . Anemia 01/13/2011  . Thyroiditis, chronic 01/13/2011  . Morbid obesity ([place]) 01/13/2011  . Asthma 01/13/2011    Past Surgical History:  Procedure Laterality Date  . ABDOMINAL HYSTERECTOMY    . ABDOMINAL SURGERY     lapband  . CATARACT EXTRACTION    . KNEE SURGERY       OB History    Gravida      Para      Term      Preterm      AB      Living  2     SAB      TAB      Ectopic      Multiple      Live Births              Family History  Problem Relation Age of Onset  . Diabetes Father   . Hypertension Mother   . Stroke Mother   . Cancer Brother   . Cancer Brother     Social History   Tobacco Use  . Smoking status: Former Smoker    Years:  20.00    Types: Cigarettes    Quit date: 01/19/1991    Years since quitting: 28.2  . Smokeless tobacco: Never Used  Substance Use Topics  . Alcohol use: No  . Drug use: No    Home Medications Prior to Admission medications   Medication Sig Start Date End Date Taking? Authorizing Provider  Acetaminophen (TYLENOL PO) Take by mouth as needed.    [provider]  ACIDOPHILUS LACTOBACILLUS PO Take by mouth.    [provider]  ADVAIR DISKUS 250-50 MCG/DOSE AEPB Inhale 1 puff into the lungs 2 (two) times daily. 04/04/16   [provider]  albuterol (PROVENTIL HFA;VENTOLIN HFA) 108 (90 BASE) MCG/ACT inhaler Inhale 2 puffs into the lungs every 4 (four) hours as needed for wheezing or shortness of breath. 01/16/11 05/21/17  Rexene Alberts, MD  baclofen (LIORESAL) 10 MG tablet Take 5 mg by mouth at bedtime.    [provider]  buprenorphine (BUTRANS) 10 MCG/HR PTWK patch Place 1 patch (10 mcg total) onto the skin once a week. 04/27/16   Carole Civil, MD  cetirizine (ZYRTEC) 10 MG tablet Take 10 mg by mouth daily.      [provider]  clindamycin (CLEOCIN) 300 MG capsule Take 300 mg by mouth 3 (three) times daily.  06/10/16   [provider]  cyclobenzaprine (FLEXERIL) 5 MG tablet Take 1 tablet (5 mg total) by mouth at bedtime. 04/16/16   Thurnell Lose, MD  DULoxetine (CYMBALTA) 60 MG capsule Take 1 capsule (60 mg total) by mouth 2 (two) times daily. 01/19/16 05/21/17  Panwala, Naitik, PA-C  fentaNYL (DURAGESIC - DOSED MCG/HR) 50 MCG/HR daily. 02/14/17   [provider]  fluticasone furoate-vilanterol (BREO ELLIPTA) 100-25 MCG/INH AEPB Inhale 1 puff into the lungs daily.    [provider]  folic acid (FOLVITE) 1 MG tablet Take 1 mg by mouth daily.    [provider]  furosemide (LASIX) 20 MG tablet Take 1 tablet (20 mg total) by mouth daily as needed for fluid. Patient taking differently: Take 30 mg by mouth daily as needed for fluid.  04/16/16   Thurnell Lose, MD  guaiFENesin (ROBITUSSIN) 100 MG/5ML liquid Take 200 mg by mouth 3 (three) times daily as needed for cough.    [provider]  insulin aspart (NOVOLOG) 100 UNIT/ML injection Before each meal 3 times a day, 140-199 - 2 units, 200-250 - 4 units, 251-299 - 6 units,  300-349 - 8 units,  350 or above 10 units. Dispense syringes and needles as needed, Ok to switch to PEN if approved. Substitute to any brand approved. DX DM2, Code E11.65 Patient not taking: Reported on 06/13/2016 04/16/16   Thurnell Lose, MD  insulin aspart (NOVOLOG) 100 UNIT/ML injection Before each meal 3 times a day, 140-199 - 2 units, 200-250 - 4 units, 251-299 - 6 units,  300-349 - 8 units,  350 or above 10 units. Dispense syringes and needles as needed, Ok to switch to PEN if approved. Substitute to any brand approved.  DX DM2, Code E11.65 04/16/16   Thurnell Lose, MD  insulin glargine (LANTUS) 100 UNIT/ML injection Inject 0.35 mLs (35 Units total) into the skin 2 (two) times daily. 04/16/16   Thurnell Lose, MD  insulin lispro (HUMALOG) 100 UNIT/ML injection Inject 0.03 mLs (3 Units total) into the skin 3 (three) times daily before meals. Plus the sliding scale with meals only 04/16/16   Thurnell Lose, MD  lamoTRIgine (LAMICTAL) 25 MG tablet Take 25 mg by mouth 2 (two) times daily.    [provider]  levothyroxine (SYNTHROID, LEVOTHROID) 137 MCG tablet Take 137 mcg by mouth daily.      [provider]  lisinopril (PRINIVIL,ZESTRIL) 5 MG tablet Take 5 mg by mouth daily.    [provider]  Menthol, Topical Analgesic, (BIOFREEZE EX) Apply topically.    [provider]  metFORMIN (GLUCOPHAGE) 500 MG tablet Take 500 mg by mouth daily.    [provider]  methotrexate (RHEUMATREX) 2.5 MG tablet TAKE 8 TABLETS EACH WEEK. Patient not taking: Reported on 05/21/2017 03/07/16   Bo Merino, MD  montelukast (SINGULAIR) 10 MG tablet Take 10 mg by mouth daily.      [provider]  nystatin (MYCOSTATIN/NYSTOP) powder Apply 1 g topically daily as needed (yeast infection).  01/05/16   [provider]  oxyCODONE (OXY IR/ROXICODONE) 5 MG immediate release tablet Take 1 tablet (5 mg total) by mouth 4 (four) times daily. Patient taking differently: Take 10 mg by mouth 4 (four) times daily.  04/16/16   Thurnell Lose, MD  sertraline (ZOLOFT) 25 MG tablet Take 25 mg by mouth daily.    [provider]  sertraline (ZOLOFT) 50 MG tablet Take 50 mg by mouth daily.    [provider]  silver sulfADIAZINE (SILVADENE) 1 % cream Apply 1 application topically daily.    [provider]  triamcinolone cream (KENALOG) 0.1 % Apply 1 application topically daily.    [provider]  zolpidem (AMBIEN) 10 MG tablet Take 10 mg by mouth at  bedtime.    [provider]  etanercept (ENBREL) 50 MG/ML injection Inject 50 mg into the skin once a week. Give on Saturday!! Patient has not given injection in 2 weeks!!!  01/19/16  [provider]    Allergies    Amoxicillin-pot clavulanate, Lyrica [pregabalin], and Latex  Review of Systems   Review of Systems  All other systems reviewed and are negative.   Physical Exam Updated Vital Signs BP 106/76   Pulse 88   Temp 97.9 F (36.6 C) (Oral)   Resp 18   SpO2 99%   Physical Exam Vitals and nursing note reviewed.  Constitutional:      Appearance: She is well-developed. She is ill-appearing.     Comments: Listless   HENT:     Head: Normocephalic.     Comments: Rash right side of face involving forehead, periorbital region, cheek    Right Ear: External ear normal.     Left Ear: External ear normal.  Eyes:     General: No scleral icterus.       Right eye: No discharge.        Left eye: No discharge.     Conjunctiva/sclera: Conjunctivae normal.  Neck:     Trachea: No tracheal deviation.  Cardiovascular:     Rate and Rhythm: Normal rate and regular rhythm.  Pulmonary:     Effort: Pulmonary effort is normal. No respiratory distress.     Breath sounds: Normal breath sounds. No stridor. No wheezing or rales.  Abdominal:     General: Bowel sounds are normal. There is no distension.     Palpations: Abdomen is soft.     Tenderness: There is no abdominal tenderness. There is no guarding or rebound.  Musculoskeletal:        General: No tenderness.     Cervical back: Neck supple.  Skin:  General: Skin is warm and dry.     Findings: No rash.  Neurological:     Cranial Nerves: No cranial nerve deficit (no facial droop, extraocular movements intact, no slurred speech).     Sensory: No sensory deficit.     Motor: No abnormal muscle tone or seizure activity.     Coordination: Coordination normal.     Comments: Generalized weakness, able to move all  extremities but difficulty lifting them off the bed, aware that she is in the hospital, able to provide history,      ED Results / Procedures / Treatments   Labs (all labs ordered are listed, but only abnormal results are displayed) Labs Reviewed  COMPREHENSIVE METABOLIC PANEL - Abnormal; Notable for the following components:      Result Value   Sodium 133 (*)    Chloride 91 (*)    Glucose, Bld 207 (*)    Creatinine, Ser 1.42 (*)    Total Protein 6.4 (*)    Albumin 2.8 (*)    AST 60 (*)    ALT 48 (*)    Alkaline Phosphatase 533 (*)    Total Bilirubin 1.7 (*)    GFR calc non Af Amer 36 (*)    GFR calc Af Amer 41 (*)    All other components within normal limits  CULTURE, BLOOD (ROUTINE X 2)  CULTURE, BLOOD (ROUTINE X 2)  URINE CULTURE  LACTIC ACID, PLASMA  CBC WITH DIFFERENTIAL/PLATELET  APTT  PROTIME-INR  AMMONIA  LACTIC ACID, PLASMA  URINALYSIS, ROUTINE W REFLEX MICROSCOPIC    EKG EKG Interpretation  Date/Time:  Wednesday April 22 2019 22:08:59 EST Ventricular Rate:  97 PR Interval:    QRS Duration: 151 QT Interval:  398 QTC Calculation: 506 R Axis:   121 Text Interpretation: Atrial fibrillation RBBB and LPFB atrial fibrillation is new since last tracing Confirmed by Dorie Rank [phone]) on 04/22/2019 10:16:11 PM   Radiology CT Head Wo Contrast  Result Date: 04/22/2019 CLINICAL DATA:  Mental status change EXAM: CT HEAD WITHOUT CONTRAST TECHNIQUE: Contiguous axial images were obtained from the base of the skull through the vertex without intravenous contrast. COMPARISON:  None. FINDINGS: Brain: There is no acute intracranial hemorrhage, mass effect, or edema. Gray-white differentiation is preserved. Patchy hypoattenuation in the supratentorial white matter is nonspecific may reflect mild chronic microvascular ischemic changes. Prominence of the ventricles and sulci reflects minor generalized parenchymal volume loss. There is no extra-axial fluid collection. Vascular:  There is intracranial atherosclerotic calcification at the skull base. Skull: Unremarkable. Sinuses/Orbits: Right periorbital soft tissue swelling. Right frontal sinus partial opacification. Other: Mastoid air cells are clear. IMPRESSION: No acute intracranial hemorrhage, mass effect, or evidence of acute infarction. Chronic microvascular ischemic changes. Right periorbital soft tissue swelling. Electronically Signed   By: Macy Mis M.D.   On: 04/22/2019 21:53   DG Chest Port 1 View  Result Date: 04/22/2019 CLINICAL DATA:  79 year old female with weakness. EXAM: PORTABLE CHEST 1 VIEW COMPARISON:  Chest radiograph dated 04/13/2016. FINDINGS: There is cardiomegaly with vascular congestion. No focal consolidation, pleural effusion, pneumothorax. There is diffuse interstitial coarsening, chronic. No acute osseous pathology. IMPRESSION: 1. Cardiomegaly with mild vascular congestion. No focal consolidation. 2. Chronic interstitial coarsening and bronchitic changes. Electronically Signed   By: Anner Crete M.D.   On: 04/22/2019 21:25    Procedures Procedures (including critical care time)  Medications Ordered in ED Medications  vancomycin (VANCOCIN) IVPB 1000 mg/200 mL premix (0 mg Intravenous Stopped 04/22/19 2303)  cefTRIAXone (ROCEPHIN) 2 g in sodium chloride 0.9 % 100 mL IVPB (0 g Intravenous Stopped 04/22/19 2143)  lactated ringers bolus 1,000 mL (1,000 mLs Intravenous New Bag/Given 04/22/19 2305)    ED Course  I have reviewed the triage vital signs and the nursing notes.  Pertinent labs & imaging results that were available during my care of the patient were reviewed by me and considered in my medical decision making (see chart for details).  Clinical Course as of Apr 21 2336  Wed Apr 22, 2019  2213 Labs show elevation of her bilirubin as well as AST ALT and alk phos   [JK]  2214 CT scan without acute findings   [JK]  2214 Chest x-ray without acute findings.   [JK]  2221 Pt  re-examined.  She does have some mild ttp in the ruq.  Pt does seem more alert.  Will US abdomen   [JK]  2333 US Abdomen Complete [JK]    Clinical Course User Index [JK] Dorie Rank, MD   MDM Rules/Calculators/A&P                      Pt presented with lethargy, recent diagnosis of orbital cellulitis.  ED workup notable for abnormal LFTS.  Exam notable for significant periorbital cellulitis.  Afebrile, no signs of sepsis.  EKG shows a fib.  IV abx started for cellulitis.  Abd [place] ordered.  Anticipate admission for IV abx but will assess [place] to rule out acute surgical process.   Final Clinical Impression(s) / ED Diagnoses Final diagnoses:  Periorbital cellulitis of right eye  Atrial fibrillation, unspecified type ([place])  Elevated LFTs      Dorie Rank, MD 04/22/19 2341

## 2019-04-22 NOTE — ED Notes (Signed)
Ashley Caldwell daughter [phone] call with any updates

## 2019-04-22 NOTE — ED Notes (Signed)
Pt to CT scan.

## 2019-04-23 ENCOUNTER — Encounter (HOSPITAL_COMMUNITY): Payer: Self-pay | Admitting: Internal Medicine

## 2019-04-23 ENCOUNTER — Inpatient Hospital Stay (HOSPITAL_COMMUNITY): Payer: Medicare HMO

## 2019-04-23 DIAGNOSIS — L4050 Arthropathic psoriasis, unspecified: Secondary | ICD-10-CM | POA: Diagnosis present

## 2019-04-23 DIAGNOSIS — T402X5A Adverse effect of other opioids, initial encounter: Secondary | ICD-10-CM

## 2019-04-23 DIAGNOSIS — I4891 Unspecified atrial fibrillation: Secondary | ICD-10-CM | POA: Diagnosis not present

## 2019-04-23 DIAGNOSIS — B0230 Zoster ocular disease, unspecified: Secondary | ICD-10-CM

## 2019-04-23 DIAGNOSIS — I10 Essential (primary) hypertension: Secondary | ICD-10-CM | POA: Diagnosis present

## 2019-04-23 DIAGNOSIS — L03213 Periorbital cellulitis: Principal | ICD-10-CM

## 2019-04-23 DIAGNOSIS — Z888 Allergy status to other drugs, medicaments and biological substances status: Secondary | ICD-10-CM | POA: Diagnosis not present

## 2019-04-23 DIAGNOSIS — E1100 Type 2 diabetes mellitus with hyperosmolarity without nonketotic hyperglycemic-hyperosmolar coma (NKHHC): Secondary | ICD-10-CM

## 2019-04-23 DIAGNOSIS — Z823 Family history of stroke: Secondary | ICD-10-CM | POA: Diagnosis not present

## 2019-04-23 DIAGNOSIS — R7989 Other specified abnormal findings of blood chemistry: Secondary | ICD-10-CM | POA: Diagnosis present

## 2019-04-23 DIAGNOSIS — K5903 Drug induced constipation: Secondary | ICD-10-CM

## 2019-04-23 DIAGNOSIS — F1120 Opioid dependence, uncomplicated: Secondary | ICD-10-CM | POA: Diagnosis present

## 2019-04-23 DIAGNOSIS — Z79890 Hormone replacement therapy (postmenopausal): Secondary | ICD-10-CM | POA: Diagnosis not present

## 2019-04-23 DIAGNOSIS — M170 Bilateral primary osteoarthritis of knee: Secondary | ICD-10-CM | POA: Diagnosis present

## 2019-04-23 DIAGNOSIS — Z91040 Latex allergy status: Secondary | ICD-10-CM | POA: Diagnosis not present

## 2019-04-23 DIAGNOSIS — E0580 Other thyrotoxicosis without thyrotoxic crisis or storm: Secondary | ICD-10-CM

## 2019-04-23 DIAGNOSIS — G894 Chronic pain syndrome: Secondary | ICD-10-CM | POA: Diagnosis not present

## 2019-04-23 DIAGNOSIS — Z833 Family history of diabetes mellitus: Secondary | ICD-10-CM | POA: Diagnosis not present

## 2019-04-23 DIAGNOSIS — Z809 Family history of malignant neoplasm, unspecified: Secondary | ICD-10-CM | POA: Diagnosis not present

## 2019-04-23 DIAGNOSIS — Z880 Allergy status to penicillin: Secondary | ICD-10-CM | POA: Diagnosis not present

## 2019-04-23 DIAGNOSIS — Z66 Do not resuscitate: Secondary | ICD-10-CM | POA: Diagnosis present

## 2019-04-23 DIAGNOSIS — Z8616 Personal history of COVID-19: Secondary | ICD-10-CM | POA: Diagnosis not present

## 2019-04-23 DIAGNOSIS — E7800 Pure hypercholesterolemia, unspecified: Secondary | ICD-10-CM | POA: Diagnosis present

## 2019-04-23 DIAGNOSIS — Z7951 Long term (current) use of inhaled steroids: Secondary | ICD-10-CM | POA: Diagnosis not present

## 2019-04-23 DIAGNOSIS — M797 Fibromyalgia: Secondary | ICD-10-CM | POA: Diagnosis present

## 2019-04-23 DIAGNOSIS — Z8249 Family history of ischemic heart disease and other diseases of the circulatory system: Secondary | ICD-10-CM | POA: Diagnosis not present

## 2019-04-23 DIAGNOSIS — M5136 Other intervertebral disc degeneration, lumbar region: Secondary | ICD-10-CM | POA: Diagnosis present

## 2019-04-23 DIAGNOSIS — J45909 Unspecified asthma, uncomplicated: Secondary | ICD-10-CM | POA: Diagnosis present

## 2019-04-23 DIAGNOSIS — Z87891 Personal history of nicotine dependence: Secondary | ICD-10-CM | POA: Diagnosis not present

## 2019-04-23 DIAGNOSIS — E11649 Type 2 diabetes mellitus with hypoglycemia without coma: Secondary | ICD-10-CM | POA: Diagnosis not present

## 2019-04-23 DIAGNOSIS — Z794 Long term (current) use of insulin: Secondary | ICD-10-CM | POA: Diagnosis not present

## 2019-04-23 LAB — BASIC METABOLIC PANEL
Anion gap: 13 (ref 5–15)
BUN: 14 mg/dL (ref 8–23)
CO2: 30 mmol/L (ref 22–32)
Calcium: 9.6 mg/dL (ref 8.9–10.3)
Chloride: 91 mmol/L — ABNORMAL LOW (ref 98–111)
Creatinine, Ser: 1.3 mg/dL — ABNORMAL HIGH (ref 0.44–1.00)
GFR calc Af Amer: 46 mL/min — ABNORMAL LOW (ref >60)
GFR calc non Af Amer: 40 mL/min — ABNORMAL LOW (ref >60)
Glucose, Bld: 219 mg/dL — ABNORMAL HIGH (ref 70–99)
Potassium: 3.9 mmol/L (ref 3.5–5.1)
Sodium: 134 mmol/L — ABNORMAL LOW (ref 135–145)

## 2019-04-23 LAB — GLUCOSE, CAPILLARY
Glucose-Capillary: 183 mg/dL — ABNORMAL HIGH (ref 70–99)
Glucose-Capillary: 203 mg/dL — ABNORMAL HIGH (ref 70–99)
Glucose-Capillary: 156 mg/dL — ABNORMAL HIGH (ref 70–99)
Glucose-Capillary: 67 mg/dL — ABNORMAL LOW (ref 70–99)
Glucose-Capillary: 83 mg/dL (ref 70–99)
Glucose-Capillary: 122 mg/dL — ABNORMAL HIGH (ref 70–99)

## 2019-04-23 LAB — CBC
HCT: 40.9 % (ref 36.0–46.0)
Hemoglobin: 13.1 g/dL (ref 12.0–15.0)
MCH: 29.1 pg (ref 26.0–34.0)
MCHC: 32 g/dL (ref 30.0–36.0)
MCV: 90.9 fL (ref 80.0–100.0)
Platelets: 294 10*3/uL (ref 150–400)
RBC: 4.5 MIL/uL (ref 3.87–5.11)
RDW: 15.2 % (ref 11.5–15.5)
WBC: 6.5 10*3/uL (ref 4.0–10.5)
nRBC: 0 % (ref 0.0–0.2)

## 2019-04-23 LAB — CBC WITH DIFFERENTIAL/PLATELET
Abs Immature Granulocytes: 0.05 10*3/uL (ref 0.00–0.07)
Basophils Absolute: 0.1 10*3/uL (ref 0.0–0.1)
Basophils Relative: 1 %
Eosinophils Absolute: 0.1 10*3/uL (ref 0.0–0.5)
Eosinophils Relative: 2 %
HCT: 40.8 % (ref 36.0–46.0)
Hemoglobin: 13.2 g/dL (ref 12.0–15.0)
Immature Granulocytes: 1 %
Lymphocytes Relative: 23 %
Lymphs Abs: 1.4 10*3/uL (ref 0.7–4.0)
MCH: 29.2 pg (ref 26.0–34.0)
MCHC: 32.4 g/dL (ref 30.0–36.0)
MCV: 90.3 fL (ref 80.0–100.0)
Monocytes Absolute: 0.9 10*3/uL (ref 0.1–1.0)
Monocytes Relative: 15 %
Neutro Abs: 3.7 10*3/uL (ref 1.7–7.7)
Neutrophils Relative %: 58 %
Platelets: 313 10*3/uL (ref 150–400)
RBC: 4.52 MIL/uL (ref 3.87–5.11)
RDW: 15.2 % (ref 11.5–15.5)
WBC: 6.3 10*3/uL (ref 4.0–10.5)
nRBC: 0 % (ref 0.0–0.2)

## 2019-04-23 LAB — URINALYSIS, ROUTINE W REFLEX MICROSCOPIC
Bilirubin Urine: NEGATIVE
Glucose, UA: NEGATIVE mg/dL
Ketones, ur: 5 mg/dL — AB
Nitrite: NEGATIVE
Protein, ur: 30 mg/dL — AB
Specific Gravity, Urine: 1.024 (ref 1.005–1.030)
pH: 5 (ref 5.0–8.0)

## 2019-04-23 LAB — TSH: TSH: 0.09 u[IU]/mL — ABNORMAL LOW (ref 0.350–4.500)

## 2019-04-23 LAB — SARS CORONAVIRUS 2 (TAT 6-24 HRS): SARS Coronavirus 2: POSITIVE — AB

## 2019-04-23 LAB — HEPATIC FUNCTION PANEL
ALT: 40 U/L (ref 0–44)
AST: 43 U/L — ABNORMAL HIGH (ref 15–41)
Albumin: 2.5 g/dL — ABNORMAL LOW (ref 3.5–5.0)
Alkaline Phosphatase: 480 U/L — ABNORMAL HIGH (ref 38–126)
Bilirubin, Direct: 0.5 mg/dL — ABNORMAL HIGH (ref 0.0–0.2)
Indirect Bilirubin: 0.7 mg/dL (ref 0.3–0.9)
Total Bilirubin: 1.2 mg/dL (ref 0.3–1.2)
Total Protein: 5.8 g/dL — ABNORMAL LOW (ref 6.5–8.1)

## 2019-04-23 LAB — HEPATITIS PANEL, ACUTE
HCV Ab: NONREACTIVE
Hep A IgM: NONREACTIVE
Hep B C IgM: NONREACTIVE
Hepatitis B Surface Ag: NONREACTIVE

## 2019-04-23 LAB — ECHOCARDIOGRAM COMPLETE
Height: 69 in
Weight: 4567.93 oz

## 2019-04-23 LAB — TROPONIN I (HIGH SENSITIVITY)
Troponin I (High Sensitivity): 19 ng/L — ABNORMAL HIGH (ref <18)
Troponin I (High Sensitivity): 19 ng/L — ABNORMAL HIGH (ref <18)

## 2019-04-23 LAB — CREATININE, SERUM
Creatinine, Ser: 1.34 mg/dL — ABNORMAL HIGH (ref 0.44–1.00)
GFR calc Af Amer: 44 mL/min — ABNORMAL LOW (ref >60)
GFR calc non Af Amer: 38 mL/min — ABNORMAL LOW (ref >60)

## 2019-04-23 MED ORDER — ERYTHROMYCIN 5 MG/GM OP OINT
TOPICAL_OINTMENT | Freq: Three times a day (TID) | OPHTHALMIC | Status: DC
  Administered 2019-04-23 – 2019-04-27 (×2): 1 via OPHTHALMIC
  Filled 2019-04-23 (×2): qty 3.5

## 2019-04-23 MED ORDER — METOPROLOL TARTRATE 25 MG PO TABS
12.5000 mg | ORAL_TABLET | Freq: Two times a day (BID) | ORAL | Status: DC
  Administered 2019-04-23 (×2): 12.5 mg via ORAL
  Filled 2019-04-23 (×2): qty 1

## 2019-04-23 MED ORDER — VANCOMYCIN HCL 1500 MG/300ML IV SOLN: 1500.0000 mg | INTRAVENOUS | Status: DC

## 2019-04-23 MED ORDER — INSULIN DETEMIR 100 UNIT/ML ~~LOC~~ SOLN
20.0000 [IU] | Freq: Two times a day (BID) | SUBCUTANEOUS | Status: DC
  Administered 2019-04-23 – 2019-04-25 (×5): 20 [IU] via SUBCUTANEOUS
  Filled 2019-04-23 (×8): qty 0.2

## 2019-04-23 MED ORDER — DULOXETINE HCL 60 MG PO CPEP
120.0000 mg | ORAL_CAPSULE | Freq: Every day | ORAL | Status: DC
  Administered 2019-04-23 – 2019-04-27 (×5): 120 mg via ORAL
  Filled 2019-04-23 (×5): qty 2

## 2019-04-23 MED ORDER — VANCOMYCIN HCL IN DEXTROSE 1-5 GM/200ML-% IV SOLN
1000.0000 mg | Freq: Once | INTRAVENOUS | Status: AC
  Administered 2019-04-23: 1000 mg via INTRAVENOUS
  Filled 2019-04-23: qty 200

## 2019-04-23 MED ORDER — POLYETHYLENE GLYCOL 3350 17 G PO PACK
17.0000 g | PACK | ORAL | Status: DC
  Administered 2019-04-23 – 2019-04-26 (×2): 17 g via ORAL
  Filled 2019-04-23 (×2): qty 1

## 2019-04-23 MED ORDER — LISINOPRIL 5 MG PO TABS
5.0000 mg | ORAL_TABLET | Freq: Every day | ORAL | Status: DC
  Administered 2019-04-23: 5 mg via ORAL
  Filled 2019-04-23: qty 1

## 2019-04-23 MED ORDER — LEVOTHYROXINE SODIUM 100 MCG PO TABS
100.0000 ug | ORAL_TABLET | Freq: Every day | ORAL | Status: DC
  Administered 2019-04-24 – 2019-04-27 (×4): 100 ug via ORAL
  Filled 2019-04-23 (×4): qty 1

## 2019-04-23 MED ORDER — ACETAMINOPHEN 325 MG PO TABS
650.0000 mg | ORAL_TABLET | Freq: Four times a day (QID) | ORAL | Status: DC | PRN
  Administered 2019-04-23 – 2019-04-26 (×3): 650 mg via ORAL
  Filled 2019-04-23 (×3): qty 2

## 2019-04-23 MED ORDER — LAMOTRIGINE 25 MG PO TABS
25.0000 mg | ORAL_TABLET | Freq: Every day | ORAL | Status: DC
  Administered 2019-04-24 – 2019-04-27 (×4): 25 mg via ORAL
  Filled 2019-04-23 (×4): qty 1

## 2019-04-23 MED ORDER — MOMETASONE FURO-FORMOTEROL FUM 200-5 MCG/ACT IN AERO
2.0000 | INHALATION_SPRAY | Freq: Two times a day (BID) | RESPIRATORY_TRACT | Status: DC
  Filled 2019-04-23: qty 8.8

## 2019-04-23 MED ORDER — HEPARIN BOLUS VIA INFUSION
4000.0000 [IU] | Freq: Once | INTRAVENOUS | Status: AC
  Administered 2019-04-23: 4000 [IU] via INTRAVENOUS
  Filled 2019-04-23: qty 4000

## 2019-04-23 MED ORDER — ONDANSETRON HCL 4 MG PO TABS: 4.0000 mg | ORAL_TABLET | Freq: Four times a day (QID) | ORAL | Status: DC | PRN

## 2019-04-23 MED ORDER — ENOXAPARIN SODIUM 40 MG/0.4ML ~~LOC~~ SOLN
40.0000 mg | SUBCUTANEOUS | Status: DC
  Administered 2019-04-23: 40 mg via SUBCUTANEOUS
  Filled 2019-04-23: qty 0.4

## 2019-04-23 MED ORDER — VALACYCLOVIR HCL 500 MG PO TABS
1000.0000 mg | ORAL_TABLET | Freq: Three times a day (TID) | ORAL | Status: DC
  Filled 2019-04-23 (×2): qty 2

## 2019-04-23 MED ORDER — MONTELUKAST SODIUM 10 MG PO TABS
10.0000 mg | ORAL_TABLET | Freq: Every day | ORAL | Status: DC
  Administered 2019-04-23 – 2019-04-27 (×6): 10 mg via ORAL
  Filled 2019-04-23 (×6): qty 1

## 2019-04-23 MED ORDER — ACETAMINOPHEN 650 MG RE SUPP: 650.0000 mg | Freq: Four times a day (QID) | RECTAL | Status: DC | PRN

## 2019-04-23 MED ORDER — ONDANSETRON HCL 4 MG/2ML IJ SOLN: 4.0000 mg | Freq: Four times a day (QID) | INTRAMUSCULAR | Status: DC | PRN

## 2019-04-23 MED ORDER — LEVOTHYROXINE SODIUM 25 MCG PO TABS
137.0000 ug | ORAL_TABLET | Freq: Every day | ORAL | Status: DC
  Administered 2019-04-23: 137 ug via ORAL
  Filled 2019-04-23: qty 1

## 2019-04-23 MED ORDER — DEXTROSE 5 % IV SOLN
10.0000 mg/kg | Freq: Three times a day (TID) | INTRAVENOUS | Status: DC
  Filled 2019-04-23 (×2): qty 18.3

## 2019-04-23 MED ORDER — FENTANYL 50 MCG/HR TD PT72
1.0000 | MEDICATED_PATCH | TRANSDERMAL | Status: DC
  Administered 2019-04-23 – 2019-04-26 (×2): 1 via TRANSDERMAL
  Filled 2019-04-23 (×2): qty 1

## 2019-04-23 MED ORDER — ALBUTEROL SULFATE (2.5 MG/3ML) 0.083% IN NEBU: 2.5000 mg | INHALATION_SOLUTION | RESPIRATORY_TRACT | Status: DC | PRN

## 2019-04-23 MED ORDER — SODIUM CHLORIDE 0.9 % IV SOLN
2.0000 g | INTRAVENOUS | Status: DC
  Filled 2019-04-23: qty 20

## 2019-04-23 MED ORDER — HEPARIN (PORCINE) 25000 UT/250ML-% IV SOLN
1600.0000 [IU]/h | INTRAVENOUS | Status: DC
  Administered 2019-04-23: 1600 [IU]/h via INTRAVENOUS
  Filled 2019-04-23: qty 250

## 2019-04-23 MED ORDER — OXYCODONE HCL 5 MG PO TABS: 10.0000 mg | ORAL_TABLET | Freq: Two times a day (BID) | ORAL | Status: DC

## 2019-04-23 MED ORDER — MOMETASONE FURO-FORMOTEROL FUM 200-5 MCG/ACT IN AERO
2.0000 | INHALATION_SPRAY | Freq: Two times a day (BID) | RESPIRATORY_TRACT | Status: DC
  Administered 2019-04-26 – 2019-04-27 (×3): 2 via RESPIRATORY_TRACT
  Filled 2019-04-23: qty 8.8

## 2019-04-23 MED ORDER — APIXABAN 5 MG PO TABS
5.0000 mg | ORAL_TABLET | Freq: Two times a day (BID) | ORAL | Status: DC
  Administered 2019-04-23 – 2019-04-27 (×9): 5 mg via ORAL
  Filled 2019-04-23 (×9): qty 1

## 2019-04-23 MED ORDER — OXYCODONE HCL 5 MG PO TABS
10.0000 mg | ORAL_TABLET | ORAL | Status: DC | PRN
  Administered 2019-04-23 – 2019-04-27 (×15): 10 mg via ORAL
  Filled 2019-04-23 (×15): qty 2

## 2019-04-23 MED ORDER — ROPINIROLE HCL 0.5 MG PO TABS
0.5000 mg | ORAL_TABLET | Freq: Every day | ORAL | Status: DC
  Administered 2019-04-24 – 2019-04-26 (×3): 0.5 mg via ORAL
  Filled 2019-04-23 (×3): qty 1

## 2019-04-23 MED ORDER — FENTANYL CITRATE (PF) 100 MCG/2ML IJ SOLN
25.0000 ug | Freq: Once | INTRAMUSCULAR | Status: AC
  Administered 2019-04-23: 25 ug via INTRAVENOUS
  Filled 2019-04-23: qty 2

## 2019-04-23 MED ORDER — SENNA 8.6 MG PO TABS
1.0000 | ORAL_TABLET | Freq: Every day | ORAL | Status: DC
  Administered 2019-04-23 – 2019-04-27 (×6): 8.6 mg via ORAL
  Filled 2019-04-23 (×6): qty 1

## 2019-04-23 MED ORDER — INSULIN ASPART 100 UNIT/ML ~~LOC~~ SOLN
0.0000 [IU] | Freq: Three times a day (TID) | SUBCUTANEOUS | Status: DC
  Administered 2019-04-23: 2 [IU] via SUBCUTANEOUS
  Administered 2019-04-23: 3 [IU] via SUBCUTANEOUS
  Administered 2019-04-24 – 2019-04-25 (×2): 1 [IU] via SUBCUTANEOUS
  Administered 2019-04-25: 9 [IU] via SUBCUTANEOUS
  Administered 2019-04-26: 3 [IU] via SUBCUTANEOUS
  Administered 2019-04-26: 9 [IU] via SUBCUTANEOUS
  Administered 2019-04-26: 3 [IU] via SUBCUTANEOUS
  Administered 2019-04-27: 2 [IU] via SUBCUTANEOUS
  Administered 2019-04-27: 1 [IU] via SUBCUTANEOUS

## 2019-04-23 MED ORDER — FUROSEMIDE 40 MG PO TABS
40.0000 mg | ORAL_TABLET | Freq: Every day | ORAL | Status: DC
  Administered 2019-04-24 – 2019-04-27 (×4): 40 mg via ORAL
  Filled 2019-04-23 (×4): qty 1

## 2019-04-23 MED ORDER — VALACYCLOVIR HCL 500 MG PO TABS
1000.0000 mg | ORAL_TABLET | Freq: Three times a day (TID) | ORAL | Status: DC
  Administered 2019-04-23 – 2019-04-24 (×5): 1000 mg via ORAL
  Filled 2019-04-23 (×6): qty 2

## 2019-04-23 MED ORDER — TRAZODONE HCL 100 MG PO TABS
100.0000 mg | ORAL_TABLET | Freq: Every day | ORAL | Status: DC
  Administered 2019-04-23 – 2019-04-26 (×4): 100 mg via ORAL
  Filled 2019-04-23 (×4): qty 1

## 2019-04-23 MED ORDER — OXYCODONE HCL 5 MG PO TABS: 10.0000 mg | ORAL_TABLET | Freq: Four times a day (QID) | ORAL | Status: DC | PRN

## 2019-04-23 NOTE — Progress Notes (Signed)
ANTICOAGULATION CONSULT NOTE - Initial Consult  Pharmacy Consult for heparin Indication: atrial fibrillation  Allergies  Allergen Reactions  . Amoxicillin-Pot Clavulanate Nausea And Vomiting  . Lyrica [Pregabalin]   . Latex Itching and Rash    Patient Measurements: Height: '5\' 9"'$  (175.3 cm) Weight: 285 lb 7.9 oz (129.5 kg) IBW/kg (Calculated) : 66.2 Heparin Dosing Weight: 100kg  Vital Signs: Temp: 98.6 F (37 C) (02/04 0513) Temp Source: Oral (02/04 0513) BP: 110/48 (02/04 0513) Pulse Rate: 72 (02/04 0513)  Labs: Recent Labs    04/22/19 2051 04/23/19 0214  HGB 13.8 13.1  HCT 43.6 40.9  PLT 298 294  APTT 30  --   LABPROT 13.9  --   INR 1.1  --   CREATININE 1.42* 1.34*    Estimated Creatinine Clearance: 51.6 mL/min (A) (by C-G formula based on SCr of 1.34 mg/dL (H)).   Medical History: Past Medical History:  Diagnosis Date  . Arthritis   . Asthma   . CFS (chronic fatigue syndrome)   . Compression fracture of vertebral column ([place]) 01/16/2011   per CXR  . DDD (degenerative disc disease), lumbar 01/18/2016  . Diabetes mellitus   . Elevated cholesterol 01/18/2016  . Fibromyalgia 01/18/2016  . Fibromyositis   . Hashimoto's thyroiditis   . HTN (hypertension), benign   . Hypoglycemia associated with diabetes ([place]) 01/16/2011  . Morbid obesity ([place])   . Osteoarthritis of both knees 01/18/2016  . Pneumonia 12/2010  . Psoriasis 01/18/2016  . Psoriatic arthritis ([place])   . Thyroid disease     Medications:  Medications Prior to Admission  Medication Sig Dispense Refill Last Dose  . acetaminophen (TYLENOL) 325 MG tablet Take 650 mg by mouth every 6 (six) hours as needed for mild pain or fever.   unknown  . albuterol (PROVENTIL HFA;VENTOLIN HFA) 108 (90 BASE) MCG/ACT inhaler Inhale 2 puffs into the lungs every 4 (four) hours as needed for wheezing or shortness of breath. 1 Inhaler 1 unknown  . benzonatate (TESSALON) 100 MG capsule Take 100 mg by mouth 4 (four) times  daily as needed for cough.   unknown  . cephALEXin (KEFLEX) 500 MG capsule Take 500 mg by mouth 2 (two) times daily.   04/22/2019 at Unknown time  . cetirizine (ZYRTEC) 10 MG tablet Take 10 mg by mouth daily at 2 PM.    04/22/2019 at Unknown time  . chlorhexidine (PERIDEX) 0.12 % solution Use as directed 15 mLs in the mouth or throat 2 (two) times daily.   04/22/2019 at Unknown time  . Cholecalciferol (VITAMIN D) 50 MCG (2000 UT) tablet Take 2,000 Units by mouth daily at 2 PM.   04/22/2019 at Unknown time  . COVID-19 mRNA vaccine, Moderna, (MODERNA COVID-19 VACCINE) 100 MCG/0.5ML SUSP Inject into the muscle once.   04/21/2019 at Unknown time  . Cranberry 450 MG TABS Take 450 mg by mouth daily.   04/22/2019 at Unknown time  . DULoxetine (CYMBALTA) 60 MG capsule Take 1 capsule (60 mg total) by mouth 2 (two) times daily. (Patient taking differently: Take 120 mg by mouth daily. ) 60 capsule 5 04/22/2019 at Unknown time  . fentaNYL (DURAGESIC - DOSED MCG/HR) 50 MCG/HR Place 1 patch onto the skin every 3 (three) days.    04/20/2019 at Unknown time  . fentaNYL (DURAGESIC) 12 MCG/HR Place 1 patch onto the skin every 3 (three) days.   04/20/2019 at Unknown time  . Fluticasone-Salmeterol (ADVAIR) 500-50 MCG/DOSE AEPB Inhale 1 puff into the lungs 2 (  two) times daily.   04/22/2019 at Unknown time  . furosemide (LASIX) 40 MG tablet Take 40 mg by mouth daily at 2 PM.   04/22/2019 at Unknown time  . Glucagon, rDNA, (GLUCAGON EMERGENCY) 1 MG KIT Inject 1 mg as directed every 6 (six) hours as needed (for blood suger <60).   unknown  . guaiFENesin (ROBITUSSIN) 100 MG/5ML liquid Take 200 mg by mouth every 4 (four) hours as needed for cough.    unknown  . insulin aspart (NOVOLOG) 100 UNIT/ML injection Before each meal 3 times a day, 140-199 - 2 units, 200-250 - 4 units, 251-299 - 6 units,  300-349 - 8 units,  350 or above 10 units. Dispense syringes and needles as needed, Ok to switch to PEN if approved. Substitute to any brand approved. DX  DM2, Code E11.65 (Patient taking differently: Inject 0-10 Units into the skin See admin instructions. IF Blood sugar is <60 or > 400 = Notify MD 0-200 = 0 units 200-249= 2 units 250-299= 4 units 300-349= 6 units 350-399= 8 units 400-449= 10 units) 1 vial 12 04/22/2019 at Unknown time  . insulin detemir (LEVEMIR) 100 UNIT/ML injection Inject 20-25 Units into the skin See admin instructions. Use 25 units every morning and use 20 units at bedtime   04/21/2019 at Unknown time  . ipratropium (ATROVENT) 0.02 % nebulizer solution Take 0.5 mg by nebulization every 4 (four) hours as needed for wheezing or shortness of breath.   unknown  . lamoTRIgine (LAMICTAL) 25 MG tablet Take 25 mg by mouth daily at 2 PM.    04/22/2019 at Unknown time  . levothyroxine (SYNTHROID, LEVOTHROID) 137 MCG tablet Take 137 mcg by mouth daily.     04/22/2019 at Unknown time  . Lidocaine (ASPERCREME LIDOCAINE) 4 % PTCH Apply 1 patch topically daily.   04/22/2019 at Unknown time  . lisinopril (PRINIVIL,ZESTRIL) 5 MG tablet Take 5 mg by mouth daily at 2 PM.    04/22/2019 at Unknown time  . metFORMIN (GLUCOPHAGE) 1000 MG tablet Take 1,000 mg by mouth daily at 2 PM.   04/22/2019 at Unknown time  . montelukast (SINGULAIR) 10 MG tablet Take 10 mg by mouth daily at 2 PM.    04/22/2019 at Unknown time  . Multiple Vitamin (MULTIVITAMIN WITH MINERALS) TABS tablet Take 1 tablet by mouth daily at 2 PM.   04/22/2019 at Unknown time  . ondansetron (ZOFRAN) 4 MG tablet Take 4 mg by mouth every 6 (six) hours as needed for nausea or vomiting.   unknown  . Oxycodone HCl 10 MG TABS Take 10 mg by mouth 2 (two) times daily.   04/22/2019 at Unknown time  . Oxycodone HCl 10 MG TABS Take 10 mg by mouth every 4 (four) hours as needed (for breakthrough pain).   04/21/2019 at Unknown time  . polyethylene glycol (MIRALAX / GLYCOLAX) 17 g packet Take 17 g by mouth every 3 (three) days.   04/22/2019 at Unknown time  . Propylene Glycol (SYSTANE BALANCE) 0.6 % SOLN Place 2 drops into  the right eye daily.   04/22/2019 at Unknown time  . rOPINIRole (REQUIP) 0.5 MG tablet Take 0.5 mg by mouth at bedtime.   04/22/2019 at 0001  . senna (SENOKOT) 8.6 MG TABS tablet Take 1 tablet by mouth daily at 2 PM.   04/22/2019 at Unknown time  . sodium chloride (OCEAN) 0.65 % SOLN nasal spray Place 1 spray into both nostrils 3 (three) times daily as needed for congestion.  unknown  . traZODone (DESYREL) 100 MG tablet Take 100 mg by mouth at bedtime.   04/22/2019 at 0001  . buprenorphine (BUTRANS) 10 MCG/HR PTWK patch Place 1 patch (10 mcg total) onto the skin once a week. (Patient not taking: Reported on 04/23/2019) 6 patch 0 Not Taking at Unknown time  . cyclobenzaprine (FLEXERIL) 5 MG tablet Take 1 tablet (5 mg total) by mouth at bedtime. (Patient not taking: Reported on 04/23/2019) 30 tablet 0 Not Taking at Unknown time  . furosemide (LASIX) 20 MG tablet Take 1 tablet (20 mg total) by mouth daily as needed for fluid. (Patient not taking: Reported on 04/23/2019) 30 tablet  Not Taking at Unknown time  . insulin aspart (NOVOLOG) 100 UNIT/ML injection Before each meal 3 times a day, 140-199 - 2 units, 200-250 - 4 units, 251-299 - 6 units,  300-349 - 8 units,  350 or above 10 units. Dispense syringes and needles as needed, Ok to switch to PEN if approved. Substitute to any brand approved. DX DM2, Code E11.65 (Patient not taking: Reported on 06/13/2016) 1 vial 0 Not Taking at Unknown time  . insulin glargine (LANTUS) 100 UNIT/ML injection Inject 0.35 mLs (35 Units total) into the skin 2 (two) times daily. (Patient not taking: Reported on 04/23/2019) 10 mL 0 Not Taking at Unknown time  . insulin lispro (HUMALOG) 100 UNIT/ML injection Inject 0.03 mLs (3 Units total) into the skin 3 (three) times daily before meals. Plus the sliding scale with meals only (Patient not taking: Reported on 04/23/2019) 10 mL 0 Not Taking at Unknown time  . methotrexate (RHEUMATREX) 2.5 MG tablet TAKE 8 TABLETS EACH WEEK. (Patient not taking:  Reported on 05/21/2017) 96 tablet 0 Not Taking at Unknown time  . oxyCODONE (OXY IR/ROXICODONE) 5 MG immediate release tablet Take 1 tablet (5 mg total) by mouth 4 (four) times daily. (Patient not taking: Reported on 04/23/2019) 15 tablet 0 Not Taking at Unknown time   Scheduled:  . DULoxetine  120 mg Oral Daily  . fentaNYL  1 patch Transdermal Q72H  . insulin aspart  0-9 Units Subcutaneous TID WC  . insulin detemir  20 Units Subcutaneous BID  . [START ON 04/24/2019] lamoTRIgine  25 mg Oral Q1400  . levothyroxine  137 mcg Oral Q0600  . lisinopril  5 mg Oral Q1400  . mometasone-formoterol  2 puff Inhalation BID  . montelukast  10 mg Oral Q1400  . polyethylene glycol  17 g Oral Q72H  . [START ON 04/24/2019] rOPINIRole  0.5 mg Oral QHS  . senna  1 tablet Oral Q1400  . traZODone  100 mg Oral QHS   Infusions:  . cefTRIAXone (ROCEPHIN)  IV      Assessment: 79yo female presents w/ facial/orbital cellulitis as well as letharfy and confusion >> pt found to be in Afib, to begin heparin.  Goal of Therapy:  Heparin level 0.3-0.7 units/ml Monitor platelets by anticoagulation protocol: Yes   Plan:  Will give heparin 4000 units IV bolus x1 followed by gtt at 1600 units/hr and monitor heparin levels and CBC.  Wynona Neat, PharmD, BCPS  04/23/2019,6:14 AM

## 2019-04-23 NOTE — Progress Notes (Signed)
ANTICOAGULATION CONSULT NOTE - Initial Consult  Pharmacy Consult for heparin>>apixaban Indication: atrial fibrillation  Allergies  Allergen Reactions  . Amoxicillin-Pot Clavulanate Nausea And Vomiting  . Lyrica [Pregabalin]   . Latex Itching and Rash    Patient Measurements: Height: '5\' 9"'$  (175.3 cm) Weight: 285 lb 7.9 oz (129.5 kg) IBW/kg (Calculated) : 66.2 Heparin Dosing Weight: 100kg  Vital Signs: Temp: 98.6 F (37 C) (02/04 0513) Temp Source: Oral (02/04 0513) BP: 110/48 (02/04 0513) Pulse Rate: 72 (02/04 0513)  Labs: Recent Labs    04/22/19 2051 04/22/19 2051 04/23/19 0214 04/23/19 0658  HGB 13.8   < > 13.1 13.2  HCT 43.6  --  40.9 40.8  PLT 298  --  294 313  APTT 30  --   --   --   LABPROT 13.9  --   --   --   INR 1.1  --   --   --   CREATININE 1.42*  --  1.34* 1.30*  TROPONINIHS  --   --   --  19*   < > = values in this interval not displayed.    Estimated Creatinine Clearance: 53.2 mL/min (A) (by C-G formula based on SCr of 1.3 mg/dL (H)).   Medical History: Past Medical History:  Diagnosis Date  . Arthritis   . Asthma   . CFS (chronic fatigue syndrome)   . Compression fracture of vertebral column ([place]) 01/16/2011   per CXR  . DDD (degenerative disc disease), lumbar 01/18/2016  . Diabetes mellitus   . Elevated cholesterol 01/18/2016  . Fibromyalgia 01/18/2016  . Fibromyositis   . Hashimoto's thyroiditis   . HTN (hypertension), benign   . Hypoglycemia associated with diabetes ([place]) 01/16/2011  . Morbid obesity ([place])   . Osteoarthritis of both knees 01/18/2016  . Pneumonia 12/2010  . Psoriasis 01/18/2016  . Psoriatic arthritis ([place])   . Thyroid disease     Medications:  Medications Prior to Admission  Medication Sig Dispense Refill Last Dose  . acetaminophen (TYLENOL) 325 MG tablet Take 650 mg by mouth every 6 (six) hours as needed for mild pain or fever.   unknown  . albuterol (PROVENTIL HFA;VENTOLIN HFA) 108 (90 BASE) MCG/ACT inhaler Inhale  2 puffs into the lungs every 4 (four) hours as needed for wheezing or shortness of breath. 1 Inhaler 1 unknown  . benzonatate (TESSALON) 100 MG capsule Take 100 mg by mouth 4 (four) times daily as needed for cough.   unknown  . cephALEXin (KEFLEX) 500 MG capsule Take 500 mg by mouth 2 (two) times daily.   04/22/2019 at Unknown time  . cetirizine (ZYRTEC) 10 MG tablet Take 10 mg by mouth daily at 2 PM.    04/22/2019 at Unknown time  . chlorhexidine (PERIDEX) 0.12 % solution Use as directed 15 mLs in the mouth or throat 2 (two) times daily.   04/22/2019 at Unknown time  . Cholecalciferol (VITAMIN D) 50 MCG (2000 UT) tablet Take 2,000 Units by mouth daily at 2 PM.   04/22/2019 at Unknown time  . COVID-19 mRNA vaccine, Moderna, (MODERNA COVID-19 VACCINE) 100 MCG/0.5ML SUSP Inject into the muscle once.   04/21/2019 at Unknown time  . Cranberry 450 MG TABS Take 450 mg by mouth daily.   04/22/2019 at Unknown time  . DULoxetine (CYMBALTA) 60 MG capsule Take 1 capsule (60 mg total) by mouth 2 (two) times daily. (Patient taking differently: Take 120 mg by mouth daily. ) 60 capsule 5 04/22/2019 at Unknown time  .  fentaNYL (DURAGESIC - DOSED MCG/HR) 50 MCG/HR Place 1 patch onto the skin every 3 (three) days.    04/20/2019 at Unknown time  . fentaNYL (DURAGESIC) 12 MCG/HR Place 1 patch onto the skin every 3 (three) days.   04/20/2019 at Unknown time  . Fluticasone-Salmeterol (ADVAIR) 500-50 MCG/DOSE AEPB Inhale 1 puff into the lungs 2 (two) times daily.   04/22/2019 at Unknown time  . furosemide (LASIX) 40 MG tablet Take 40 mg by mouth daily at 2 PM.   04/22/2019 at Unknown time  . Glucagon, rDNA, (GLUCAGON EMERGENCY) 1 MG KIT Inject 1 mg as directed every 6 (six) hours as needed (for blood suger <60).   unknown  . guaiFENesin (ROBITUSSIN) 100 MG/5ML liquid Take 200 mg by mouth every 4 (four) hours as needed for cough.    unknown  . insulin aspart (NOVOLOG) 100 UNIT/ML injection Before each meal 3 times a day, 140-199 - 2 units,  200-250 - 4 units, 251-299 - 6 units,  300-349 - 8 units,  350 or above 10 units. Dispense syringes and needles as needed, Ok to switch to PEN if approved. Substitute to any brand approved. DX DM2, Code E11.65 (Patient taking differently: Inject 0-10 Units into the skin See admin instructions. IF Blood sugar is <60 or > 400 = Notify MD 0-200 = 0 units 200-249= 2 units 250-299= 4 units 300-349= 6 units 350-399= 8 units 400-449= 10 units) 1 vial 12 04/22/2019 at Unknown time  . insulin detemir (LEVEMIR) 100 UNIT/ML injection Inject 20-25 Units into the skin See admin instructions. Use 25 units every morning and use 20 units at bedtime   04/21/2019 at Unknown time  . ipratropium (ATROVENT) 0.02 % nebulizer solution Take 0.5 mg by nebulization every 4 (four) hours as needed for wheezing or shortness of breath.   unknown  . lamoTRIgine (LAMICTAL) 25 MG tablet Take 25 mg by mouth daily at 2 PM.    04/22/2019 at Unknown time  . levothyroxine (SYNTHROID, LEVOTHROID) 137 MCG tablet Take 137 mcg by mouth daily.     04/22/2019 at Unknown time  . Lidocaine (ASPERCREME LIDOCAINE) 4 % PTCH Apply 1 patch topically daily.   04/22/2019 at Unknown time  . lisinopril (PRINIVIL,ZESTRIL) 5 MG tablet Take 5 mg by mouth daily at 2 PM.    04/22/2019 at Unknown time  . metFORMIN (GLUCOPHAGE) 1000 MG tablet Take 1,000 mg by mouth daily at 2 PM.   04/22/2019 at Unknown time  . montelukast (SINGULAIR) 10 MG tablet Take 10 mg by mouth daily at 2 PM.    04/22/2019 at Unknown time  . Multiple Vitamin (MULTIVITAMIN WITH MINERALS) TABS tablet Take 1 tablet by mouth daily at 2 PM.   04/22/2019 at Unknown time  . ondansetron (ZOFRAN) 4 MG tablet Take 4 mg by mouth every 6 (six) hours as needed for nausea or vomiting.   unknown  . Oxycodone HCl 10 MG TABS Take 10 mg by mouth 2 (two) times daily.   04/22/2019 at Unknown time  . Oxycodone HCl 10 MG TABS Take 10 mg by mouth every 4 (four) hours as needed (for breakthrough pain).   04/21/2019 at Unknown time   . polyethylene glycol (MIRALAX / GLYCOLAX) 17 g packet Take 17 g by mouth every 3 (three) days.   04/22/2019 at Unknown time  . Propylene Glycol (SYSTANE BALANCE) 0.6 % SOLN Place 2 drops into the right eye daily.   04/22/2019 at Unknown time  . rOPINIRole (REQUIP) 0.5 MG tablet  Take 0.5 mg by mouth at bedtime.   04/22/2019 at 0001  . senna (SENOKOT) 8.6 MG TABS tablet Take 1 tablet by mouth daily at 2 PM.   04/22/2019 at Unknown time  . sodium chloride (OCEAN) 0.65 % SOLN nasal spray Place 1 spray into both nostrils 3 (three) times daily as needed for congestion.   unknown  . traZODone (DESYREL) 100 MG tablet Take 100 mg by mouth at bedtime.   04/22/2019 at 0001  . buprenorphine (BUTRANS) 10 MCG/HR PTWK patch Place 1 patch (10 mcg total) onto the skin once a week. (Patient not taking: Reported on 04/23/2019) 6 patch 0 Not Taking at Unknown time  . cyclobenzaprine (FLEXERIL) 5 MG tablet Take 1 tablet (5 mg total) by mouth at bedtime. (Patient not taking: Reported on 04/23/2019) 30 tablet 0 Not Taking at Unknown time  . furosemide (LASIX) 20 MG tablet Take 1 tablet (20 mg total) by mouth daily as needed for fluid. (Patient not taking: Reported on 04/23/2019) 30 tablet  Not Taking at Unknown time  . insulin aspart (NOVOLOG) 100 UNIT/ML injection Before each meal 3 times a day, 140-199 - 2 units, 200-250 - 4 units, 251-299 - 6 units,  300-349 - 8 units,  350 or above 10 units. Dispense syringes and needles as needed, Ok to switch to PEN if approved. Substitute to any brand approved. DX DM2, Code E11.65 (Patient not taking: Reported on 06/13/2016) 1 vial 0 Not Taking at Unknown time  . insulin glargine (LANTUS) 100 UNIT/ML injection Inject 0.35 mLs (35 Units total) into the skin 2 (two) times daily. (Patient not taking: Reported on 04/23/2019) 10 mL 0 Not Taking at Unknown time  . insulin lispro (HUMALOG) 100 UNIT/ML injection Inject 0.03 mLs (3 Units total) into the skin 3 (three) times daily before meals. Plus the sliding  scale with meals only (Patient not taking: Reported on 04/23/2019) 10 mL 0 Not Taking at Unknown time  . methotrexate (RHEUMATREX) 2.5 MG tablet TAKE 8 TABLETS EACH WEEK. (Patient not taking: Reported on 05/21/2017) 96 tablet 0 Not Taking at Unknown time  . oxyCODONE (OXY IR/ROXICODONE) 5 MG immediate release tablet Take 1 tablet (5 mg total) by mouth 4 (four) times daily. (Patient not taking: Reported on 04/23/2019) 15 tablet 0 Not Taking at Unknown time   Scheduled:  . DULoxetine  120 mg Oral Daily  . fentaNYL  1 patch Transdermal Q72H  . insulin aspart  0-9 Units Subcutaneous TID WC  . insulin detemir  20 Units Subcutaneous BID  . [START ON 04/24/2019] lamoTRIgine  25 mg Oral Q1400  . levothyroxine  137 mcg Oral Q0600  . lisinopril  5 mg Oral Q1400  . metoprolol tartrate  12.5 mg Oral BID  . mometasone-formoterol  2 puff Inhalation BID  . montelukast  10 mg Oral Q1400  . polyethylene glycol  17 g Oral Q72H  . [START ON 04/24/2019] rOPINIRole  0.5 mg Oral QHS  . senna  1 tablet Oral Q1400  . traZODone  100 mg Oral QHS   Infusions:  . cefTRIAXone (ROCEPHIN)  IV    . heparin 1,600 Units/hr (04/23/19 5615)    Assessment: 79 yo female presents w/ facial/orbital cellulitis as well as letharfy and confusion >> pt found to be in Afib, heparin started and now patient to be changed to apixaban. Patient <80 yo, >60 kg, and Scr <1.52m/dl. Patient qualifies for full dose.   Goal of Therapy:  Monitor platelets by anticoagulation protocol: Yes  Plan:  Apixaban 76m PO BID Turn off heparin drip once first dose of apixaban is given Monitor for bleeding  Tiena Manansala A. PL[place] Dy PharmD, BCPS, FNKF Clinical Pharmacist [place] Please utilize Amion for appropriate phone number to reach the unit pharmacist ([place]   04/23/2019,8:19 AM

## 2019-04-23 NOTE — Progress Notes (Signed)
  Echocardiogram 2D Echocardiogram has been performed.  Ashley Caldwell 04/23/2019, 11:05 AM

## 2019-04-23 NOTE — Progress Notes (Signed)
Pharmacy Antibiotic Note  TASHANNA DOLIN is a 79 y.o. female admitted on 04/22/2019 with herpes zoster.  Pharmacy has been consulted for IV acyclovir dosing. Will dose acyclovir based on Adjusted body weight for obese patient. CrCl >50 ml/min  Plan: Acyclovir 915 mg IV q8h Would recommend fluids to prevent AKI with IV acyclovir use Monitor for deescalation and clinical course  Height: 5\' 9"  (175.3 cm) Weight: 285 lb 7.9 oz (129.5 kg) IBW/kg (Calculated) : 66.2  Temp (24hrs), Avg:98.4 F (36.9 C), Min:97.9 F (36.6 C), Max:98.6 F (37 C)  Recent Labs  Lab 04/22/19 2051 04/23/19 0214 04/23/19 0658  WBC 6.6 6.5 6.3  CREATININE 1.42* 1.34* 1.30*  LATICACIDVEN 1.3  --   --     Estimated Creatinine Clearance: 53.2 mL/min (A) (by C-G formula based on SCr of 1.3 mg/dL (H)).    Allergies  Allergen Reactions  . Amoxicillin-Pot Clavulanate Nausea And Vomiting  . Lyrica [Pregabalin]   . Latex Itching and Rash    Albertine Lafoy A. 06/21/19, PharmD, BCPS, FNKF Clinical Pharmacist [place] Please utilize Amion for appropriate phone number to reach the unit pharmacist [facility])   04/23/2019 11:20 AM

## 2019-04-23 NOTE — Progress Notes (Addendum)
PROLONGED SERVICES CARE NOTE   04/23/2019 12:40 PM  Ashley Caldwell was seen and examined.  The H&P by the admitting provider, orders, imaging was reviewed.  Please see new orders.   On my exam today the right eye lesion appeared herpetic.  I ordered to start acyclovir.  I consulted ophthalmology and spoke with Dr. Valetta Close.  I conferenced with Dr. Valetta Close and reviewed photos of the right eye with him.  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  He says to discontinue antibiotics.  He says that the patient does not need to be in the hospital for the eye condition.  He will see the patient in his office tomorrow at 1:15 pm.   I am keeping patient overnight to be sure she has no recurrence of Afib RVR.  Her heart rate is very well controlled at this time.  Also with her suppressed TSH I have reduced her levothyroxine dose in the setting of paroxysmal atrial fibrillation.  2D Echocardiogram is pending.  I spoke with patient's daughter this morning and tried to update her with new information but she did not answer.   Anticipate patient can safely return to SNF tomorrow and see Dr. Valetta Close in the office or reschedule if not able to go tomorrow but DO NOT ALLOW HER NOT TO [place].   I INFORMED CARE MANAGER AND SOCIAL WORKER.  I requested RN to notify infection prevention to recommend and arrange proper isolation for herpes zoster infection involving right eye.    Vitals:   04/23/19 0205 04/23/19 0513  BP: 132/76 (!) 110/48  Pulse: 89 72  Resp: 15 20  Temp: 98.6 F (37 C) 98.6 F (37 C)  SpO2: 93% 96%    Results for orders placed or performed during the hospital encounter of 04/22/19  Blood Culture (routine x 2)   Specimen: BLOOD  Result Value Ref Range   Specimen Description BLOOD LEFT ANTECUBITAL    Special Requests      BOTTLES DRAWN AEROBIC AND ANAEROBIC Blood  Culture adequate volume   Culture      NO GROWTH < 24 HOURS Performed at [facility] [address] [place], [place] [address]    Report Status PENDING   SARS CORONAVIRUS 2 (TAT 6-24 HRS) Nasopharyngeal Nasopharyngeal Swab   Specimen: Nasopharyngeal Swab  Result Value Ref Range   SARS Coronavirus 2 POSITIVE (A) NEGATIVE  Lactic acid, plasma  Result Value Ref Range   Lactic Acid, Venous 1.3 0.5 - 1.9 mmol/L  Comprehensive metabolic panel  Result Value Ref Range   Sodium 133 (L) 135 - 145 mmol/L   Potassium 4.3 3.5 - 5.1 mmol/L   Chloride 91 (L) 98 - 111 mmol/L   CO2 29 22 - 32 mmol/L   Glucose, Bld 207 (H) 70 - 99 mg/dL   BUN 16 8 - 23 mg/dL   Creatinine, Ser 1.42 (H) 0.44 - 1.00 mg/dL   Calcium 9.9 8.9 - 10.3 mg/dL   Total Protein 6.4 (L) 6.5 - 8.1 g/dL   Albumin 2.8 (L) 3.5 - 5.0 g/dL   AST 60 (H) 15 - 41 U/L   ALT 48 (H) 0 - 44 U/L   Alkaline Phosphatase 533 (H) 38 - 126 U/L   Total Bilirubin 1.7 (H) 0.3 - 1.2 mg/dL   GFR calc non Af Amer 36 (L) >60 mL/min   GFR calc  Af Amer 41 (L) >60 mL/min   Anion gap 13 5 - 15  CBC WITH DIFFERENTIAL  Result Value Ref Range   WBC 6.6 4.0 - 10.5 K/uL   RBC 4.82 3.87 - 5.11 MIL/uL   Hemoglobin 13.8 12.0 - 15.0 g/dL   HCT 61.9 50.9 - 32.6 %   MCV 90.5 80.0 - 100.0 fL   MCH 28.6 26.0 - 34.0 pg   MCHC 31.7 30.0 - 36.0 g/dL   RDW 71.2 45.8 - 09.9 %   Platelets 298 150 - 400 K/uL   nRBC 0.0 0.0 - 0.2 %   Neutrophils Relative % 68 %   Neutro Abs 4.6 1.7 - 7.7 K/uL   Lymphocytes Relative 14 %   Lymphs Abs 0.9 0.7 - 4.0 K/uL   Monocytes Relative 15 %   Monocytes Absolute 1.0 0.1 - 1.0 K/uL   Eosinophils Relative 1 %   Eosinophils Absolute 0.0 0.0 - 0.5 K/uL   Basophils Relative 1 %   Basophils Absolute 0.0 0.0 - 0.1 K/uL   Immature Granulocytes 1 %   Abs Immature Granulocytes 0.05 0.00 - 0.07 K/uL  APTT  Result Value Ref Range   aPTT 30 24 - 36 seconds  Protime-INR  Result Value Ref Range   Prothrombin Time 13.9 11.4 -  15.2 seconds   INR 1.1 0.8 - 1.2  Urinalysis, Routine w reflex microscopic  Result Value Ref Range   Color, Urine AMBER (A) YELLOW   APPearance HAZY (A) CLEAR   Specific Gravity, Urine 1.024 1.005 - 1.030   pH 5.0 5.0 - 8.0   Glucose, UA NEGATIVE NEGATIVE mg/dL   Hgb urine dipstick SMALL (A) NEGATIVE   Bilirubin Urine NEGATIVE NEGATIVE   Ketones, ur 5 (A) NEGATIVE mg/dL   Protein, ur 30 (A) NEGATIVE mg/dL   Nitrite NEGATIVE NEGATIVE   Leukocytes,Ua TRACE (A) NEGATIVE   RBC / HPF 11-20 0 - 5 RBC/hpf   WBC, UA 11-20 0 - 5 WBC/hpf   Bacteria, UA FEW (A) NONE SEEN   Squamous Epithelial / LPF 0-5 0 - 5   Mucus PRESENT    Hyaline Casts, UA PRESENT   Ammonia  Result Value Ref Range   Ammonia 30 9 - 35 umol/L  CBC  Result Value Ref Range   WBC 6.5 4.0 - 10.5 K/uL   RBC 4.50 3.87 - 5.11 MIL/uL   Hemoglobin 13.1 12.0 - 15.0 g/dL   HCT 83.3 82.5 - 05.3 %   MCV 90.9 80.0 - 100.0 fL   MCH 29.1 26.0 - 34.0 pg   MCHC 32.0 30.0 - 36.0 g/dL   RDW 97.6 73.4 - 19.3 %   Platelets 294 150 - 400 K/uL   nRBC 0.0 0.0 - 0.2 %  Creatinine, serum  Result Value Ref Range   Creatinine, Ser 1.34 (H) 0.44 - 1.00 mg/dL   GFR calc non Af Amer 38 (L) >60 mL/min   GFR calc Af Amer 44 (L) >60 mL/min  Glucose, capillary  Result Value Ref Range   Glucose-Capillary 183 (H) 70 - 99 mg/dL  Hepatic function panel  Result Value Ref Range   Total Protein 5.8 (L) 6.5 - 8.1 g/dL   Albumin 2.5 (L) 3.5 - 5.0 g/dL   AST 43 (H) 15 - 41 U/L   ALT 40 0 - 44 U/L   Alkaline Phosphatase 480 (H) 38 - 126 U/L   Total Bilirubin 1.2 0.3 - 1.2 mg/dL   Bilirubin, Direct 0.5 (H) 0.0 -  0.2 mg/dL   Indirect Bilirubin 0.7 0.3 - 0.9 mg/dL  CBC with Differential/Platelet  Result Value Ref Range   WBC 6.3 4.0 - 10.5 K/uL   RBC 4.52 3.87 - 5.11 MIL/uL   Hemoglobin 13.2 12.0 - 15.0 g/dL   HCT 96.7 59.1 - 63.8 %   MCV 90.3 80.0 - 100.0 fL   MCH 29.2 26.0 - 34.0 pg   MCHC 32.4 30.0 - 36.0 g/dL   RDW 46.6 59.9 - 35.7 %    Platelets 313 150 - 400 K/uL   nRBC 0.0 0.0 - 0.2 %   Neutrophils Relative % 58 %   Neutro Abs 3.7 1.7 - 7.7 K/uL   Lymphocytes Relative 23 %   Lymphs Abs 1.4 0.7 - 4.0 K/uL   Monocytes Relative 15 %   Monocytes Absolute 0.9 0.1 - 1.0 K/uL   Eosinophils Relative 2 %   Eosinophils Absolute 0.1 0.0 - 0.5 K/uL   Basophils Relative 1 %   Basophils Absolute 0.1 0.0 - 0.1 K/uL   Immature Granulocytes 1 %   Abs Immature Granulocytes 0.05 0.00 - 0.07 K/uL  Basic metabolic panel  Result Value Ref Range   Sodium 134 (L) 135 - 145 mmol/L   Potassium 3.9 3.5 - 5.1 mmol/L   Chloride 91 (L) 98 - 111 mmol/L   CO2 30 22 - 32 mmol/L   Glucose, Bld 219 (H) 70 - 99 mg/dL   BUN 14 8 - 23 mg/dL   Creatinine, Ser 0.17 (H) 0.44 - 1.00 mg/dL   Calcium 9.6 8.9 - 79.3 mg/dL   GFR calc non Af Amer 40 (L) >60 mL/min   GFR calc Af Amer 46 (L) >60 mL/min   Anion gap 13 5 - 15  Hepatitis panel, acute  Result Value Ref Range   Hepatitis B Surface Ag NON REACTIVE NON REACTIVE   HCV Ab NON REACTIVE NON REACTIVE   Hep A IgM NON REACTIVE NON REACTIVE   Hep B C IgM NON REACTIVE NON REACTIVE  TSH  Result Value Ref Range   TSH 0.090 (L) 0.350 - 4.500 uIU/mL  Glucose, capillary  Result Value Ref Range   Glucose-Capillary 203 (H) 70 - 99 mg/dL  ECHOCARDIOGRAM COMPLETE  Result Value Ref Range   Weight 4,567.93 oz   Height 69 in   BP 110/48 mmHg  Troponin I (High Sensitivity)  Result Value Ref Range   Troponin I (High Sensitivity) 19 (H) <18 ng/L  Troponin I (High Sensitivity)  Result Value Ref Range   Troponin I (High Sensitivity) 19 (H) <18 ng/L   Time Spent: 40  Wendie Agreste, MD [facility]   04/22/2019  8:03 PM How to contact the [facility] Attending or Consulting provider 7A - 7P or covering provider during after hours 7P -7A, for this patient?  1. Check the care team in [facility] and look for a) attending/consulting [facility] provider listed and b) the [facility] team listed 2. Log into [email or web address] and  use [place]'s universal password to access. If you do not have the password, please contact the hospital operator. 3. Locate the [facility] provider you are looking for under [facility] and page to a number that you can be directly reached. 4. If you still have difficulty reaching the provider, please page the [facility] (Director on Call) for the Hospitalists listed on amion for assistance.

## 2019-04-23 NOTE — Social Work (Addendum)
4:08pm- Pt can return to SNF so long as she does not require negative pressure room. RN staff working with ID for clarification. CSW also confirmed that they cannot arrange outpatient appointment on same day as discharge. Dr. Laural Benes has been secure messaged with these details.   1:30pm- CSW spoke with [name] at [facility], alerted her that MD has arranged a f/u opthamology appointment for tomorrow. Logistically SNF says they cannot manage getting her to her appointment tomorrow and asked if it could be rescheduled. CSW asked MD about this. Of note pt also on airborne/contact precautions per MD and noted on chart. CSW inquired about pt return with those precautions- await response from SNF.   12:47pm- CSW left HIPAA compliant message for pt daughter Varonica Siharath at [phone].   Pt from [facility] where she is LTC resident.   CSW continuing to follow for support with disposition when medically appropriate.  Octavio Graves, MSW, LCSW [facility] Clinical Social Work

## 2019-04-23 NOTE — NC FL2 (Signed)
[place] LEVEL OF CARE SCREENING TOOL     IDENTIFICATION  Patient Name: Ashley Caldwell Birthdate: 12-Dec-1940 Sex: female Admission Date (Current Location): 04/22/2019  [facility] and [place] Number:  Herbalist and Address:  [facility], [place] [address], [place], [place] [address]      Provider Number: [id number]  Attending Physician Name and Address:  Murlean Iba, MD  Relative Name and Phone Number:       Current Level of Care: Hospital Recommended Level of Care: [place] Prior Approval Number:    Date Approved/Denied:   PASRR Number:    Discharge Plan: SNF    Current Diagnoses: Patient Active Problem List   Diagnosis Date Noted  . Elevated LFTs 04/23/2019  . Atrial fibrillation ([place]) 04/23/2019  . Chronic pain syndrome 04/23/2019  . Opioid dependence ([place]) 04/23/2019  . Constipation due to opioid therapy 04/23/2019  . 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  . 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  . High risk medication use 06/12/2016  . Closed fracture of right proximal humerus 05/01/2016  . History of renal failure 04/26/2016  . Primary osteoarthritis of both knees 04/26/2016  . Renal failure 04/13/2016  . Acute renal failure ([place]) 04/13/2016  . Psoriasis 01/18/2016  . Fibromyalgia 01/18/2016  . DDD (degenerative disc disease), lumbar 01/18/2016  . Osteoarthritis of both knees 01/18/2016  . Elevated cholesterol 01/18/2016  . Hypoglycemia associated with diabetes ([place]) 01/16/2011  . Compression fracture of vertebral column ([place]) 01/16/2011  . Hyponatremia 01/14/2011  . Pneumonia 01/13/2011  . Hyperglycemia 01/13/2011  . Weakness generalized 01/13/2011  . DM type 2 (diabetes mellitus, type 2) ([place]) 01/13/2011  . HTN (hypertension), benign 01/13/2011  . Psoriatic arthritis ([place]) 01/13/2011  . Anemia 01/13/2011  . Thyroiditis, chronic 01/13/2011  . Morbid obesity ([place])  01/13/2011  . Asthma 01/13/2011    Orientation RESPIRATION BLADDER Height & Weight     Self, Place  O2([place] 3L) Incontinent Weight: 285 lb 7.9 oz (129.5 kg) Height:  5\' 9"  (175.3 cm)  BEHAVIORAL SYMPTOMS/MOOD NEUROLOGICAL BOWEL NUTRITION STATUS      Incontinent Diet(see DC summary)  AMBULATORY STATUS COMMUNICATION OF NEEDS Skin   Extensive Assist Verbally Normal                       Personal Care Assistance Level of Assistance  Bathing, Feeding, Dressing Bathing Assistance: Maximum assistance Feeding assistance: Limited assistance Dressing Assistance: Maximum assistance     Functional Limitations Info  Sight Sight Info: Impaired        SPECIAL CARE FACTORS FREQUENCY                       Contractures Contractures Info: Not present    Additional Factors Info  Code Status, Allergies, Psychotropic, Insulin Sliding Scale Code Status Info: DNR Allergies Info: Lyrica Pregabalin, Amoxicillin-pot Clavulanate, Latex Psychotropic Info: Cymbalta 120mg  daily; Lamictal 25mg  daily Insulin Sliding Scale Info: 0-9 units 3x/day; Levemir 20 units 2x/day       Current Medications (04/23/2019):  This is the current hospital active medication list Current Facility-Administered Medications  Medication Dose Route Frequency Provider Last Rate Last Admin  . acetaminophen (TYLENOL) tablet 650 mg  650 mg Oral Q6H PRN Rise Patience, MD   650 mg at 04/23/19 1119   Or  . acetaminophen (TYLENOL) suppository 650 mg  650 mg Rectal Q6H PRN Rise Patience, MD      .  albuterol (PROVENTIL) (2.5 MG/3ML) 0.083% nebulizer solution 2.5 mg  2.5 mg Inhalation Q4H PRN Eduard Clos, MD      . apixaban Everlene Balls) tablet 5 mg  5 mg Oral BID Lodema Hong A, RPH   5 mg at 04/23/19 1119  . DULoxetine (CYMBALTA) DR capsule 120 mg  120 mg Oral Daily Eduard Clos, MD   120 mg at 04/23/19 1143  . erythromycin ophthalmic ointment   Right Eye TID Laural Benes, Clanford L, MD      .  fentaNYL (DURAGESIC) 50 MCG/HR 1 patch  1 patch Transdermal Q72H Eduard Clos, MD   1 patch at 04/23/19 0232  . [START ON 04/24/2019] furosemide (LASIX) tablet 40 mg  40 mg Oral Q1400 Johnson, Clanford L, MD      . insulin aspart (novoLOG) injection 0-9 Units  0-9 Units Subcutaneous TID WC Eduard Clos, MD   2 Units at 04/23/19 1317  . insulin detemir (LEVEMIR) injection 20 Units  20 Units Subcutaneous BID Eduard Clos, MD   20 Units at 04/23/19 [phone]  . [START ON 04/24/2019] lamoTRIgine (LAMICTAL) tablet 25 mg  25 mg Oral Q1400 Eduard Clos, MD      . Melene Muller ON 04/24/2019] levothyroxine (SYNTHROID) tablet 100 mcg  100 mcg Oral Q0600 Johnson, Clanford L, MD      . metoprolol tartrate (LOPRESSOR) tablet 12.5 mg  12.5 mg Oral BID Johnson, Clanford L, MD   12.5 mg at 04/23/19 1143  . mometasone-formoterol (DULERA) 200-5 MCG/ACT inhaler 2 puff  2 puff Inhalation BID Eduard Clos, MD      . montelukast (SINGULAIR) tablet 10 mg  10 mg Oral Q1400 Eduard Clos, MD   10 mg at 04/23/19 1428  . ondansetron (ZOFRAN) tablet 4 mg  4 mg Oral Q6H PRN Eduard Clos, MD       Or  . ondansetron [facility] injection 4 mg  4 mg Intravenous Q6H PRN Eduard Clos, MD      . oxyCODONE (Oxy IR/ROXICODONE) immediate release tablet 10 mg  10 mg Oral Q4H PRN Laural Benes, Clanford L, MD   10 mg at 04/23/19 1119  . polyethylene glycol (MIRALAX / GLYCOLAX) packet 17 g  17 g Oral Q72H Eduard Clos, MD   17 g at 04/23/19 0228  . [START ON 04/24/2019] rOPINIRole (REQUIP) tablet 0.5 mg  0.5 mg Oral QHS Eduard Clos, MD      . senna [facility]) tablet 8.6 mg  1 tablet Oral Q1400 Eduard Clos, MD   8.6 mg at 04/23/19 1428  . traZODone (DESYREL) tablet 100 mg  100 mg Oral QHS Eduard Clos, MD      . valACYclovir (VALTREX) tablet 1,000 mg  1,000 mg Oral TID Cleora Fleet, MD         Discharge Medications: Please see discharge summary for a list of  discharge medications.  Relevant Imaging Results:  Relevant Lab Results:   Additional Information SS#: [id number]  Baldemar Lenis, LCSW

## 2019-04-23 NOTE — Progress Notes (Signed)
Pharmacy Antibiotic Note  Ashley Caldwell is a 79 y.o. female admitted on 04/22/2019 with cellulitis.  Pharmacy has been consulted for vancomycin dosing. Vancomycin 1gm given in ED  Plan: Give additional 1gm dose now for total 2gm loading dose then vancomycin 1500 mg IV q36 hours F/u renal function, cultures and clinical course  Height: 5\' 9"  (175.3 cm) Weight: 285 lb 7.9 oz (129.5 kg) IBW/kg (Calculated) : 66.2  Temp (24hrs), Avg:98.3 F (36.8 C), Min:97.9 F (36.6 C), Max:98.6 F (37 C)  Recent Labs  Lab 04/22/19 2051 04/23/19 0214  WBC 6.6 6.5  CREATININE 1.42*  --   LATICACIDVEN 1.3  --     Estimated Creatinine Clearance: 48.7 mL/min (A) (by C-G formula based on SCr of 1.42 mg/dL (H)).    Allergies  Allergen Reactions  . Amoxicillin-Pot Clavulanate Nausea And Vomiting  . Lyrica [Pregabalin]   . Latex Itching and Rash   Thank you for allowing pharmacy to be a part of this patient's care.  06/21/19 04/23/2019 3:39 AM

## 2019-04-23 NOTE — H&P (Signed)
History and Physical    Ashley Caldwell ZOX:[id number] DOB: Apr 05, 1940 DOA: 04/22/2019  PCP: Celene Squibb, MD  Patient coming from: [facility]illed nursing facility.  Chief Complaint: Right periorbital swelling.  HPI: Ashley Caldwell is a 79 y.o. female with history of diabetes mellitus type 2, hypertension, fibromyalgia hypothyroidism morbid obesity on chronic pain medications was referred to the ER the patient had gone to ophthalmologist office.  Patient also was found to be in A. fib with RVR.  Patient states over the last 3 to 4 days patient has been having swelling of the right periorbital area.  Is able to see through the but swelling has been progressive worsening with some matting of the eye.  Denies any fever chills.  Denies any significant pain around the swelling of the eyes.  Patient has generalized body ache.  ED Course: In the ER patient was in A. fib with RVR rate around 100 to 110 bpm.  Chest x-ray unremarkable UA is showing nonspecific finding.  Possible UTI.  Covid test is pending.  CT head was done since patient initially was mildly lethargic.  Does not show to the acute but does show features concerning for soft tissue swelling around the right periorbital area.  At the time of my exam patient has become more alert awake.  Labs show creatinine 1.4 LFTs were mildly elevated AST at 60 ALT 48 ammonia 30 blood glucose 207 CBC unremarkable lactic acid 1.3.  Patient was started on empiric antibiotic coverage cultures were obtained.  Review of Systems: As per HPI, rest all negative.   Past Medical History:  Diagnosis Date  . Arthritis   . Asthma   . CFS (chronic fatigue syndrome)   . Compression fracture of vertebral column ([place]) 01/16/2011   per CXR  . DDD (degenerative disc disease), lumbar 01/18/2016  . Diabetes mellitus   . Elevated cholesterol 01/18/2016  . Fibromyalgia 01/18/2016  . Fibromyositis   . Hashimoto's thyroiditis   . HTN (hypertension), benign   . Hypoglycemia  associated with diabetes ([place]) 01/16/2011  . Morbid obesity ([place])   . Osteoarthritis of both knees 01/18/2016  . Pneumonia 12/2010  . Psoriasis 01/18/2016  . Psoriatic arthritis ([place])   . Thyroid disease     Past Surgical History:  Procedure Laterality Date  . ABDOMINAL HYSTERECTOMY    . ABDOMINAL SURGERY     lapband  . CATARACT EXTRACTION    . KNEE SURGERY       reports that she quit smoking about 28 years ago. Her smoking use included cigarettes. She quit after 20.00 years of use. She has never used smokeless tobacco. She reports that she does not drink alcohol or use drugs.  Allergies  Allergen Reactions  . Amoxicillin-Pot Clavulanate Nausea And Vomiting  . Lyrica [Pregabalin]   . Latex Itching and Rash    Family History  Problem Relation Age of Onset  . Diabetes Father   . Hypertension Mother   . Stroke Mother   . Cancer Brother   . Cancer Brother     Prior to Admission medications   Medication Sig Start Date End Date Taking? Authorizing Provider  acetaminophen (TYLENOL) 325 MG tablet Take 650 mg by mouth every 6 (six) hours as needed for mild pain or fever.   Yes [provider]  albuterol (PROVENTIL HFA;VENTOLIN HFA) 108 (90 BASE) MCG/ACT inhaler Inhale 2 puffs into the lungs every 4 (four) hours as needed for wheezing or shortness of breath. 01/16/11 04/22/28 Yes  Rexene Alberts, MD  benzonatate (TESSALON) 100 MG capsule Take 100 mg by mouth 4 (four) times daily as needed for cough.   Yes [provider]  cephALEXin (KEFLEX) 500 MG capsule Take 500 mg by mouth 2 (two) times daily.   Yes [provider]  cetirizine (ZYRTEC) 10 MG tablet Take 10 mg by mouth daily at 2 PM.    Yes [provider]  chlorhexidine (PERIDEX) 0.12 % solution Use as directed 15 mLs in the mouth or throat 2 (two) times daily.   Yes [provider]  Cholecalciferol (VITAMIN D) 50 MCG (2000 UT) tablet Take 2,000 Units by mouth daily at 2 PM.   Yes  [provider]  COVID-19 mRNA vaccine, Moderna, (MODERNA COVID-19 VACCINE) 100 MCG/0.5ML SUSP Inject into the muscle once.   Yes [provider]  Cranberry 450 MG TABS Take 450 mg by mouth daily.   Yes [provider]  DULoxetine (CYMBALTA) 60 MG capsule Take 1 capsule (60 mg total) by mouth 2 (two) times daily. Patient taking differently: Take 120 mg by mouth daily.  01/19/16 04/22/28 Yes Panwala, Naitik, PA-C  fentaNYL (DURAGESIC - DOSED MCG/HR) 50 MCG/HR Place 1 patch onto the skin every 3 (three) days.  02/14/17  Yes [provider]  fentaNYL (DURAGESIC) 12 MCG/HR Place 1 patch onto the skin every 3 (three) days.   Yes [provider]  Fluticasone-Salmeterol (ADVAIR) 500-50 MCG/DOSE AEPB Inhale 1 puff into the lungs 2 (two) times daily.   Yes [provider]  furosemide (LASIX) 40 MG tablet Take 40 mg by mouth daily at 2 PM.   Yes [provider]  Glucagon, rDNA, (GLUCAGON EMERGENCY) 1 MG KIT Inject 1 mg as directed every 6 (six) hours as needed (for blood suger <60).   Yes [provider]  guaiFENesin (ROBITUSSIN) 100 MG/5ML liquid Take 200 mg by mouth every 4 (four) hours as needed for cough.    Yes [provider]  insulin aspart (NOVOLOG) 100 UNIT/ML injection Before each meal 3 times a day, 140-199 - 2 units, 200-250 - 4 units, 251-299 - 6 units,  300-349 - 8 units,  350 or above 10 units. Dispense syringes and needles as needed, Ok to switch to PEN if approved. Substitute to any brand approved. DX DM2, Code E11.65 Patient taking differently: Inject 0-10 Units into the skin See admin instructions. IF Blood sugar is <60 or > 400 = Notify MD 0-200 = 0 units 200-249= 2 units 250-299= 4 units 300-349= 6 units 350-399= 8 units 400-449= 10 units 04/16/16  Yes Thurnell Lose, MD  insulin detemir (LEVEMIR) 100 UNIT/ML injection Inject 20-25 Units into the skin See admin instructions. Use 25 units every morning and  use 20 units at bedtime   Yes [provider]  ipratropium (ATROVENT) 0.02 % nebulizer solution Take 0.5 mg by nebulization every 4 (four) hours as needed for wheezing or shortness of breath.   Yes [provider]  lamoTRIgine (LAMICTAL) 25 MG tablet Take 25 mg by mouth daily at 2 PM.    Yes [provider]  levothyroxine (SYNTHROID, LEVOTHROID) 137 MCG tablet Take 137 mcg by mouth daily.     Yes [provider]  Lidocaine (ASPERCREME LIDOCAINE) 4 % PTCH Apply 1 patch topically daily.   Yes [provider]  lisinopril (PRINIVIL,ZESTRIL) 5 MG tablet Take 5 mg by mouth daily at 2 PM.    Yes [provider]  metFORMIN (GLUCOPHAGE) 1000 MG tablet  Take 1,000 mg by mouth daily at 2 PM.   Yes [provider]  montelukast (SINGULAIR) 10 MG tablet Take 10 mg by mouth daily at 2 PM.    Yes [provider]  Multiple Vitamin (MULTIVITAMIN WITH MINERALS) TABS tablet Take 1 tablet by mouth daily at 2 PM.   Yes [provider]  ondansetron (ZOFRAN) 4 MG tablet Take 4 mg by mouth every 6 (six) hours as needed for nausea or vomiting.   Yes [provider]  Oxycodone HCl 10 MG TABS Take 10 mg by mouth 2 (two) times daily.   Yes [provider]  Oxycodone HCl 10 MG TABS Take 10 mg by mouth every 4 (four) hours as needed (for breakthrough pain).   Yes [provider]  polyethylene glycol (MIRALAX / GLYCOLAX) 17 g packet Take 17 g by mouth every 3 (three) days.   Yes [provider]  Propylene Glycol (SYSTANE BALANCE) 0.6 % SOLN Place 2 drops into the right eye daily.   Yes [provider]  rOPINIRole (REQUIP) 0.5 MG tablet Take 0.5 mg by mouth at bedtime.   Yes [provider]  senna (SENOKOT) 8.6 MG TABS tablet Take 1 tablet by mouth daily at 2 PM.   Yes [provider]  sodium chloride (OCEAN) 0.65 % SOLN nasal spray Place 1 spray into both nostrils 3 (three) times daily as  needed for congestion.   Yes [provider]  traZODone (DESYREL) 100 MG tablet Take 100 mg by mouth at bedtime.   Yes [provider]  buprenorphine (BUTRANS) 10 MCG/HR PTWK patch Place 1 patch (10 mcg total) onto the skin once a week. Patient not taking: Reported on 04/23/2019 04/27/16   Carole Civil, MD  cyclobenzaprine (FLEXERIL) 5 MG tablet Take 1 tablet (5 mg total) by mouth at bedtime. Patient not taking: Reported on 04/23/2019 04/16/16   Thurnell Lose, MD  furosemide (LASIX) 20 MG tablet Take 1 tablet (20 mg total) by mouth daily as needed for fluid. Patient not taking: Reported on 04/23/2019 04/16/16   Thurnell Lose, MD  insulin aspart (NOVOLOG) 100 UNIT/ML injection Before each meal 3 times a day, 140-199 - 2 units, 200-250 - 4 units, 251-299 - 6 units,  300-349 - 8 units,  350 or above 10 units. Dispense syringes and needles as needed, Ok to switch to PEN if approved. Substitute to any brand approved. DX DM2, Code E11.65 Patient not taking: Reported on 06/13/2016 04/16/16   Thurnell Lose, MD  insulin glargine (LANTUS) 100 UNIT/ML injection Inject 0.35 mLs (35 Units total) into the skin 2 (two) times daily. Patient not taking: Reported on 04/23/2019 04/16/16   Thurnell Lose, MD  insulin lispro (HUMALOG) 100 UNIT/ML injection Inject 0.03 mLs (3 Units total) into the skin 3 (three) times daily before meals. Plus the sliding scale with meals only Patient not taking: Reported on 04/23/2019 04/16/16   Thurnell Lose, MD  methotrexate (RHEUMATREX) 2.5 MG tablet TAKE 8 TABLETS EACH WEEK. Patient not taking: Reported on 05/21/2017 03/07/16   Bo Merino, MD  oxyCODONE (OXY IR/ROXICODONE) 5 MG immediate release tablet Take 1 tablet (5 mg total) by mouth 4 (four) times daily. Patient not taking: Reported on 04/23/2019 04/16/16   Thurnell Lose, MD  etanercept (ENBREL) 50 MG/ML injection Inject 50 mg into the skin once a week. Give on Saturday!! Patient has not given  injection in 2 weeks!!!  01/19/16  [provider]  Physical Exam: Constitutional: Moderately built and nourished. Vitals:   04/22/19 2009 04/22/19 2114  BP: (!) 121/106 106/76  Pulse: 83 88  Resp: 18 18  Temp: 97.9 F (36.6 C)   TempSrc: Oral   SpO2: 98% 99%   Eyes: Anicteric no pallor.  Right periorbital swelling with both eyelids swollen of the right eye with patient able to move the eyes and able to see. ENMT: No discharge from the ears nose or mouth. Neck: No mass felt.  No JVD appreciated. Respiratory: No rhonchi or crepitations. Cardiovascular: S1-S2 heard. Abdomen: Soft nontender bowel sound present. Musculoskeletal: No edema.  No joint effusion. Skin: No rash.  Swelling of the right eye. Neurologic: Alert awake oriented to time place and person.  Moves all extremities. Psychiatric: Appears normal per normal affect.   Labs on Admission: I have personally reviewed following labs and imaging studies  CBC: Recent Labs  Lab 04/22/19 2051  WBC 6.6  NEUTROABS 4.6  HGB 13.8  HCT 43.6  MCV 90.5  PLT 814   Basic Metabolic Panel: Recent Labs  Lab 04/22/19 2051  NA 133*  K 4.3  CL 91*  CO2 29  GLUCOSE 207*  BUN 16  CREATININE 1.42*  CALCIUM 9.9   GFR: CrCl cannot be calculated (Unknown ideal weight.). Liver Function Tests: Recent Labs  Lab 04/22/19 2051  AST 60*  ALT 48*  ALKPHOS 533*  BILITOT 1.7*  PROT 6.4*  ALBUMIN 2.8*   No results for input(s): LIPASE, AMYLASE in the last 168 hours. Recent Labs  Lab 04/22/19 2052  AMMONIA 30   Coagulation Profile: Recent Labs  Lab 04/22/19 2051  INR 1.1   Cardiac Enzymes: No results for input(s): CKTOTAL, CKMB, CKMBINDEX, TROPONINI in the last 168 hours. BNP (last 3 results) No results for input(s): PROBNP in the last 8760 hours. HbA1C: No results for input(s): HGBA1C in the last 72 hours. CBG: No results for input(s): GLUCAP in the last 168 hours. Lipid Profile: No results for  input(s): CHOL, HDL, LDLCALC, TRIG, CHOLHDL, LDLDIRECT in the last 72 hours. Thyroid Function Tests: No results for input(s): TSH, T4TOTAL, FREET4, T3FREE, THYROIDAB in the last 72 hours. Anemia Panel: No results for input(s): VITAMINB12, FOLATE, FERRITIN, TIBC, IRON, RETICCTPCT in the last 72 hours. Urine analysis:    Component Value Date/Time   COLORURINE AMBER (A) 06/17/2016 1941   APPEARANCEUR CLEAR 06/17/2016 1941   LABSPEC 1.021 06/17/2016 1941   PHURINE 5.0 06/17/2016 1941   GLUCOSEU 50 (A) 06/17/2016 1941   HGBUR NEGATIVE 06/17/2016 1941   BILIRUBINUR NEGATIVE 06/17/2016 1941   KETONESUR 20 (A) 06/17/2016 1941   PROTEINUR NEGATIVE 06/17/2016 1941   UROBILINOGEN 0.2 01/13/2011 1819   NITRITE NEGATIVE 06/17/2016 1941   LEUKOCYTESUR NEGATIVE 06/17/2016 1941   Sepsis Labs: '@LABRCNTIP'$ (procalcitonin:4,lacticidven:4) )No results found for this or any previous visit (from the past 240 hour(s)).   Radiological Exams on Admission: CT Head Wo Contrast  Result Date: 04/22/2019 CLINICAL DATA:  Mental status change EXAM: CT HEAD WITHOUT CONTRAST TECHNIQUE: Contiguous axial images were obtained from the base of the skull through the vertex without intravenous contrast. COMPARISON:  None. FINDINGS: Brain: There is no acute intracranial hemorrhage, mass effect, or edema. Gray-white differentiation is preserved. Patchy hypoattenuation in the supratentorial white matter is nonspecific may reflect mild chronic microvascular ischemic changes. Prominence of the ventricles and sulci reflects minor generalized parenchymal volume loss. There is no extra-axial fluid collection. Vascular: There is intracranial atherosclerotic calcification at the skull base. Skull: Unremarkable. Sinuses/Orbits:  Right periorbital soft tissue swelling. Right frontal sinus partial opacification. Other: Mastoid air cells are clear. IMPRESSION: No acute intracranial hemorrhage, mass effect, or evidence of acute infarction.  Chronic microvascular ischemic changes. Right periorbital soft tissue swelling. Electronically Signed   By: Macy Mis M.D.   On: 04/22/2019 21:53   US Abdomen Complete  Result Date: 04/23/2019 CLINICAL DATA:  Elevated LFTs EXAM: ABDOMEN ULTRASOUND COMPLETE COMPARISON:  None. FINDINGS: Gallbladder: No gallstones or wall thickening visualized. No sonographic Murphy sign noted by sonographer. Common bile duct: Diameter: Could not be visualized prior to the termination of the procedure at the request of the patient. Liver: Diffusely increased in echogenicity likely related to fatty infiltration. Portal vein is not visualized IVC: No abnormality visualized. Pancreas: Not visualized Spleen: Size and appearance within normal limits. Right Kidney: Length: 9.9 cm. Echogenicity within normal limits. No mass or hydronephrosis visualized. Left Kidney: Length: Not visualized Abdominal aorta: No aneurysm visualized. Other findings: None. IMPRESSION: Early termination of the procedure at the patient's request. Above-noted nonvisualized structures are related to the termination of the procedure. Fatty infiltration of the liver No other focal abnormality is noted. Electronically Signed   By: Inez Catalina M.D.   On: 04/23/2019 00:04   DG Chest Port 1 View  Result Date: 04/22/2019 CLINICAL DATA:  79 year old female with weakness. EXAM: PORTABLE CHEST 1 VIEW COMPARISON:  Chest radiograph dated 04/13/2016. FINDINGS: There is cardiomegaly with vascular congestion. No focal consolidation, pleural effusion, pneumothorax. There is diffuse interstitial coarsening, chronic. No acute osseous pathology. IMPRESSION: 1. Cardiomegaly with mild vascular congestion. No focal consolidation. 2. Chronic interstitial coarsening and bronchitic changes. Electronically Signed   By: Anner Crete M.D.   On: 04/22/2019 21:25    EKG: Independently reviewed.  A. fib with RVR.  Assessment/Plan Principal Problem:   Periorbital  cellulitis Active Problems:   DM type 2 (diabetes mellitus, type 2) (HCC)   HTN (hypertension), benign   Psoriatic arthritis ([facility]CC)   Asthma   Elevated LFTs    1. Periorbital cellulitis -given immunocompromise status of being a diabetic with history of psoriatic arthritis we will admit patient as inpatient and keep on IV antibiotics follow cultures.  Closely monitor for any worsening. 2. A. fib with RVR appears to be new onset for which I have kept patient on IV heparin for anticoagulation given the chads 2 vasc score of at least 5.  Presently rate is around 85 bpm without any intervention.  Check 2D echo thyroid function test. 3. Elevated LFTs cause not clear.  Sonogram of abdomen was only done partially as patient did not cooperate.  Did not show any gallstones.  Follow LFTs.  There is any further worsening will need further imaging.  Acute hepatitis panel has been ordered. 4. Chronic pain on Duragesic patch. 5. Diabetes mellitus type 2 on Levemir and will keep patient on sliding scale coverage. 6. Hypertension on lisinopril.  Will hold lisinopril due to renal failure.  As needed IV hydralazine. 7. Possible acute renal failure -the last labs we have is in 2018 which shows creatinine 1.8 presently is 1.4.  Will hold off Lasix and lisinopril for now. 8. Patient is on Lasix and chest x-ray does show some congestion.  Patient is not hypoxic.  Holding Lasix due to worsening renal function.  Closely monitor.  Check 2D echo given patient has A. Fib. 9. Hypothyroidism on Synthroid check TSH. 10. Patient is on Lasix not sure the exact reason.  Presently holding due to renal failure.  2D echo has been ordered for the A. Fib. 11. Morbid obesity. 12. History of psoriatic arthritis presently in remission.  Given that patient has immunosuppressed state with diabetes and psoriatic arthritis and has periorbital cellulitis will need IV antibiotics to closely monitor for any deterioration and also has new onset  A. fib bleed inpatient status.  Covid test is pending.  Medication list show patient is on Lamictal.  Will need to confirm this with family.   DVT prophylaxis: Heparin. Code Status: DNR confirmed with patient. Family Communication: Try to reach patient's daughter was unable to. Disposition Plan: To be determined. Consults called: None. Admission status: Inpatient.   Rise Patience MD [facility] Pager [phone].  If 7PM-7AM, please contact night-coverage [email or web address] Password [email or web address]  04/23/2019, 1:10 AM

## 2019-04-24 LAB — BASIC METABOLIC PANEL
Anion gap: 12 (ref 5–15)
BUN: 11 mg/dL (ref 8–23)
CO2: 34 mmol/L — ABNORMAL HIGH (ref 22–32)
Calcium: 9.8 mg/dL (ref 8.9–10.3)
Chloride: 91 mmol/L — ABNORMAL LOW (ref 98–111)
Creatinine, Ser: 1.08 mg/dL — ABNORMAL HIGH (ref 0.44–1.00)
GFR calc Af Amer: 58 mL/min — ABNORMAL LOW (ref >60)
GFR calc non Af Amer: 50 mL/min — ABNORMAL LOW (ref >60)
Glucose, Bld: 90 mg/dL (ref 70–99)
Potassium: 4 mmol/L (ref 3.5–5.1)
Sodium: 137 mmol/L (ref 135–145)

## 2019-04-24 LAB — GLUCOSE, CAPILLARY
Glucose-Capillary: 54 mg/dL — ABNORMAL LOW (ref 70–99)
Glucose-Capillary: 56 mg/dL — ABNORMAL LOW (ref 70–99)
Glucose-Capillary: 124 mg/dL — ABNORMAL HIGH (ref 70–99)
Glucose-Capillary: 101 mg/dL — ABNORMAL HIGH (ref 70–99)
Glucose-Capillary: 146 mg/dL — ABNORMAL HIGH (ref 70–99)
Glucose-Capillary: 151 mg/dL — ABNORMAL HIGH (ref 70–99)

## 2019-04-24 LAB — CBC
HCT: 40.1 % (ref 36.0–46.0)
Hemoglobin: 13.2 g/dL (ref 12.0–15.0)
MCH: 29.3 pg (ref 26.0–34.0)
MCHC: 32.9 g/dL (ref 30.0–36.0)
MCV: 89.1 fL (ref 80.0–100.0)
Platelets: 293 10*3/uL (ref 150–400)
RBC: 4.5 MIL/uL (ref 3.87–5.11)
RDW: 15.1 % (ref 11.5–15.5)
WBC: 6.3 10*3/uL (ref 4.0–10.5)
nRBC: 0 % (ref 0.0–0.2)

## 2019-04-24 LAB — MAGNESIUM: Magnesium: 1.8 mg/dL (ref 1.7–2.4)

## 2019-04-24 MED ORDER — ERYTHROMYCIN 5 MG/GM OP OINT: TOPICAL_OINTMENT | Freq: Three times a day (TID) | OPHTHALMIC | 0 refills | Status: AC

## 2019-04-24 MED ORDER — LEVOTHYROXINE SODIUM 100 MCG PO TABS: 100.0000 ug | ORAL_TABLET | Freq: Every day | ORAL | Status: AC

## 2019-04-24 MED ORDER — VALACYCLOVIR HCL 1 G PO TABS: 1000.0000 mg | ORAL_TABLET | Freq: Three times a day (TID) | ORAL | 0 refills | Status: DC

## 2019-04-24 MED ORDER — FENTANYL 50 MCG/HR TD PT72: 1.0000 | MEDICATED_PATCH | TRANSDERMAL | 0 refills | Status: DC

## 2019-04-24 MED ORDER — METOPROLOL TARTRATE 25 MG PO TABS
25.0000 mg | ORAL_TABLET | Freq: Two times a day (BID) | ORAL | Status: DC
  Administered 2019-04-24 – 2019-04-27 (×7): 25 mg via ORAL
  Filled 2019-04-24 (×7): qty 1

## 2019-04-24 MED ORDER — OXYCODONE HCL 10 MG PO TABS: 10.0000 mg | ORAL_TABLET | ORAL | 0 refills | Status: AC | PRN

## 2019-04-24 NOTE — Progress Notes (Signed)
PROGRESS NOTE    Ashley Caldwell  SEG:[id number] DOB: [birth year] DOA: 04/22/2019 PCP: Benita Stabile, MD    Brief Narrative:  Patient was diagnosed with COVID-19 virus on 03/22/2019 at the nursing home.  She has a history of type 2 diabetes on insulin, hypertension, fibromyalgia, hypothyroidism and morbid obesity, chronic pain syndrome on chronic opiates with pain management sent to the emergency room from ophthalmologist office where she was found with A. fib with RVR.  Patient had right periorbital swelling ongoing for about 3 to 4 days, matting of the right eye lids.  She was taken to ophthalmology office, routine test showed A. fib with RVR heart rate more than 110 and sent to ER. In the emergency room patient was hemodynamically stable.  A. fib with RVR, heart rate 110s, urinalysis normal.  CT head normal.  Creatinine 1.4.  Mild elevated LFTs.  Blood glucose was 207.  Patient was treated for periorbital cellulitis with IV antibiotics and admitted to the hospital.   Assessment & Plan:   Principal Problem:   Herpes zoster ophthalmicus of right eye Active Problems:   DM type 2 (diabetes mellitus, type 2) (HCC)   HTN (hypertension), benign   Psoriatic arthritis (HCC)   Asthma   Elevated LFTs   Atrial fibrillation (HCC)   Chronic pain syndrome   Opioid dependence (HCC)   Constipation due to opioid therapy   Iatrogenic hyperthyroidism  Herpes zoster ophthalmicus of the right eye: Antibiotics stopped. Acyclovir for 10 days, erythromycin eyedrops as per ophthalmology recommendation. Airborne isolation until active lesions as per infectious disease protocol. Follow-up with ophthalmology next week after discharge.  New onset A. fib with RVR: Started on metoprolol.  Rate optimally controlled in the morning.  Increased dose of metoprolol to 25 mg twice a day with better rate control. CHA2DS2-VASc score of 5, 2D echocardiogram was normal. HAS-BLED score of 2.  Benefit outweighs  risk. Discussed with patient and daughter, started on Eliquis.  Chronic pain syndrome: On fentanyl patch that she will continue.  Type 2 diabetes on insulin, uncontrolled with hypoglycemia: Had hypoglycemic episode in the morning.  Currently resolved.  Adequate diet.  Resume insulin.  Hypertension: Blood pressures are stable.  Hypothyroidism: On Synthroid 125.  TSH 0.09, over suppressed.  Dose is decreased 200 mcg.  Recent COVID-19 infection: Outside the isolation window.  DVT prophylaxis: Eliquis Code Status: DNR Family Communication: Patient's daughter, Ms. [name] outside the room Disposition Plan: patient is from long-term nursing home. Anticipated DC to long-term nursing home, Barriers to discharge suboptimally controlled heart rate   Consultants:   None  Procedures:   None  Antimicrobials:  Anti-infectives (From admission, onward)   Start     Dose/Rate Route Frequency Ordered Stop   04/24/19 1530  vancomycin (VANCOREADY) IVPB 1500 mg/300 mL  Status:  Discontinued     1,500 mg 150 mL/hr over 120 Minutes Intravenous Every 36 hours 04/23/19 0829 04/23/19 1243   04/24/19 0000  valACYclovir (VALTREX) 1000 MG tablet     1,000 mg Oral 3 times daily 04/24/19 0827 05/01/19 2359   04/23/19 2000  cefTRIAXone (ROCEPHIN) 2 g in sodium chloride 0.9 % 100 mL IVPB  Status:  Discontinued     2 g 200 mL/hr over 30 Minutes Intravenous Every 24 hours 04/23/19 0110 04/23/19 1242   04/23/19 1600  valACYclovir (VALTREX) tablet 1,000 mg  Status:  Discontinued     1,000 mg Oral 3 times daily 04/23/19 1232 04/23/19 1257   04/23/19 1400  acyclovir (ZOVIRAX) 915 mg in dextrose 5 % 150 mL IVPB  Status:  Discontinued     10 mg/kg  91.5 kg (Adjusted) 168.3 mL/hr over 60 Minutes Intravenous Every 8 hours 04/23/19 1124 04/23/19 1230   04/23/19 1300  valACYclovir (VALTREX) tablet 1,000 mg     1,000 mg Oral 3 times daily 04/23/19 1257     04/23/19 0145  vancomycin (VANCOCIN) IVPB 1000 mg/200 mL  premix     1,000 mg 200 mL/hr over 60 Minutes Intravenous  Once 04/23/19 0131 04/23/19 0436   04/22/19 2030  vancomycin (VANCOCIN) IVPB 1000 mg/200 mL premix     1,000 mg 200 mL/hr over 60 Minutes Intravenous  Once 04/22/19 2029 04/22/19 2303   04/22/19 2030  cefTRIAXone (ROCEPHIN) 2 g in sodium chloride 0.9 % 100 mL IVPB     2 g 200 mL/hr over 30 Minutes Intravenous  Once 04/22/19 2029 04/22/19 2143         Subjective: Patient was seen and examined.  Early morning rounds, she wanted to get out of the bed and go to bathroom by herself.  She was not quite happy with not letting her walk. Telemetry showed a heart rate of 120-140 A. Fib. Blood sugars less than 60.  Objective: Vitals:   04/23/19 1731 04/23/19 2256 04/24/19 0618 04/24/19 0943  BP: (!) 136/97 (!) 118/55 (!) 156/73 (!) 133/58  Pulse: 88 71 93 84  Resp: 18 16 20    Temp: 98.7 F (37.1 C) 99.2 F (37.3 C) 98.2 F (36.8 C)   TempSrc: Oral Oral Oral   SpO2: 98% 95% 94%   Weight:      Height:        Intake/Output Summary (Last 24 hours) at 04/24/2019 1424 Last data filed at 04/24/2019 0900 Gross per 24 hour  Intake 600 ml  Output 1050 ml  Net -450 ml   Filed Weights   04/23/19 0224  Weight: 129.5 kg    Examination:  General exam: Appears anxious.  Not in any distress.  Chronically sick looking. Respiratory system: Clear to auscultation. Respiratory effort normal.  No added sounds. Cardiovascular system: S1 & S2 heard, irregularly irregular.  Gastrointestinal system: Abdomen is nondistended, soft and nontender.  Obese and pendulous. Central nervous system: Alert and oriented. No focal neurological deficits. Extremities: Symmetric 5 x 5 power.  Generalized weakness. Skin: No rashes, lesions or ulcers Psychiatry: Judgement and insight appear normal. Mood & affect flat and anxious.    Data Reviewed: I have personally reviewed following labs and imaging studies  CBC: Recent Labs  Lab 04/22/19 2051  04/23/19 0214 04/23/19 0658 04/24/19 0116  WBC 6.6 6.5 6.3 6.3  NEUTROABS 4.6  --  3.7  --   HGB 13.8 13.1 13.2 13.2  HCT 43.6 40.9 40.8 40.1  MCV 90.5 90.9 90.3 89.1  PLT 298 294 313 293   Basic Metabolic Panel: Recent Labs  Lab 04/22/19 2051 04/23/19 0214 04/23/19 0658 04/24/19 0116  NA 133*  --  134* 137  K 4.3  --  3.9 4.0  CL 91*  --  91* 91*  CO2 29  --  30 34*  GLUCOSE 207*  --  219* 90  BUN 16  --  14 11  CREATININE 1.42* 1.34* 1.30* 1.08*  CALCIUM 9.9  --  9.6 9.8  MG  --   --   --  1.8   GFR: Estimated Creatinine Clearance: 64 mL/min (A) (by C-G formula based on SCr of 1.08 mg/dL (  H)). Liver Function Tests: Recent Labs  Lab 04/22/19 2051 04/23/19 0658  AST 60* 43*  ALT 48* 40  ALKPHOS 533* 480*  BILITOT 1.7* 1.2  PROT 6.4* 5.8*  ALBUMIN 2.8* 2.5*   No results for input(s): LIPASE, AMYLASE in the last 168 hours. Recent Labs  Lab 04/22/19 2052  AMMONIA 30   Coagulation Profile: Recent Labs  Lab 04/22/19 2051  INR 1.1   Cardiac Enzymes: No results for input(s): CKTOTAL, CKMB, CKMBINDEX, TROPONINI in the last 168 hours. BNP (last 3 results) No results for input(s): PROBNP in the last 8760 hours. HbA1C: No results for input(s): HGBA1C in the last 72 hours. CBG: Recent Labs  Lab 04/23/19 2226 04/24/19 0758 04/24/19 0821 04/24/19 0941 04/24/19 1155  GLUCAP 122* 56* 54* 124* 101*   Lipid Profile: No results for input(s): CHOL, HDL, LDLCALC, TRIG, CHOLHDL, LDLDIRECT in the last 72 hours. Thyroid Function Tests: Recent Labs    04/23/19 0658  TSH 0.090*   Anemia Panel: No results for input(s): VITAMINB12, FOLATE, FERRITIN, TIBC, IRON, RETICCTPCT in the last 72 hours. Sepsis Labs: Recent Labs  Lab 04/22/19 2051  LATICACIDVEN 1.3    Recent Results (from the past 240 hour(s))  Blood Culture (routine x 2)     Status: None (Preliminary result)   Collection Time: 04/22/19  8:51 PM   Specimen: BLOOD  Result Value Ref Range Status    Specimen Description BLOOD LEFT ANTECUBITAL  Final   Special Requests   Final    BOTTLES DRAWN AEROBIC AND ANAEROBIC Blood Culture adequate volume   Culture   Final    NO GROWTH 2 DAYS Performed at [facility], [address], [place], [place] [address]    Report Status PENDING  Incomplete  SARS CORONAVIRUS 2 (TAT 6-24 HRS) Nasopharyngeal Nasopharyngeal Swab     Status: Abnormal   Collection Time: 04/22/19 11:43 PM   Specimen: Nasopharyngeal Swab  Result Value Ref Range Status   SARS Coronavirus 2 POSITIVE (A) NEGATIVE Final    Comment: RESULT CALLED TO, READ BACK BY AND VERIFIED WITH: Adela Ports RN 9:30 04/23/19 ([email or web address]) (NOTE) SARS-CoV-2 target nucleic acids are DETECTED. The SARS-CoV-2 RNA is generally detectable in upper and lower respiratory specimens during the acute phase of infection. Positive results are indicative of the presence of SARS-CoV-2 RNA. Clinical correlation with patient history and other diagnostic information is  necessary to determine patient infection status. Positive results do not rule out bacterial infection or co-infection with other viruses.  The expected result is Negative. Fact Sheet for Patients: HairSlick.no Fact Sheet for Healthcare Providers: quierodirigir.com This test is not yet approved or cleared by the [place] FDA and  has been authorized for detection and/or diagnosis of SARS-CoV-2 by FDA under an Emergency Use Authorization (EUA). This EUA will remain  in effect (meaning this test can be used) for the du ration of the COVID-19 declaration under Section 564(b)(1) of the Act, 21 U.S.C. section 360bbb-3(b)(1), unless the authorization is terminated or revoked sooner. Performed at [facility], [address] [place], [place] [address]   Urine culture     Status: Abnormal (Preliminary result)   Collection Time: 04/23/19  1:10 AM   Specimen: In/Out Cath Urine  Result  Value Ref Range Status   Specimen Description IN/OUT CATH URINE  Final   Special Requests NONE  Final   Culture (A)  Final    2,000 COLONIES/mL ENTEROCOCCUS FAECALIS SUSCEPTIBILITIES TO FOLLOW Performed at [facility]  [facility], [address], [place], [place] [address]    Report Status PENDING  Incomplete  Blood Culture (routine x 2)     Status: None (Preliminary result)   Collection Time: 04/23/19  2:24 AM   Specimen: BLOOD RIGHT FOREARM  Result Value Ref Range Status   Specimen Description BLOOD RIGHT FOREARM  Final   Special Requests   Final    BOTTLES DRAWN AEROBIC AND ANAEROBIC Blood Culture adequate volume   Culture   Final    NO GROWTH 1 DAY Performed at [facility], [address] [place], [place] [address]    Report Status PENDING  Incomplete         Radiology Studies: CT Head Wo Contrast  Result Date: 04/22/2019 CLINICAL DATA:  Mental status change EXAM: CT HEAD WITHOUT CONTRAST TECHNIQUE: Contiguous axial images were obtained from the base of the skull through the vertex without intravenous contrast. COMPARISON:  None. FINDINGS: Brain: There is no acute intracranial hemorrhage, mass effect, or edema. Gray-white differentiation is preserved. Patchy hypoattenuation in the supratentorial white matter is nonspecific may reflect mild chronic microvascular ischemic changes. Prominence of the ventricles and sulci reflects minor generalized parenchymal volume loss. There is no extra-axial fluid collection. Vascular: There is intracranial atherosclerotic calcification at the skull base. Skull: Unremarkable. Sinuses/Orbits: Right periorbital soft tissue swelling. Right frontal sinus partial opacification. Other: Mastoid air cells are clear. IMPRESSION: No acute intracranial hemorrhage, mass effect, or evidence of acute infarction. Chronic microvascular ischemic changes. Right periorbital soft tissue swelling. Electronically Signed   By: Guadlupe Spanish M.D.   On: 04/22/2019 21:53    US Abdomen Complete  Result Date: 04/23/2019 CLINICAL DATA:  Elevated LFTs EXAM: ABDOMEN ULTRASOUND COMPLETE COMPARISON:  None. FINDINGS: Gallbladder: No gallstones or wall thickening visualized. No sonographic Murphy sign noted by sonographer. Common bile duct: Diameter: Could not be visualized prior to the termination of the procedure at the request of the patient. Liver: Diffusely increased in echogenicity likely related to fatty infiltration. Portal vein is not visualized IVC: No abnormality visualized. Pancreas: Not visualized Spleen: Size and appearance within normal limits. Right Kidney: Length: 9.9 cm. Echogenicity within normal limits. No mass or hydronephrosis visualized. Left Kidney: Length: Not visualized Abdominal aorta: No aneurysm visualized. Other findings: None. IMPRESSION: Early termination of the procedure at the patient's request. Above-noted nonvisualized structures are related to the termination of the procedure. Fatty infiltration of the liver No other focal abnormality is noted. Electronically Signed   By: Alcide Clever M.D.   On: 04/23/2019 00:04   DG Chest Port 1 View  Result Date: 04/22/2019 CLINICAL DATA:  79 year old female with weakness. EXAM: PORTABLE CHEST 1 VIEW COMPARISON:  Chest radiograph dated 04/13/2016. FINDINGS: There is cardiomegaly with vascular congestion. No focal consolidation, pleural effusion, pneumothorax. There is diffuse interstitial coarsening, chronic. No acute osseous pathology. IMPRESSION: 1. Cardiomegaly with mild vascular congestion. No focal consolidation. 2. Chronic interstitial coarsening and bronchitic changes. Electronically Signed   By: Elgie Collard M.D.   On: 04/22/2019 21:25   ECHOCARDIOGRAM COMPLETE  Result Date: 04/23/2019   ECHOCARDIOGRAM REPORT   Patient Name:   ALLICIA CULLEY Date of Exam: 04/23/2019 Medical Rec #:  [id number]        Height:       69.0 in Accession #:    [id number]       Weight:       285.5 lb Date of Birth:  [birth year]         BSA:  2.40 m Patient Age:    78 years         BP:           110/48 mmHg Patient Gender: F                HR:           105 bpm. Exam Location:  Inpatient Procedure: 2D Echo, Cardiac Doppler and Color Doppler Indications:    I48.91* Unspeicified atrial fibrillation  History:        Patient has no prior history of Echocardiogram examinations.                 Abnormal ECG, Arrythmias:Atrial Fibrillation; Risk                 Factors:Hypertension and Diabetes. Covid 19 positive. Opioid                 use. Pneumonia.  Sonographer:    Sheralyn Boatman RDCS Referring Phys: [phone] Meryle Ready [employer]  Sonographer Comments: Patient is morbidly obese and Technically difficult study due to poor echo windows. Image acquisition challenging due to patient body habitus and Image acquisition challenging due to uncooperative patient. Exam ended because patient  was screaming and grabbed probe. Patient complained of severe head pain. RN was notified. Patient could not tolerate study. IMPRESSIONS  1. Left ventricular ejection fraction, by visual estimation, is 60 to 65%. The left ventricle has normal function. There is no left ventricular hypertrophy.  2. Left ventricular diastolic parameters are indeterminate.  3. The left ventricle has no regional wall motion abnormalities.  4. Global right ventricle has normal systolic function.The right ventricular size is normal. No increase in right ventricular wall thickness.  5. Left atrial size was moderately dilated.  6. Right atrial size was normal.  7. Moderate mitral annular calcification.  8. The mitral valve is degenerative. No evidence of mitral valve regurgitation. No evidence of mitral stenosis.  9. The tricuspid valve is normal in structure. 10. The tricuspid valve is normal in structure. Tricuspid valve regurgitation is not demonstrated. 11. The aortic valve is tricuspid. Aortic valve regurgitation is not visualized. Mild aortic valve sclerosis without stenosis. 12. The  pulmonic valve was normal in structure. Pulmonic valve regurgitation is not visualized. 13. The inferior vena cava is normal in size with greater than 50% respiratory variability, suggesting right atrial pressure of 3 mmHg. 14. Limited echo due to patient termination pain and patient clothed limited apical images and no subcostal imaging. 15. The interatrial septum was not assessed. FINDINGS  Left Ventricle: Left ventricular ejection fraction, by visual estimation, is 60 to 65%. The left ventricle has normal function. The left ventricle has no regional wall motion abnormalities. There is no left ventricular hypertrophy. Left ventricular diastolic parameters are indeterminate. Normal left atrial pressure. Right Ventricle: The right ventricular size is normal. No increase in right ventricular wall thickness. Global RV systolic function is has normal systolic function. Left Atrium: Left atrial size was moderately dilated. Right Atrium: Right atrial size was normal in size Pericardium: There is no evidence of pericardial effusion. Mitral Valve: The mitral valve is degenerative in appearance. There is mild thickening of the mitral valve leaflet(s). There is mild calcification of the mitral valve leaflet(s). Moderate mitral annular calcification. No evidence of mitral valve regurgitation. No evidence of mitral valve stenosis by observation. Tricuspid Valve: The tricuspid valve is normal in structure. Tricuspid valve regurgitation is not demonstrated. Aortic Valve: The aortic valve is tricuspid.  Aortic valve regurgitation is not visualized. Mild aortic valve sclerosis is present, with no evidence of aortic valve stenosis. Pulmonic Valve: The pulmonic valve was normal in structure. Pulmonic valve regurgitation is not visualized. Pulmonic regurgitation is not visualized. Aorta: The aortic root, ascending aorta and aortic arch are all structurally normal, with no evidence of dilitation or obstruction. Venous: The inferior  vena cava is normal in size with greater than 50% respiratory variability, suggesting right atrial pressure of 3 mmHg. IAS/Shunts: The interatrial septum was not assessed. There is no evidence of a patent foramen ovale. No ventricular septal defect is seen or detected. There is no evidence of an atrial septal defect. Additional Comments: Limited echo due to patient termination pain and patient clothed limited apical images and no subcostal imaging.  LEFT VENTRICLE PLAX 2D LVIDd:         5.00 cm LVIDs:         4.30 cm LV PW:         1.20 cm LV IVS:        0.90 cm LVOT diam:     1.90 cm LV SV:         35 ml LV SV Index:   13.67 LVOT Area:     2.84 cm  LEFT ATRIUM           Index LA diam:      4.40 cm 1.83 cm/m LA Vol (A4C): 56.6 ml 23.55 ml/m   AORTA Ao Root diam: 3.30 cm Ao Asc diam:  3.50 cm  SHUNTS Systemic Diam: 1.90 cm  Jenkins Rouge MD Electronically signed by Jenkins Rouge MD Signature Date/Time: 04/23/2019/11:27:11 AM    Final         Scheduled Meds: . apixaban  5 mg Oral BID  . DULoxetine  120 mg Oral Daily  . erythromycin   Right Eye TID  . fentaNYL  1 patch Transdermal Q72H  . furosemide  40 mg Oral Q1400  . insulin aspart  0-9 Units Subcutaneous TID WC  . insulin detemir  20 Units Subcutaneous BID  . lamoTRIgine  25 mg Oral Q1400  . levothyroxine  100 mcg Oral Q0600  . metoprolol tartrate  25 mg Oral BID  . mometasone-formoterol  2 puff Inhalation BID  . montelukast  10 mg Oral Q1400  . polyethylene glycol  17 g Oral Q72H  . rOPINIRole  0.5 mg Oral QHS  . senna  1 tablet Oral Q1400  . traZODone  100 mg Oral QHS  . valACYclovir  1,000 mg Oral TID   Continuous Infusions:   LOS: 1 day    Time spent: 25 minutes    Barb Merino, MD [facility] Pager [phone]

## 2019-04-24 NOTE — Discharge Instructions (Signed)

## 2019-04-24 NOTE — TOC Initial Note (Signed)
Transition of Care [facility]) - Initial/Assessment Note    Patient Details  Name: Ashley Caldwell MRN: [id number] Date of Birth: 01-26-1941  Transition of Care [facility]) CM/SW Contact:    Alexander Mt, [place] Phone Number: 04/24/2019, 5:40 PM  Clinical Narrative:                 CSW spoke with pt daughter [name] on the phone. Introduced self, role, reason for visit. Pt from [facility] where she has lived for 3 years. Pt daughter excited to see her as due to [place] restrictions she hasnt seen her in over a year. Deferred questions regarding medical plan to RN and MD staff. Let pt daughter know that we would support pt and pt family with transition back to SNF when medically ready. Of note pt in pressurized room; will need to be off those restrictions prior to return to SNF.   Expected Discharge Plan: [place] Barriers to Discharge: Continued Medical Work up   Patient Goals and CMS Choice Patient states their goals for this hospitalization and ongoing recovery are:: return to [employer].gov Compare Post Acute Care list provided to:: Patient Represenative (must comment)(pt daughter) Choice offered to / list presented to : Adult Children  Expected Discharge Plan and Services Expected Discharge Plan: [employer] In-house Referral: Clinical Social Work Discharge Planning Services: CM Consult Post Acute Care Choice: Resumption of Svcs/PTA Provider, [place] Living arrangements for the past 2 months: [place]         Prior Living Arrangements/Services Living arrangements for the past 2 months: [place] Lives with:: Facility Resident Patient language and need for interpreter reviewed:: Yes(no needs) Do you feel safe going back to the place where you live?: Yes      Need for Family Participation in Patient Care: Yes (Comment)(assist with daily cares; support) Care giver support system in place?: Yes  (comment)(facility staff; residents)   Criminal Activity/Legal Involvement Pertinent to Current Situation/Hospitalization: No - Comment as needed  Activities of Daily Living Home Assistive Devices/Equipment: Eyeglasses, Wheelchair ADL Screening (condition at time of admission) Patient's cognitive ability adequate to safely complete daily activities?: Yes Is the patient deaf or have difficulty hearing?: No Does the patient have difficulty seeing, even when wearing glasses/contacts?: No Does the patient have difficulty concentrating, remembering, or making decisions?: No Patient able to express need for assistance with ADLs?: Yes Does the patient have difficulty dressing or bathing?: Yes Independently performs ADLs?: No Communication: Independent Dressing (OT): Dependent Is this a change from baseline?: Pre-admission baseline Grooming: Dependent Is this a change from baseline?: Pre-admission baseline Feeding: Independent Bathing: Dependent Is this a change from baseline?: Pre-admission baseline Toileting: Dependent Is this a change from baseline?: Pre-admission baseline In/Out Bed: Dependent Is this a change from baseline?: Pre-admission baseline Walks in Home: Dependent Is this a change from baseline?: Pre-admission baseline Does the patient have difficulty walking or climbing stairs?: Yes Weakness of Legs: Both Weakness of Arms/Hands: None  Permission Sought/Granted Permission sought to share information with : Facility Sport and exercise psychologist, Family Supports Permission granted to share information with : No(pt with fluctuating orientation)  Share Information with NAME: Christyann Manolis  Permission granted to share info w AGENCY: [place] granted to share info w Relationship: daughter     Emotional Assessment Appearance:: Other (Comment Required(telephonic assessment) Attitude/Demeanor/Rapport: (telephonic assesssment) Affect (typically observed): (telephonic  assessment) Orientation: : Oriented to Self, Oriented to Place, Fluctuating Orientation (Suspected and/or reported Sundowners) Alcohol /  Substance Use: Not Applicable Psych Involvement: No (comment)  Admission diagnosis:  Periorbital cellulitis [L03.213] Elevated LFTs [R79.89] Periorbital cellulitis of right eye [L03.213] Atrial fibrillation, unspecified type [facility]) [I48.91] Patient Active Problem List   Diagnosis Date Noted  . Elevated LFTs 04/23/2019  . Atrial fibrillation (HCC) 04/23/2019  . Chronic pain syndrome 04/23/2019  . Opioid dependence (HCC) 04/23/2019  . Constipation due to opioid therapy 04/23/2019  . 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  . 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  . High risk medication use 06/12/2016  . Closed fracture of right proximal humerus 05/01/2016  . History of renal failure 04/26/2016  . Primary osteoarthritis of both knees 04/26/2016  . Renal failure 04/13/2016  . Acute renal failure (HCC) 04/13/2016  . Psoriasis 01/18/2016  . Fibromyalgia 01/18/2016  . DDD (degenerative disc disease), lumbar 01/18/2016  . Osteoarthritis of both knees 01/18/2016  . Elevated cholesterol 01/18/2016  . Hypoglycemia associated with diabetes (HCC) 01/16/2011  . Compression fracture of vertebral column (HCC) 01/16/2011  . Hyponatremia 01/14/2011  . Pneumonia 01/13/2011  . Hyperglycemia 01/13/2011  . Weakness generalized 01/13/2011  . DM type 2 (diabetes mellitus, type 2) (HCC) 01/13/2011  . HTN (hypertension), benign 01/13/2011  . Psoriatic arthritis (HCC) 01/13/2011  . Anemia 01/13/2011  . Thyroiditis, chronic 01/13/2011  . Morbid obesity (HCC) 01/13/2011  . Asthma 01/13/2011   PCP:  Benita Stabile, MD Pharmacy:   [facility] [place], [place] - [address] [place] [address] Phone: [phone] Fax: [phone]   Readmission Risk Interventions Readmission Risk Prevention Plan 04/23/2019   Transportation Screening Complete  PCP or Specialist Appt within 3-5 Days Not Complete  Not Complete comments SNF resident  [facility] or Home Care Consult Not Complete  HRI or Home Care Consult comments SNF resident  Social Work Consult for Recovery Care Planning/Counseling Complete  Palliative Care Screening Not Applicable  Medication Review (RN Care Manager) Referral to Pharmacy  Some recent data might be hidden

## 2019-04-24 NOTE — Social Work (Addendum)
1:16pm- CSW spoke with [name] at [facility] she will reschedule her appointment. Was able to speak with pt daughter Ashley Caldwell via telephone at [phone]  11:24am- CSW had discussed case with MD Dr. Laural Benes, he was aware that CSW and SNF could not coordinate appointment today. Dr. Jerral Ralph has asked this writer about rescheduling appointment. CSW will have to coordinate this with pt SNF as she is a LTC resident and they will provide transport for pt. Will communicate this to [place], SNF liaison.   CSW has also called pt daughter again, she had left message for this writer yesterday evening after shift end. No answer, pt daughter reports on voicemail from 2/4 she has challenges with hearing and this Clinical research associate also sent HIPAA compliant text to alert her of CSW call.   Octavio Graves, MSW, LCSW [facility] Clinical Social Work

## 2019-04-24 NOTE — Evaluation (Signed)
Physical Therapy Evaluation Patient Details Name: MELI FALEY MRN: [id number] DOB: 20-Aug-1940 Today's Date: 04/24/2019   History of Present Illness  STARLENA BEIL is a 79 y.o. female with history of diabetes mellitus type 2, hypertension, fibromyalgia hypothyroidism morbid obesity on chronic pain medications was referred to the ER the patient had gone to ophthalmologist office.  Patient also was found to be in A. fib with RVR.  Patient states over the last 3 to 4 days patient has been having swelling of the right periorbital area.  Is able to see through the but swelling has been progressive worsening with some matting of the eye.  Denies any fever chills.  Denies any significant pain around the swelling of the eyes.  Patient has generalized body ache.  Clinical Impression  Patient received in bed, daughter present, crying that she is in so much pain, specifically her heels. I repositioned her with towel rolls under her heels which she reports is improvement.  Patient reports B knee pain, demonstrates gross weakness throughout all extremities. She/daughter reports she has not really been out of bed since prior to having Covid over a month ago.  Patient would require max assist +2 at this time to safely attempt out of bed activities or to attempt sitting up on the side of the bed. She will continue to benefit from skilled PT while here to improve strength and functional independence to reduce caregiver burden.      Follow Up Recommendations SNF    Equipment Recommendations  None recommended by PT(patient has walker and wheelchair)    Recommendations for Other Services       Precautions / Restrictions Precautions Precautions: Fall Restrictions Weight Bearing Restrictions: No      Mobility  Bed Mobility Overal bed mobility: Needs Assistance Bed Mobility: Rolling Rolling: Max assist         General bed mobility comments: patient limited this session by pain. Crying that she is  just in so much pain.  Transfers                 General transfer comment: not attempted  Ambulation/Gait             General Gait Details: not attempted this session due to reported pain  Stairs            Wheelchair Mobility    Modified Rankin (Stroke Patients Only)       Balance                                             Pertinent Vitals/Pain Pain Assessment: 0-10 Pain Score: 10-Worst pain ever Pain Location: B heels, hurts all over Pain Descriptors / Indicators: Crying Pain Intervention(s): Repositioned;RN gave pain meds during session;Limited activity within patient's tolerance;Monitored during session    Home Living Family/patient expects to be discharged to:: Skilled nursing facility                      Prior Function Level of Independence: Needs assistance   Gait / Transfers Assistance Needed: Patient has not really been out of the bed since before she got Covid which was over a month ago. Prior to that she states she could ambulate to her bathroom with a walker.  ADL's / Homemaking Assistance Needed: Lives in skilled nursing facility, therefore has assist  Hand Dominance        Extremity/Trunk Assessment   Upper Extremity Assessment Upper Extremity Assessment: Generalized weakness    Lower Extremity Assessment Lower Extremity Assessment: Generalized weakness       Communication   Communication: No difficulties  Cognition Arousal/Alertness: Awake/alert Behavior During Therapy: WFL for tasks assessed/performed Overall Cognitive Status: Difficult to assess                                 General Comments: difficult to assess due to patient crying, distracted      General Comments      Exercises     Assessment/Plan    PT Assessment Patient needs continued PT services  PT Problem List Decreased strength;Decreased activity tolerance;Decreased balance;Decreased  mobility;Decreased cognition;Pain;Decreased safety awareness;Obesity       PT Treatment Interventions Functional mobility training;Therapeutic activities;Balance training;Therapeutic exercise;Patient/family education    PT Goals (Current goals can be found in the Care Plan section)  Acute Rehab PT Goals Patient Stated Goal: to stop hurting so much PT Goal Formulation: With patient Time For Goal Achievement: 05/08/19 Potential to Achieve Goals: Fair    Frequency Min 2X/week   Barriers to discharge        Co-evaluation               AM-PAC PT "6 Clicks" Mobility  Outcome Measure Help needed turning from your back to your side while in a flat bed without using bedrails?: Total Help needed moving from lying on your back to sitting on the side of a flat bed without using bedrails?: Total Help needed moving to and from a bed to a chair (including a wheelchair)?: Total Help needed standing up from a chair using your arms (e.g., wheelchair or bedside chair)?: Total Help needed to walk in hospital room?: Total Help needed climbing 3-5 steps with a railing? : Total 6 Click Score: 6    End of Session   Activity Tolerance: Patient limited by pain Patient left: in bed;with call bell/phone within reach;with family/visitor present Nurse Communication: Mobility status PT Visit Diagnosis: Other abnormalities of gait and mobility (R26.89);Muscle weakness (generalized) (M62.81);Difficulty in walking, not elsewhere classified (R26.2);Pain Pain - Right/Left: (all over) Pain - part of body: Ankle and joints of foot;Knee    Time: 1500-1520 PT Time Calculation (min) (ACUTE ONLY): 20 min   Charges:   PT Evaluation $PT Eval Moderate Complexity: 1 Mod          Laycee Fitzsimmons, PT, GCS 04/24/19,3:47 PM

## 2019-04-25 LAB — GLUCOSE, CAPILLARY
Glucose-Capillary: 60 mg/dL — ABNORMAL LOW (ref 70–99)
Glucose-Capillary: 77 mg/dL (ref 70–99)
Glucose-Capillary: 128 mg/dL — ABNORMAL HIGH (ref 70–99)
Glucose-Capillary: 363 mg/dL — ABNORMAL HIGH (ref 70–99)
Glucose-Capillary: 376 mg/dL — ABNORMAL HIGH (ref 70–99)

## 2019-04-25 LAB — BASIC METABOLIC PANEL
Anion gap: 9 (ref 5–15)
BUN: 12 mg/dL (ref 8–23)
CO2: 30 mmol/L (ref 22–32)
Calcium: 9 mg/dL (ref 8.9–10.3)
Chloride: 92 mmol/L — ABNORMAL LOW (ref 98–111)
Creatinine, Ser: 1.1 mg/dL — ABNORMAL HIGH (ref 0.44–1.00)
GFR calc Af Amer: 56 mL/min — ABNORMAL LOW (ref >60)
GFR calc non Af Amer: 49 mL/min — ABNORMAL LOW (ref >60)
Glucose, Bld: 75 mg/dL (ref 70–99)
Potassium: 3.8 mmol/L (ref 3.5–5.1)
Sodium: 131 mmol/L — ABNORMAL LOW (ref 135–145)

## 2019-04-25 LAB — URINE CULTURE: Culture: 2000 — AB

## 2019-04-25 LAB — CBC
HCT: 38.6 % (ref 36.0–46.0)
Hemoglobin: 12.8 g/dL (ref 12.0–15.0)
MCH: 29.1 pg (ref 26.0–34.0)
MCHC: 33.2 g/dL (ref 30.0–36.0)
MCV: 87.7 fL (ref 80.0–100.0)
Platelets: 274 10*3/uL (ref 150–400)
RBC: 4.4 MIL/uL (ref 3.87–5.11)
RDW: 14.8 % (ref 11.5–15.5)
WBC: 7.1 10*3/uL (ref 4.0–10.5)
nRBC: 0 % (ref 0.0–0.2)

## 2019-04-25 LAB — MAGNESIUM: Magnesium: 1.7 mg/dL (ref 1.7–2.4)

## 2019-04-25 LAB — PHOSPHORUS: Phosphorus: 3 mg/dL (ref 2.5–4.6)

## 2019-04-25 MED ORDER — DEXTROSE 5 % IV SOLN
10.0000 mg/kg | Freq: Three times a day (TID) | INTRAVENOUS | Status: DC
  Administered 2019-04-25 – 2019-04-27 (×6): 915 mg via INTRAVENOUS
  Filled 2019-04-25 (×8): qty 18.3

## 2019-04-25 MED ORDER — PREDNISOLONE ACETATE 1 % OP SUSP: 1.0000 [drp] | OPHTHALMIC | Status: DC

## 2019-04-25 MED ORDER — PREDNISOLONE ACETATE 1 % OP SUSP
1.0000 [drp] | Freq: Four times a day (QID) | OPHTHALMIC | Status: DC
  Administered 2019-04-25 – 2019-04-27 (×9): 1 [drp] via OPHTHALMIC
  Filled 2019-04-25 (×2): qty 5

## 2019-04-25 MED ORDER — PREDNISONE 50 MG PO TABS
60.0000 mg | ORAL_TABLET | Freq: Every day | ORAL | Status: DC
  Administered 2019-04-25 – 2019-04-26 (×2): 60 mg via ORAL
  Filled 2019-04-25 (×2): qty 1

## 2019-04-25 NOTE — Progress Notes (Signed)
Physical Therapy Treatment Patient Details Name: Ashley Caldwell MRN: [id number] DOB: 28-Sep-1940 Today's Date: 04/25/2019    History of Present Illness Ashley Caldwell is a 79 y.o. female with history of diabetes mellitus type 2, hypertension, fibromyalgia hypothyroidism morbid obesity on chronic pain medications was referred to the ER the patient had gone to ophthalmologist office.  Patient also was found to be in A. fib with RVR.  Patient states over the last 3 to 4 days patient has been having swelling of the right periorbital area.  Is able to see through the but swelling has been progressive worsening with some matting of the eye.  Denies any fever chills.  Denies any significant pain around the swelling of the eyes.  Patient has generalized body ache.    PT Comments    Patient is making progress toward PT goals. Pt able to transfer OOB this session with mod A +2. Pt limited by painful feet. Pt labile throughout session and internally distracted but easily redirected to mobility tasks and very pleasant. Continue to progress as tolerated with anticipated d/c to SNF for further skilled PT services.      Follow Up Recommendations  SNF     Equipment Recommendations  None recommended by PT(patient has walker and wheelchair)    Recommendations for Other Services       Precautions / Restrictions Precautions Precautions: Fall Precaution Comments: chronic L UE pain/weakness from humeral fx  Restrictions Weight Bearing Restrictions: No    Mobility  Bed Mobility Overal bed mobility: Needs Assistance Bed Mobility: Supine to Sit     Supine to sit: Mod assist;+2 for physical assistance;HOB elevated     General bed mobility comments: use of rail; cues for sequencing/technique; assist to bring hips to EOB with bed pad and to elevate trunk into sitting  Transfers Overall transfer level: Needs assistance Equipment used: Rolling walker (2 wheeled);2 person hand held assist Transfers:  Sit to/from [facility] Sit to Stand: Mod assist;+2 physical assistance Stand pivot transfers: Mod assist;+2 physical assistance       General transfer comment: pt stood 2 from EOB with use of RW and then third trial able to pivot to recliner with +2 HHA and gait belt; cues for safe hand placement; pt does well with counting to 3 and using momentum  Ambulation/Gait                 Stairs             Wheelchair Mobility    Modified Rankin (Stroke Patients Only)       Balance Overall balance assessment: Needs assistance   Sitting balance-Leahy Scale: Fair       Standing balance-Leahy Scale: Poor                              Cognition Arousal/Alertness: Awake/alert Behavior During Therapy: Restless(labile) Overall Cognitive Status: No family/caregiver present to determine baseline cognitive functioning                                 General Comments: pt following single step cues consistently but with increased time due to spontaneous emotions and crying at times; pt internally distracted but able to redirect to tasks and appreciative of staff       Exercises      General Comments General comments (skin integrity, edema, etc.): HR  into 120s with mobility; noted redness and bump/blister on lateral side of L foot but both feet very painful      Pertinent Vitals/Pain Pain Assessment: Faces Faces Pain Scale: Hurts even more Pain Location: lateral sides of feet and "skin" Pain Descriptors / Indicators: Grimacing;Guarding;Moaning;Crying Pain Intervention(s): Limited activity within patient's tolerance;Monitored during session;Repositioned;Patient requesting pain meds-RN notified;RN gave pain meds during session    Home Living                      Prior Function            PT Goals (current goals can now be found in the care plan section) Progress towards PT goals: Progressing toward goals     Frequency    Min 2X/week      PT Plan Current plan remains appropriate    Co-evaluation PT/OT/SLP Co-Evaluation/Treatment: Yes Reason for Co-Treatment: Necessary to address cognition/behavior during functional activity;For patient/therapist safety;To address functional/ADL transfers PT goals addressed during session: Mobility/safety with mobility        AM-PAC PT "6 Clicks" Mobility   Outcome Measure  Help needed turning from your back to your side while in a flat bed without using bedrails?: A Lot Help needed moving from lying on your back to sitting on the side of a flat bed without using bedrails?: A Lot Help needed moving to and from a bed to a chair (including a wheelchair)?: A Lot Help needed standing up from a chair using your arms (e.g., wheelchair or bedside chair)?: A Lot Help needed to walk in hospital room?: Total Help needed climbing 3-5 steps with a railing? : Total 6 Click Score: 10    End of Session Equipment Utilized During Treatment: Gait belt;Oxygen Activity Tolerance: Patient limited by pain;Patient tolerated treatment well Patient left: with call bell/phone within reach;in chair Nurse Communication: Mobility status PT Visit Diagnosis: Other abnormalities of gait and mobility (R26.89);Muscle weakness (generalized) (M62.81);Difficulty in walking, not elsewhere classified (R26.2);Pain Pain - Right/Left: (all over) Pain - part of body: Ankle and joints of foot;Knee     Time: 0833-0922 PT Time Calculation (min) (ACUTE ONLY): 49 min  Charges:  $Gait Training: 23-37 mins                     Erline Levine, PTA Acute Rehabilitation Services Pager: [phone] Office: [phone]     Ashley Caldwell 04/25/2019, 9:43 AM

## 2019-04-25 NOTE — Progress Notes (Signed)
PROGRESS NOTE    Ashley Caldwell  OXB:[id number] DOB: Nov 15, 1940 DOA: 04/22/2019 PCP: Benita Stabile, MD    Brief Narrative:  From long-term nursing home.  Recent diagnosis of COVID-19 on 03/22/2019 at the nursing home.  She has a history of type 2 diabetes on insulin, hypertension, fibromyalgia, hypothyroidism and morbid obesity, chronic pain syndrome on chronic opiates with pain management sent to the emergency room from ophthalmologist office where she was found with A. fib with RVR.  Patient had right periorbital swelling ongoing for about 3 to 4 days, matting of the right eye lids.  She was taken to ophthalmology office, routine test showed A. fib with RVR heart rate more than 110 and sent to ER. In the emergency room patient was hemodynamically stable.  A. fib with RVR, heart rate 110s, urinalysis normal.  CT head normal.  Creatinine 1.4.  Mild elevated LFTs.  Blood glucose was 207.  Patient was treated for periorbital cellulitis with IV antibiotics and admitted to the hospital.   Assessment & Plan:   Principal Problem:   Herpes zoster ophthalmicus of right eye Active Problems:   DM type 2 (diabetes mellitus, type 2) (HCC)   HTN (hypertension), benign   Psoriatic arthritis (HCC)   Asthma   Elevated LFTs   Atrial fibrillation (HCC)   Chronic pain syndrome   Opioid dependence (HCC)   Constipation due to opioid therapy   Iatrogenic hyperthyroidism  Herpes zoster ophthalmicus of the right eye: Initially admitted for periorbital cellulitis, however clinical picture consistent with herpes zoster ophthalmicus with involvement of eyes. Case was discussed with ophthalmology by Dr. Laural Benes, she was started on oral acyclovir and erythromycin eyedrops. Patient continues to have severe symptoms, her lesions are worsening today.  Patient is not able to see through her right eye.  We will treat this as severe symptomatic her pictures to ophthalmicus. Change to IV acyclovir, continue erythromycin  eyedrops, start on prednisone eyedrops. Patient may or may not benefit, however there is some evidence of positive results with systemic steroids.  We will start patient on prednisone 60 mg daily with slow taper and monitoring her blood sugars closely. Airborne isolation until active lesions as per infectious disease protocol. Follow-up with ophthalmology next week after discharge.  New onset A. fib with RVR: Heart rate is better controlled on metoprolol.  Still remains as A. fib.  Since she is not very symptomatic, will continue current dose of metoprolol.  CHA2DS2-VASc score of 5, 2D echocardiogram was normal. HAS-BLED score of 2.  Benefit outweighs risk. Discussed with patient and daughter, started on Eliquis.  Chronic pain syndrome: On fentanyl patch that she will continue.  Type 2 diabetes on insulin, uncontrolled with hypoglycemia: Patient had a hypoglycemic episode in the hospital that is currently improved.   Continue long-acting insulin and mealtime coverage.  Starting on the steroids.  Will watch closely and uptitrate as needed.   Hypertension: Blood pressures are stable.  Hypothyroidism: On Synthroid 125.  TSH 0.09, over suppressed.  Dose is decreased 200 mcg.  Recent COVID-19 infection: Outside the isolation window.  DVT prophylaxis: Eliquis Code Status: DNR Family Communication: Patient's daughter, Ms. [name] at bedside. Disposition Plan: patient is from long-term nursing home. Anticipated DC to long-term nursing home, Barriers to discharge worsening herpes zoster lesions needing IV antiviral medications and monitoring.   Consultants:   None  Procedures:   None  Antimicrobials:  Anti-infectives (From admission, onward)   Start     Dose/Rate Route Frequency Ordered  Stop   04/25/19 1130  acyclovir (ZOVIRAX) 915 mg in dextrose 5 % 150 mL IVPB     10 mg/kg  91.5 kg (Adjusted) 168.3 mL/hr over 60 Minutes Intravenous Every 8 hours 04/25/19 1033     04/24/19 1530   vancomycin (VANCOREADY) IVPB 1500 mg/300 mL  Status:  Discontinued     1,500 mg 150 mL/hr over 120 Minutes Intravenous Every 36 hours 04/23/19 0829 04/23/19 1243   04/24/19 0000  valACYclovir (VALTREX) 1000 MG tablet     1,000 mg Oral 3 times daily 04/24/19 0827 05/01/19 2359   04/23/19 2000  cefTRIAXone (ROCEPHIN) 2 g in sodium chloride 0.9 % 100 mL IVPB  Status:  Discontinued     2 g 200 mL/hr over 30 Minutes Intravenous Every 24 hours 04/23/19 0110 04/23/19 1242   04/23/19 1600  valACYclovir (VALTREX) tablet 1,000 mg  Status:  Discontinued     1,000 mg Oral 3 times daily 04/23/19 1232 04/23/19 1257   04/23/19 1400  acyclovir (ZOVIRAX) 915 mg in dextrose 5 % 150 mL IVPB  Status:  Discontinued     10 mg/kg  91.5 kg (Adjusted) 168.3 mL/hr over 60 Minutes Intravenous Every 8 hours 04/23/19 1124 04/23/19 1230   04/23/19 1300  valACYclovir (VALTREX) tablet 1,000 mg  Status:  Discontinued     1,000 mg Oral 3 times daily 04/23/19 1257 04/25/19 1033   04/23/19 0145  vancomycin (VANCOCIN) IVPB 1000 mg/200 mL premix     1,000 mg 200 mL/hr over 60 Minutes Intravenous  Once 04/23/19 0131 04/23/19 0436   04/22/19 2030  vancomycin (VANCOCIN) IVPB 1000 mg/200 mL premix     1,000 mg 200 mL/hr over 60 Minutes Intravenous  Once 04/22/19 2029 04/22/19 2303   04/22/19 2030  cefTRIAXone (ROCEPHIN) 2 g in sodium chloride 0.9 % 100 mL IVPB     2 g 200 mL/hr over 30 Minutes Intravenous  Once 04/22/19 2029 04/22/19 2143         Subjective: Patient was seen and examined.  Today she was able to get out of the bed to chair with physical therapist. She has inconsistent answers, however on ophthalmic examination unable to see through the right eye. Easily irritable and distractible.  Objective: Vitals:   04/24/19 0943 04/24/19 1451 04/24/19 2035 04/25/19 0645  BP: (!) 133/58 113/71 (!) 156/124 (!) 139/120  Pulse: 84 94 91 97  Resp:  Temp:  99.5 F (37.5 C) 98.6 F (37 C) 97.6 F (36.4 C)    TempSrc:  Axillary Oral Oral  SpO2:  94% 92% 94%  Weight:      Height:        Intake/Output Summary (Last 24 hours) at 04/25/2019 1429 Last data filed at 04/25/2019 0900 Gross per 24 hour  Intake 360 ml  Output 800 ml  Net -440 ml   Filed Weights   04/23/19 0224  Weight: 129.5 kg    Examination:  General exam: Appears anxious.  Not in any distress.  Chronically sick looking.  On room air. Respiratory system: Clear to auscultation. Respiratory effort normal.  No added sounds. Cardiovascular system: S1 & S2 heard, irregularly irregular.  Gastrointestinal system: Abdomen is nondistended, soft and nontender.  Obese and pendulous. Central nervous system: Alert and oriented. No focal neurological deficits. Extremities: Symmetric 5 x 5 power.  Generalized weakness. Psychiatry: Judgement and insight appear normal. Mood & affect flat and anxious. Patient has marked swelling around right eye, active vesicular lesions some with  scabbing and drainage surrounding the right eye. Conjunctiva is injected. Patient is unable to see through right eye uncovering left eye.       Data Reviewed: I have personally reviewed following labs and imaging studies  CBC: Recent Labs  Lab 04/22/19 2051 04/23/19 0214 04/23/19 0658 04/24/19 0116 04/25/19 0533  WBC 6.6 6.5 6.3 6.3 7.1  NEUTROABS 4.6  --  3.7  --   --   HGB 13.8 13.1 13.2 13.2 12.8  HCT 43.6 40.9 40.8 40.1 38.6  MCV 90.5 90.9 90.3 89.1 87.7  PLT 298 294 313 293 361   Basic Metabolic Panel: Recent Labs  Lab 04/22/19 2051 04/23/19 0214 04/23/19 0658 04/24/19 0116 04/25/19 0533  NA 133*  --  134* 137 131*  K 4.3  --  3.9 4.0 3.8  CL 91*  --  91* 91* 92*  CO2 29  --  30 34* 30  GLUCOSE 207*  --  219* 90 75  BUN 16  --  14 11 12   CREATININE 1.42* 1.34* 1.30* 1.08* 1.10*  CALCIUM 9.9  --  9.6 9.8 9.0  MG  --   --   --  1.8 1.7  PHOS  --   --   --   --  3.0   GFR: Estimated Creatinine Clearance: 62.8 mL/min (A) (by C-G  formula based on SCr of 1.1 mg/dL (H)). Liver Function Tests: Recent Labs  Lab 04/22/19 2051 04/23/19 0658  AST 60* 43*  ALT 48* 40  ALKPHOS 533* 480*  BILITOT 1.7* 1.2  PROT 6.4* 5.8*  ALBUMIN 2.8* 2.5*   No results for input(s): LIPASE, AMYLASE in the last 168 hours. Recent Labs  Lab 04/22/19 2052  AMMONIA 30   Coagulation Profile: Recent Labs  Lab 04/22/19 2051  INR 1.1   Cardiac Enzymes: No results for input(s): CKTOTAL, CKMB, CKMBINDEX, TROPONINI in the last 168 hours. BNP (last 3 results) No results for input(s): PROBNP in the last 8760 hours. HbA1C: No results for input(s): HGBA1C in the last 72 hours. CBG: Recent Labs  Lab 04/24/19 1155 04/24/19 1643 04/24/19 2027 04/25/19 0751 04/25/19 0834  GLUCAP 101* 146* 151* 60* 77   Lipid Profile: No results for input(s): CHOL, HDL, LDLCALC, TRIG, CHOLHDL, LDLDIRECT in the last 72 hours. Thyroid Function Tests: Recent Labs    04/23/19 0658  TSH 0.090*   Anemia Panel: No results for input(s): VITAMINB12, FOLATE, FERRITIN, TIBC, IRON, RETICCTPCT in the last 72 hours. Sepsis Labs: Recent Labs  Lab 04/22/19 2051  LATICACIDVEN 1.3    Recent Results (from the past 240 hour(s))  Blood Culture (routine x 2)     Status: None (Preliminary result)   Collection Time: 04/22/19  8:51 PM   Specimen: BLOOD  Result Value Ref Range Status   Specimen Description BLOOD LEFT ANTECUBITAL  Final   Special Requests   Final    BOTTLES DRAWN AEROBIC AND ANAEROBIC Blood Culture adequate volume   Culture   Final    NO GROWTH 3 DAYS Performed at [facility], [address], [place], [place] [address]    Report Status PENDING  Incomplete  SARS CORONAVIRUS 2 (TAT 6-24 HRS) Nasopharyngeal Nasopharyngeal Swab     Status: Abnormal   Collection Time: 04/22/19 11:43 PM   Specimen: Nasopharyngeal Swab  Result Value Ref Range Status   SARS Coronavirus 2 POSITIVE (A) NEGATIVE Final    Comment: RESULT CALLED TO, READ BACK BY  AND VERIFIED WITH: Colleen Can RN 9:30  04/23/19 ([email or web address]) (NOTE) SARS-CoV-2 target nucleic acids are DETECTED. The SARS-CoV-2 RNA is generally detectable in upper and lower respiratory specimens during the acute phase of infection. Positive results are indicative of the presence of SARS-CoV-2 RNA. Clinical correlation with patient history and other diagnostic information is  necessary to determine patient infection status. Positive results do not rule out bacterial infection or co-infection with other viruses.  The expected result is Negative. Fact Sheet for Patients: HairSlick.no Fact Sheet for Healthcare Providers: quierodirigir.com This test is not yet approved or cleared by the [place] FDA and  has been authorized for detection and/or diagnosis of SARS-CoV-2 by FDA under an Emergency Use Authorization (EUA). This EUA will remain  in effect (meaning this test can be used) for the du ration of the COVID-19 declaration under Section 564(b)(1) of the Act, 21 U.S.C. section 360bbb-3(b)(1), unless the authorization is terminated or revoked sooner. Performed at [facility], [address], [place], [place] [address]   Urine culture     Status: Abnormal   Collection Time: 04/23/19  1:10 AM   Specimen: In/Out Cath Urine  Result Value Ref Range Status   Specimen Description IN/OUT CATH URINE  Final   Special Requests   Final    NONE Performed at [facility], [address], [place], [place] [address]    Culture 2,000 COLONIES/mL ENTEROCOCCUS FAECALIS (A)  Final   Report Status 04/25/2019 FINAL  Final   Organism ID, Bacteria ENTEROCOCCUS FAECALIS (A)  Final      Susceptibility   Enterococcus faecalis - MIC*    AMPICILLIN <=2 SENSITIVE Sensitive     NITROFURANTOIN <=16 SENSITIVE Sensitive     VANCOMYCIN 1 SENSITIVE Sensitive     * 2,000 COLONIES/mL ENTEROCOCCUS FAECALIS  Blood Culture (routine x 2)     Status: None  (Preliminary result)   Collection Time: 04/23/19  2:24 AM   Specimen: BLOOD RIGHT FOREARM  Result Value Ref Range Status   Specimen Description BLOOD RIGHT FOREARM  Final   Special Requests   Final    BOTTLES DRAWN AEROBIC AND ANAEROBIC Blood Culture adequate volume   Culture   Final    NO GROWTH 2 DAYS Performed at [facility], [address], [place], [place] [address]    Report Status PENDING  Incomplete  Virus culture     Status: None   Collection Time: 04/23/19 11:25 AM   Specimen: Eye, Right  Result Value Ref Range Status   Viral Culture Comment  Final    Comment: (NOTE) Preliminary Report: No virus isolated at 24 hours. Next report to follow after 4 days. Performed At: [facility] [address] [place], [place] [id number] Jolene Schimke MD XB:[id number]    Source of Sample RIGHT EYE  Final    Comment: Performed at [facility], [address] [place], [place] [address]         Radiology Studies: No results found.      Scheduled Meds: . apixaban  5 mg Oral BID  . DULoxetine  120 mg Oral Daily  . erythromycin   Right Eye TID  . fentaNYL  1 patch Transdermal Q72H  . furosemide  40 mg Oral Q1400  . insulin aspart  0-9 Units Subcutaneous TID WC  . insulin detemir  20 Units Subcutaneous BID  . lamoTRIgine  25 mg Oral Q1400  . levothyroxine  100 mcg Oral Q0600  . metoprolol tartrate  25 mg Oral BID  .  mometasone-formoterol  2 puff Inhalation BID  . montelukast  10 mg Oral Q1400  . polyethylene glycol  17 g Oral Q72H  . prednisoLONE acetate  1 drop Right Eye QID  . predniSONE  60 mg Oral Q breakfast  . rOPINIRole  0.5 mg Oral QHS  . senna  1 tablet Oral Q1400  . traZODone  100 mg Oral QHS   Continuous Infusions: . acyclovir 915 mg (04/25/19 1239)     LOS: 2 days    Time spent: 25 minutes    Dorcas Carrow, MD [facility] Pager [phone]

## 2019-04-25 NOTE — Evaluation (Signed)
Occupational Therapy Evaluation Patient Details Name: Ashley Caldwell MRN: [id number] DOB: [birth year] Today's Date: 04/25/2019    History of Present Illness Ashley Caldwell is a 79 y.o. female with history of diabetes mellitus type 2, hypertension, fibromyalgia hypothyroidism morbid obesity on chronic pain medications was referred to the ER the patient had gone to ophthalmologist office.  Patient also was found to be in A. fib with RVR.  Patient states over the last 3 to 4 days patient has been having swelling of the right periorbital area.  Is able to see through the but swelling has been progressive worsening with some matting of the eye.  Denies any fever chills.  Denies any significant pain around the swelling of the eyes.  Patient has generalized body ache.   Clinical Impression   PTA pt from SNF and has recently had COVID-19. She states since COVID dx she has not been out of the bed much at all, but prior to that was able to complete functional mobility with RW to bathroom. At time of eval, pt presents very labile but easily redirected. She shows cognitive deficits, decreased activity tolerance and functional mobility. Noted sore B feet with increased redness, suspect to being positioned in supine for prolonged period of time. She shares that she has broken her L humerus (unsure of when) and that it has not been repaired so this UE is limited. Pt completed bed mobility with mod A +2 and x2 sit <> stands with RW at mod A +2. Side by side transfer then completed to pivot to recliner to simulate toilet transfer with mod A +2. Recommend she return to current facility for continued therapy. Will continue to follow per POC listed below.     Follow Up Recommendations  SNF;Supervision/Assistance - 24 hour;Other (comment)(return to facility)    Equipment Recommendations  None recommended by OT    Recommendations for Other Services       Precautions / Restrictions Precautions Precautions:  Fall Precaution Comments: chronic L UE pain/weakness from humeral fx  Restrictions Weight Bearing Restrictions: No      Mobility Bed Mobility Overal bed mobility: Needs Assistance Bed Mobility: Supine to Sit     Supine to sit: Mod assist;+2 for physical assistance;HOB elevated     General bed mobility comments: use of rail; cues for sequencing/technique; assist to bring hips to EOB with bed pad and to elevate trunk into sitting  Transfers Overall transfer level: Needs assistance Equipment used: Rolling walker (2 wheeled);2 person hand held assist Transfers: Sit to/from [facility] Sit to Stand: Mod assist;+2 physical assistance Stand pivot transfers: Mod assist;+2 physical assistance       General transfer comment: pt stood 2 from EOB with use of RW and then third trial able to pivot to recliner with +2 HHA and gait belt; cues for safe hand placement; pt does well with counting to 3 and using momentum    Balance Overall balance assessment: Needs assistance   Sitting balance-Leahy Scale: Fair     Standing balance support: During functional activity;Bilateral upper extremity supported Standing balance-Leahy Scale: Poor Standing balance comment: reliant on external support                           ADL either performed or assessed with clinical judgement   ADL Overall ADL's : Needs assistance/impaired Eating/Feeding: Set up;Sitting   Grooming: Set up;Sitting   Upper Body Bathing: Moderate assistance;Sitting   Lower Body Bathing:  Total assistance;Sitting/lateral leans;Sit to/from stand   Upper Body Dressing : Moderate assistance;Sitting   Lower Body Dressing: Total assistance;Sitting/lateral leans;Sit to/from stand Lower Body Dressing Details (indicate cue type and reason): to don socks, limited by pain Toilet Transfer: Moderate assistance;+2 for physical assistance;+2 for safety/equipment;Stand-pivot Toilet Transfer Details (indicate cue  type and reason): simulated to recline; pt mod A +2 to rise and steady and pivot over to recliner Toileting- Clothing Manipulation and Hygiene: Maximal assistance;Sit to/from stand       Functional mobility during ADLs: Moderate assistance;+2 for physical assistance;+2 for safety/equipment(stand pivot only) General ADL Comments: pt limited by cognitive deficits, decreased activity tolerance, and pain     Vision   Additional Comments: R periorbital swelling from shingles. States that she cannot see well but cannot describe. Is able to accurately reach for objects     Perception     Praxis      Pertinent Vitals/Pain Pain Assessment: Faces Faces Pain Scale: Hurts even more Pain Location: lateral sides of feet and "skin" Pain Descriptors / Indicators: Grimacing;Guarding;Moaning;Crying Pain Intervention(s): Limited activity within patient's tolerance;Monitored during session;Patient requesting pain meds-RN notified;RN gave pain meds during session     Hand Dominance     Extremity/Trunk Assessment Upper Extremity Assessment Upper Extremity Assessment: Generalized weakness;LUE deficits/detail LUE Deficits / Details: reports humerus was broken (unsure of date?) and that "nothing was done to fix it"   Lower Extremity Assessment Lower Extremity Assessment: Defer to PT evaluation       Communication Communication Communication: No difficulties   Cognition Arousal/Alertness: Awake/alert Behavior During Therapy: Restless(labile) Overall Cognitive Status: No family/caregiver present to determine baseline cognitive functioning Area of Impairment: Orientation;Memory;Following commands;Problem solving                 Orientation Level: Disoriented to;Time;Situation   Memory: Decreased short-term memory Following Commands: Follows one step commands with increased time     Problem Solving: Slow processing;Decreased initiation;Difficulty sequencing;Requires verbal cues;Requires  tactile cues General Comments: pt with labile behavior, crying and moaning throughout but responds well to redirection and is pleasant when this support is offered   General Comments  HR into 120s with mobility; noted redness and bump/blister on lateral side of L foot but both feet very painful    Exercises     Shoulder Instructions      Home Living Family/patient expects to be discharged to:: Skilled nursing facility                                        Prior Functioning/Environment Level of Independence: Needs assistance  Gait / Transfers Assistance Needed: Patient has not really been out of the bed since before she got Covid which was over a month ago. Prior to that she states she could ambulate to her bathroom with a walker. ADL's / Homemaking Assistance Needed: assist for all BADL at this ime, recently has not been out of bed            OT Problem List: Decreased strength;Decreased knowledge of use of DME or AE;Decreased activity tolerance;Decreased cognition;Impaired balance (sitting and/or standing);Pain;Decreased safety awareness      OT Treatment/Interventions: Self-care/ADL training;Therapeutic exercise;Patient/family education;Balance training;Energy conservation;Therapeutic activities;DME and/or AE instruction;Cognitive remediation/compensation    OT Goals(Current goals can be found in the care plan section) Acute Rehab OT Goals Patient Stated Goal: to stop hurting so much OT Goal Formulation: With patient Time  For Goal Achievement: 05/09/19 Potential to Achieve Goals: Good  OT Frequency: Min 2X/week   Barriers to D/C:            Co-evaluation PT/OT/SLP Co-Evaluation/Treatment: Yes Reason for Co-Treatment: For patient/therapist safety;To address functional/ADL transfers PT goals addressed during session: Mobility/safety with mobility OT goals addressed during session: ADL's and self-care;Strengthening/ROM;Proper use of Adaptive equipment and  DME      AM-PAC OT "6 Clicks" Daily Activity     Outcome Measure Help from another person eating meals?: A Little Help from another person taking care of personal grooming?: A Little Help from another person toileting, which includes using toliet, bedpan, or urinal?: A Lot Help from another person bathing (including washing, rinsing, drying)?: A Lot Help from another person to put on and taking off regular upper body clothing?: A Lot Help from another person to put on and taking off regular lower body clothing?: A Lot 6 Click Score: 14   End of Session Equipment Utilized During Treatment: Gait belt;Rolling walker Nurse Communication: Mobility status  Activity Tolerance: Patient tolerated treatment well Patient left: in chair;with call bell/phone within reach;with chair alarm set  OT Visit Diagnosis: Unsteadiness on feet (R26.81);Other abnormalities of gait and mobility (R26.89);Muscle weakness (generalized) (M62.81);Other symptoms and signs involving cognitive function;Pain Pain - part of body: Leg;Knee                Time: 8891-6945 OT Time Calculation (min): 41 min Charges:  OT General Charges $OT Visit: 1 Visit OT Evaluation $OT Eval Moderate Complexity: 1 Mod  Dalphine Handing, MSOT, OTR/L Acute Rehabilitation Services [facility] Office Number: [phone]  Dalphine Handing 04/25/2019, 11:47 AM

## 2019-04-26 LAB — CBC
HCT: 36.9 % (ref 36.0–46.0)
Hemoglobin: 12.4 g/dL (ref 12.0–15.0)
MCH: 29.3 pg (ref 26.0–34.0)
MCHC: 33.6 g/dL (ref 30.0–36.0)
MCV: 87.2 fL (ref 80.0–100.0)
Platelets: 287 10*3/uL (ref 150–400)
RBC: 4.23 MIL/uL (ref 3.87–5.11)
RDW: 14.4 % (ref 11.5–15.5)
WBC: 3.7 10*3/uL — ABNORMAL LOW (ref 4.0–10.5)
nRBC: 0 % (ref 0.0–0.2)

## 2019-04-26 LAB — GLUCOSE, CAPILLARY
Glucose-Capillary: 365 mg/dL — ABNORMAL HIGH (ref 70–99)
Glucose-Capillary: 245 mg/dL — ABNORMAL HIGH (ref 70–99)
Glucose-Capillary: 241 mg/dL — ABNORMAL HIGH (ref 70–99)
Glucose-Capillary: 257 mg/dL — ABNORMAL HIGH (ref 70–99)

## 2019-04-26 MED ORDER — PREDNISONE 20 MG PO TABS
40.0000 mg | ORAL_TABLET | Freq: Every day | ORAL | Status: DC
  Administered 2019-04-27: 40 mg via ORAL
  Filled 2019-04-26: qty 2

## 2019-04-26 MED ORDER — INSULIN DETEMIR 100 UNIT/ML ~~LOC~~ SOLN
25.0000 [IU] | Freq: Every day | SUBCUTANEOUS | Status: DC
  Administered 2019-04-26 – 2019-04-27 (×2): 25 [IU] via SUBCUTANEOUS
  Filled 2019-04-26 (×2): qty 0.25

## 2019-04-26 MED ORDER — WHITE PETROLATUM EX OINT
TOPICAL_OINTMENT | CUTANEOUS | Status: AC
  Filled 2019-04-26: qty 28.35

## 2019-04-26 MED ORDER — INSULIN DETEMIR 100 UNIT/ML ~~LOC~~ SOLN
20.0000 [IU] | Freq: Every day | SUBCUTANEOUS | Status: DC
  Administered 2019-04-26: 20 [IU] via SUBCUTANEOUS
  Filled 2019-04-26 (×2): qty 0.2

## 2019-04-26 NOTE — Progress Notes (Signed)
PROGRESS NOTE    Ashley Caldwell  GLO:[id number] DOB: [birth year] DOA: 04/22/2019 PCP: Benita Stabile, MD    Brief Narrative:  From long-term nursing home.  Recent diagnosis of COVID-19 on 03/22/2019 at the nursing home.  She has history of type 2 diabetes on insulin, hypertension, fibromyalgia, hypothyroidism and morbid obesity, chronic pain syndrome on chronic opiates with pain management sent to the emergency room from ophthalmologist office where she was found with A. fib with RVR.  Patient had right periorbital swelling ongoing for about 3 to 4 days, matting of the right eye lids.  She was taken to ophthalmology office, routine test showed A. fib with RVR heart rate more than 110 and sent to ER. In the emergency room patient was hemodynamically stable.  A. fib with RVR, heart rate 110s, urinalysis normal.  CT head normal.  Creatinine 1.4.  Mild elevated LFTs.  Blood glucose was 207.  Patient was treated for periorbital cellulitis with IV antibiotics and admitted to the hospital.  04/24/2019, treated with oral acyclovir and erythromycin drops, antibiotic discontinued. 04/25/2019, worsening lesions, vision loss and unable to open right eye, started on IV acyclovir, oral prednisone and prednisone drops. 04/26/2019, dramatic improvement of vesicular lesions and eye symptoms.   Assessment & Plan:   Principal Problem:   Herpes zoster ophthalmicus of right eye Active Problems:   DM type 2 (diabetes mellitus, type 2) (HCC)   HTN (hypertension), benign   Psoriatic arthritis (HCC)   Asthma   Elevated LFTs   Atrial fibrillation (HCC)   Chronic pain syndrome   Opioid dependence (HCC)   Constipation due to opioid therapy   Iatrogenic hyperthyroidism  Herpes zoster ophthalmicus of the right eye: Initially admitted for periorbital cellulitis, however clinical picture consistent with herpes zoster ophthalmicus with involvement of eyes. Case was discussed with ophthalmology by Dr. Laural Benes, she was started  on oral acyclovir and erythromycin eyedrops. 2/6, worsening local symptoms, draining lesions and patient unable to see by her right eye.  Treated as severe symptomatic HZO with IV acyclovir, prednisone drops and oral prednisone.   Patient did good clinical recovery today with dramatic improvement of swelling and vision.   Airborne isolation until active lesions as per infectious disease protocol. Follow-up with ophthalmology next week after discharge.  New onset A. fib with RVR: Heart rate is better controlled on metoprolol.  Still remains as A. fib.  Since she is not very symptomatic, will continue current dose of metoprolol.  CHA2DS2-VASc score of 5, 2D echocardiogram was normal. HAS-BLED score of 2.  Benefit outweighs risk. Discussed with patient and daughter, started on Eliquis. Will refer to cardiology for outpatient follow-up. TSH suppressed, might have triggered A. fib.  Chronic pain syndrome: On fentanyl patch that she will continue.  Type 2 diabetes on insulin, uncontrolled with hypoglycemia: Patient had a hypoglycemic episode in the hospital that is currently improved.   Her blood sugars improving and elevated now.  Increased dose of long-acting insulin.Will watch closely and uptitrate as needed.    Hypertension: Blood pressures are stable.  Hypothyroidism: On Synthroid 125.  TSH 0.09, over suppressed.  Might have triggered A. fib.  Dose is decreased to 100 mcg.  Will need recheck in 1 month.  Recent COVID-19 infection: Outside the isolation window.  DVT prophylaxis: Eliquis Code Status: DNR Family Communication: Patient's daughter, Ms. [name] on the phone. Disposition Plan: patient is from long-term nursing home. Anticipated DC to long-term nursing home, Barriers to discharge worsening herpes zoster lesions needing  IV antiviral medications and monitoring. If continues to improve, will be able to discharge home tomorrow.   Consultants:   None  Procedures:    None  Antimicrobials:  Anti-infectives (From admission, onward)   Start     Dose/Rate Route Frequency Ordered Stop   04/25/19 1130  acyclovir (ZOVIRAX) 915 mg in dextrose 5 % 150 mL IVPB     10 mg/kg  91.5 kg (Adjusted) 168.3 mL/hr over 60 Minutes Intravenous Every 8 hours 04/25/19 1033     04/24/19 1530  vancomycin (VANCOREADY) IVPB 1500 mg/300 mL  Status:  Discontinued     1,500 mg 150 mL/hr over 120 Minutes Intravenous Every 36 hours 04/23/19 0829 04/23/19 1243   04/24/19 0000  valACYclovir (VALTREX) 1000 MG tablet     1,000 mg Oral 3 times daily 04/24/19 0827 05/01/19 2359   04/23/19 2000  cefTRIAXone (ROCEPHIN) 2 g in sodium chloride 0.9 % 100 mL IVPB  Status:  Discontinued     2 g 200 mL/hr over 30 Minutes Intravenous Every 24 hours 04/23/19 0110 04/23/19 1242   04/23/19 1600  valACYclovir (VALTREX) tablet 1,000 mg  Status:  Discontinued     1,000 mg Oral 3 times daily 04/23/19 1232 04/23/19 1257   04/23/19 1400  acyclovir (ZOVIRAX) 915 mg in dextrose 5 % 150 mL IVPB  Status:  Discontinued     10 mg/kg  91.5 kg (Adjusted) 168.3 mL/hr over 60 Minutes Intravenous Every 8 hours 04/23/19 1124 04/23/19 1230   04/23/19 1300  valACYclovir (VALTREX) tablet 1,000 mg  Status:  Discontinued     1,000 mg Oral 3 times daily 04/23/19 1257 04/25/19 1033   04/23/19 0145  vancomycin (VANCOCIN) IVPB 1000 mg/200 mL premix     1,000 mg 200 mL/hr over 60 Minutes Intravenous  Once 04/23/19 0131 04/23/19 0436   04/22/19 2030  vancomycin (VANCOCIN) IVPB 1000 mg/200 mL premix     1,000 mg 200 mL/hr over 60 Minutes Intravenous  Once 04/22/19 2029 04/22/19 2303   04/22/19 2030  cefTRIAXone (ROCEPHIN) 2 g in sodium chloride 0.9 % 100 mL IVPB     2 g 200 mL/hr over 30 Minutes Intravenous  Once 04/22/19 2029 04/22/19 2143         Subjective: Patient seen and examined.  She was dramatically improved, less pain, able to open her eyes and was able to eat her food.  She herself feels better after so  many days.  Blood sugars elevated. Objective: Vitals:   04/25/19 0645 04/25/19 1616 04/25/19 2240 04/26/19 0640  BP: (!) 139/120 115/73 (!) 99/59 110/60  Pulse: 97 92 83 (!) 58  Resp: Temp: 97.6 F (36.4 C) 97.9 F (36.6 C) 98.6 F (37 C) 99.5 F (37.5 C)  TempSrc: Oral Axillary Oral Oral  SpO2: 94% 94% 91% 100%  Weight:      Height:        Intake/Output Summary (Last 24 hours) at 04/26/2019 1122 Last data filed at 04/25/2019 1330 Gross per 24 hour  Intake 300 ml  Output --  Net 300 ml   Filed Weights   04/23/19 0224  Weight: 129.5 kg    Examination:  General exam: Appears comfortable.  On room air.  Chronically sick looking, however not in any distress. Respiratory system: Clear to auscultation. Respiratory effort normal.  No added sounds. Cardiovascular system: S1 & S2 heard, irregularly irregular.  Gastrointestinal system: Abdomen is nondistended, soft and nontender.  Obese and pendulous. Central nervous  system: Alert and oriented. No focal neurological deficits. Extremities: Symmetric 5 x 5 power.  Generalized weakness. Psychiatry: Judgement and insight appear normal. Mood & affect normal. Vesicular lesions and swelling around the right eye much improved, lesions are drier and able to see through right eye. Comparison pictures as below.  04/25/19   04/26/19    Data Reviewed: I have personally reviewed following labs and imaging studies  CBC: Recent Labs  Lab 04/22/19 2051 04/22/19 2051 04/23/19 0214 04/23/19 0658 04/24/19 0116 04/25/19 0533 04/26/19 0300  WBC 6.6   < > 6.5 6.3 6.3 7.1 3.7*  NEUTROABS 4.6  --   --  3.7  --   --   --   HGB 13.8   < > 13.1 13.2 13.2 12.8 12.4  HCT 43.6   < > 40.9 40.8 40.1 38.6 36.9  MCV 90.5   < > 90.9 90.3 89.1 87.7 87.2  PLT 298   < > 294 313 293 274 287   < > = values in this interval not displayed.   Basic Metabolic Panel: Recent Labs  Lab 04/22/19 2051 04/23/19 0214 04/23/19 0658 04/24/19 0116  04/25/19 0533  NA 133*  --  134* 137 131*  K 4.3  --  3.9 4.0 3.8  CL 91*  --  91* 91* 92*  CO2 29  --  30 34* 30  GLUCOSE 207*  --  219* 90 75  BUN 16  --  14 11 12   CREATININE 1.42* 1.34* 1.30* 1.08* 1.10*  CALCIUM 9.9  --  9.6 9.8 9.0  MG  --   --   --  1.8 1.7  PHOS  --   --   --   --  3.0   GFR: Estimated Creatinine Clearance: 62.8 mL/min (A) (by C-G formula based on SCr of 1.1 mg/dL (H)). Liver Function Tests: Recent Labs  Lab 04/22/19 2051 04/23/19 0658  AST 60* 43*  ALT 48* 40  ALKPHOS 533* 480*  BILITOT 1.7* 1.2  PROT 6.4* 5.8*  ALBUMIN 2.8* 2.5*   No results for input(s): LIPASE, AMYLASE in the last 168 hours. Recent Labs  Lab 04/22/19 2052  AMMONIA 30   Coagulation Profile: Recent Labs  Lab 04/22/19 2051  INR 1.1   Cardiac Enzymes: No results for input(s): CKTOTAL, CKMB, CKMBINDEX, TROPONINI in the last 168 hours. BNP (last 3 results) No results for input(s): PROBNP in the last 8760 hours. HbA1C: No results for input(s): HGBA1C in the last 72 hours. CBG: Recent Labs  Lab 04/25/19 0751 04/25/19 0834 04/25/19 1150 04/25/19 1632 04/25/19 2232  GLUCAP 60* 77 128* 363* 376*   Lipid Profile: No results for input(s): CHOL, HDL, LDLCALC, TRIG, CHOLHDL, LDLDIRECT in the last 72 hours. Thyroid Function Tests: No results for input(s): TSH, T4TOTAL, FREET4, T3FREE, THYROIDAB in the last 72 hours. Anemia Panel: No results for input(s): VITAMINB12, FOLATE, FERRITIN, TIBC, IRON, RETICCTPCT in the last 72 hours. Sepsis Labs: Recent Labs  Lab 04/22/19 2051  LATICACIDVEN 1.3    Recent Results (from the past 240 hour(s))  Blood Culture (routine x 2)     Status: None (Preliminary result)   Collection Time: 04/22/19  8:51 PM   Specimen: BLOOD  Result Value Ref Range Status   Specimen Description BLOOD LEFT ANTECUBITAL  Final   Special Requests   Final    BOTTLES DRAWN AEROBIC AND ANAEROBIC Blood Culture adequate volume   Culture   Final    NO GROWTH  4 DAYS Performed  at [facility], [place] [address], [place], [place] [address]    Report Status PENDING  Incomplete  SARS CORONAVIRUS 2 (TAT 6-24 HRS) Nasopharyngeal Nasopharyngeal Swab     Status: Abnormal   Collection Time: 04/22/19 11:43 PM   Specimen: Nasopharyngeal Swab  Result Value Ref Range Status   SARS Coronavirus 2 POSITIVE (A) NEGATIVE Final    Comment: RESULT CALLED TO, READ BACK BY AND VERIFIED WITH: Colleen Can RN 9:30 04/23/19 ([email or web address]) (NOTE) SARS-CoV-2 target nucleic acids are DETECTED. The SARS-CoV-2 RNA is generally detectable in upper and lower respiratory specimens during the acute phase of infection. Positive results are indicative of the presence of SARS-CoV-2 RNA. Clinical correlation with patient history and other diagnostic information is  necessary to determine patient infection status. Positive results do not rule out bacterial infection or co-infection with other viruses.  The expected result is Negative. Fact Sheet for Patients: SugarRoll.be Fact Sheet for Healthcare Providers: https://www.woods-mathews.com/ This test is not yet approved or cleared by the [place] FDA and  has been authorized for detection and/or diagnosis of SARS-CoV-2 by FDA under an Emergency Use Authorization (EUA). This EUA will remain  in effect (meaning this test can be used) for the du ration of the COVID-19 declaration under Section 564(b)(1) of the Act, 21 U.S.C. section 360bbb-3(b)(1), unless the authorization is terminated or revoked sooner. Performed at [facility], [place] [address], [place], [place] [address]   Urine culture     Status: Abnormal   Collection Time: 04/23/19  1:10 AM   Specimen: In/Out Cath Urine  Result Value Ref Range Status   Specimen Description IN/OUT CATH URINE  Final   Special Requests   Final    NONE Performed at [facility], [place] [address], [place], [place] [address]    Culture 2,000  COLONIES/mL ENTEROCOCCUS FAECALIS (A)  Final   Report Status 04/25/2019 FINAL  Final   Organism ID, Bacteria ENTEROCOCCUS FAECALIS (A)  Final      Susceptibility   Enterococcus faecalis - MIC*    AMPICILLIN <=2 SENSITIVE Sensitive     NITROFURANTOIN <=16 SENSITIVE Sensitive     VANCOMYCIN 1 SENSITIVE Sensitive     * 2,000 COLONIES/mL ENTEROCOCCUS FAECALIS  Blood Culture (routine x 2)     Status: None (Preliminary result)   Collection Time: 04/23/19  2:24 AM   Specimen: BLOOD RIGHT FOREARM  Result Value Ref Range Status   Specimen Description BLOOD RIGHT FOREARM  Final   Special Requests   Final    BOTTLES DRAWN AEROBIC AND ANAEROBIC Blood Culture adequate volume   Culture   Final    NO GROWTH 3 DAYS Performed at [facility], [place] [address], [place], [place] [address]    Report Status PENDING  Incomplete  Virus culture     Status: None   Collection Time: 04/23/19 11:25 AM   Specimen: Eye, Right  Result Value Ref Range Status   Viral Culture Comment  Final    Comment: (NOTE) Preliminary Report: No virus isolated at 24 hours. Next report to follow after 4 days. Performed At: [facility] [place], [place] [id number] Rush Farmer MD NT:[id number]    Source of Sample RIGHT EYE  Final    Comment: Performed at [facility], [place] [address] [place], [place] [address]         Radiology Studies: No results found.      Scheduled Meds: . apixaban  5 mg Oral BID  . DULoxetine  120 mg Oral Daily  . erythromycin   Right Eye TID  . fentaNYL  1 patch Transdermal Q72H  . furosemide  40 mg Oral Q1400  . insulin aspart  0-9 Units Subcutaneous TID WC  . insulin detemir  20 Units Subcutaneous QHS  . insulin detemir  25 Units Subcutaneous Daily  . lamoTRIgine  25 mg Oral Q1400  . levothyroxine  100 mcg Oral Q0600  . metoprolol tartrate  25 mg Oral BID  . mometasone-formoterol  2 puff Inhalation BID  . montelukast  10 mg Oral Q1400  .  polyethylene glycol  17 g Oral Q72H  . prednisoLONE acetate  1 drop Right Eye QID  . [START ON 04/27/2019] predniSONE  40 mg Oral Q breakfast  . rOPINIRole  0.5 mg Oral QHS  . senna  1 tablet Oral Q1400  . traZODone  100 mg Oral QHS   Continuous Infusions: . acyclovir 915 mg (04/26/19 0635)     LOS: 3 days    Time spent: 25 minutes    Dorcas Carrow, MD [facility] Pager [phone]

## 2019-04-27 LAB — GLUCOSE, CAPILLARY
Glucose-Capillary: 183 mg/dL — ABNORMAL HIGH (ref 70–99)
Glucose-Capillary: 123 mg/dL — ABNORMAL HIGH (ref 70–99)

## 2019-04-27 LAB — CBC
HCT: 38.9 % (ref 36.0–46.0)
Hemoglobin: 13 g/dL (ref 12.0–15.0)
MCH: 29.1 pg (ref 26.0–34.0)
MCHC: 33.4 g/dL (ref 30.0–36.0)
MCV: 87 fL (ref 80.0–100.0)
Platelets: 318 10*3/uL (ref 150–400)
RBC: 4.47 MIL/uL (ref 3.87–5.11)
RDW: 14.6 % (ref 11.5–15.5)
WBC: 7.5 10*3/uL (ref 4.0–10.5)
nRBC: 0 % (ref 0.0–0.2)

## 2019-04-27 MED ORDER — ACYCLOVIR 800 MG PO TABS: 800.0000 mg | ORAL_TABLET | Freq: Three times a day (TID) | ORAL | 0 refills | Status: DC

## 2019-04-27 MED ORDER — VALACYCLOVIR HCL 1 G PO TABS: 1000.0000 mg | ORAL_TABLET | Freq: Three times a day (TID) | ORAL | 0 refills | Status: AC

## 2019-04-27 MED ORDER — PREDNISOLONE ACETATE 1 % OP SUSP: 1.0000 [drp] | Freq: Four times a day (QID) | OPHTHALMIC | 0 refills | Status: AC

## 2019-04-27 MED ORDER — ACYCLOVIR 400 MG PO TABS: 800.0000 mg | ORAL_TABLET | Freq: Three times a day (TID) | ORAL | Status: DC

## 2019-04-27 MED ORDER — PREDNISONE 10 MG PO TABS: 40.0000 mg | ORAL_TABLET | Freq: Every day | ORAL | 0 refills | Status: DC

## 2019-04-27 MED ORDER — METOPROLOL TARTRATE 25 MG PO TABS: 25.0000 mg | ORAL_TABLET | Freq: Two times a day (BID) | ORAL | 0 refills | Status: DC

## 2019-04-27 MED ORDER — VALACYCLOVIR HCL 500 MG PO TABS
1000.0000 mg | ORAL_TABLET | Freq: Three times a day (TID) | ORAL | Status: DC
  Administered 2019-04-27: 1000 mg via ORAL
  Filled 2019-04-27 (×3): qty 2

## 2019-04-27 MED ORDER — APIXABAN 5 MG PO TABS: 5.0000 mg | ORAL_TABLET | Freq: Two times a day (BID) | ORAL | Status: AC

## 2019-04-27 NOTE — Social Work (Addendum)
1:46pm- Spoke with pt daughter [name], she is aware discharge today.  12:47pm- Clinical Social Worker facilitated patient discharge including contacting patient family (have called x2 [11:47am and 12:36pm], left message and text message for pt daughter [name] at [phone]) and facility to confirm patient discharge plans.  Clinical information faxed to facility. CSW arranged ambulance transport via [facility] to [facility].  RN to call [phone]  with report prior to discharge.  Clinical Social Worker will sign off for now as social work intervention is no longer needed. Please consult [place] again if new need arises.  Octavio Graves, MSW, LCSW Clinical Social Worker

## 2019-04-27 NOTE — Progress Notes (Signed)
Gave report to [employer] at [place].  DC summary has been updated to not include the nebulizer treatments.  Pt to transport to [facility] via [facility] at 1430.

## 2019-04-27 NOTE — Progress Notes (Signed)
Pt to [facility] via [facility].

## 2019-04-27 NOTE — Care Management Important Message (Signed)
Important Message  Patient Details  Name: Ashley Caldwell MRN: [id number] Date of Birth: June 08, 1940   Medicare Important Message Given:  Yes     Mardene Sayer 04/27/2019, 12:07 PM   DUE TO CONTACT PRECAUTION NO IM GIVEN

## 2019-04-27 NOTE — TOC Transition Note (Signed)
Transition of Care [facility]) - CM/SW Discharge Note   Patient Details  Name: Ashley Caldwell MRN: [id number] Date of Birth: 1940/12/20  [facility] CM/SW Contact:  Doy Hutching, LCSWA Phone Number: 04/27/2019, 11:19 AM   Clinical Narrative:    Per MD pt stable for discharge back to [facility] where she is a LTC pt. Admissions liaison [name] aware, pt can return under full precautions and SNF has a private room for pt to return to today. CSW called and left message for pt daughter [name] on her voicemail.    Final next level of care: Skilled Nursing Facility Barriers to Discharge: Barriers Resolved   Patient Goals and CMS Choice Patient states their goals for this hospitalization and ongoing recovery are:: return to [facility].gov Compare Post Acute Care list provided to:: Patient Represenative (must comment)(return to SNF LTC) Choice offered to / list presented to : Adult Children  Discharge Placement Existing PASRR number confirmed : 04/23/19          Patient chooses bed at: [facility] Patient to be transferred to facility by: [facility] Name of family member notified: pt daughter [name] Patient and family notified of of transfer: 04/27/19  Discharge Plan and Services In-house Referral: Clinical Social Work Discharge Planning Services: CM Consult Post Acute Care Choice: Resumption of Svcs/PTA Provider, Skilled Nursing Facility            Readmission Risk Interventions Readmission Risk Prevention Plan 04/23/2019  Transportation Screening Complete  PCP or Specialist Appt within 3-5 Days Not Complete  Not Complete comments SNF resident  [facility] or Home Care Consult Not Complete  HRI or Home Care Consult comments SNF resident  Social Work Consult for Recovery Care Planning/Counseling Complete  Palliative Care Screening Not Applicable  Medication Review (RN Care Manager) Referral to Pharmacy  Some recent data might be hidden

## 2019-04-27 NOTE — Progress Notes (Signed)
Phlebotomist is unable to get morning labs. She will have day shift to return around 0500.

## 2019-04-27 NOTE — Discharge Summary (Signed)
Physician Discharge Summary  Ashley Caldwell XHB:[id number] DOB: 03/24/1940 DOA: 04/22/2019  PCP: Celene Squibb, MD  Admit date: 04/22/2019 Discharge date: 04/27/2019  Admitted From: Long-term nursing home Disposition: Long-term nursing home  Recommendations for Outpatient Follow-up:  1. Follow-up with ophthalmology within 1 week 2. Follow-up with cardiology, referral sent  Discharge Condition: Stable CODE STATUS: DNR Diet recommendation: Low-carb diet  Discharge summary: Patient has history of type 2 diabetes on insulin, hypertension, fibromyalgia, hypothyroidism and morbid obesity, chronic pain syndrome on chronic opiates with pain management, recent COVID-19 infection from long-term nursing home sent to the emergency room from ophthalmologist office where she was found with A. fib with RVR.  Patient had right periorbital swelling ongoing for about 3 to 4 days, matting of the right eye lids.  She was taken to ophthalmology office, routine test showed A. fib with RVR heart rate more than 110 and sent to ER.  In the emergency room patient was hemodynamically stable.  A. fib with RVR, heart rate 110s, urinalysis normal.  CT head normal. Creatinine 1.4.  Mild elevated LFTs.  Blood glucose was 207.  Patient was treated for periorbital cellulitis with IV antibiotics and admitted to the hospital.  04/24/2019, treated with oral acyclovir and erythromycin drops, antibiotic discontinued. 04/25/2019, worsening lesions, suspected vision loss and unable to open right eye, started on IV acyclovir, oral prednisone and prednisone drops. 04/26/2019, dramatic improvement of vesicular lesions and eye symptoms. 04/27/2019, continue improvement of vesicular lesions, drying up, eye exam with some conjunctival injection mostly cleared.  Patient is normal.  1. 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 she was started on oral acyclovir and erythromycin eyedrops. 2/6, worsening local symptoms, draining lesions and patient unable to see by her right eye.  Treated as severe symptomatic HZO with IV acyclovir, prednisone drops and oral prednisone.   Patient did good clinical recovery with dramatic improvement of swelling and vision.   Airborne isolation until active lesions as per infectious disease protocol. Will need outpatient follow-up with ophthalmology. Due to severe disease and open lesions, airborne isolation is advised as per institution protocol until resolution of active lesions. Once her all forehead lesions are dry and flaky, she can come off the isolation. Due to very good response to steroids and acyclovir, will finish 7 more days of high-dose acyclovir for total 10 days. Oral prednisone, slow taper for 1 week. Continue prednisone eyedrops and erythromycin eyedrops to prevent secondary infection.  New onset A. fib with RVR: Heart rate is better controlled on metoprolol. Since she is not very symptomatic, will continue current dose of metoprolol.  CHA2DS2-VASc score of 5, 2D echocardiogram was normal. HAS-BLED score of 2.  Benefit outweighs risk. Discussed with patient and daughter, started on Eliquis. Will refer to cardiology for outpatient follow-up.  Referral sent. TSH suppressed, might have triggered A. fib.  Synthroid dose adjusted.  Chronic pain syndrome: On fentanyl patch and oxycodone that she will continue.  Type 2 diabetes on insulin, uncontrolled with hypoglycemia: Patient had a hypoglycemic episode in the hospital that is currently improved.   Her blood sugars improving and elevated now.  Resume long-acting insulin.  Continue prandial insulin and sliding scale insulin until she is on the steroids.   Hypertension: Blood pressures are stable.  Hypothyroidism: On Synthroid 125.  TSH 0.09, over suppressed.  Might have triggered A. fib.  Dose is  decreased to  100 mcg.  Will need recheck in 1 month.  Recent COVID-19 infection: Outside the isolation window.  Patient has done adequate clinical improvement.  She can be discharged back to long-term nursing home today.  Discharge Diagnoses:  Principal Problem:   Herpes zoster ophthalmicus of right eye Active Problems:   DM type 2 (diabetes mellitus, type 2) (HCC)   HTN (hypertension), benign   Psoriatic arthritis (HCC)   Asthma   Elevated LFTs   Atrial fibrillation (HCC)   Chronic pain syndrome   Opioid dependence (HCC)   Constipation due to opioid therapy   Iatrogenic hyperthyroidism    Discharge Instructions  Discharge Instructions    Ambulatory referral to Cardiology   Complete by: As directed    New onset Afib   Call MD for:  redness, tenderness, or signs of infection (pain, swelling, redness, odor or green/yellow discharge around incision site)   Complete by: As directed    Call MD for:  severe uncontrolled pain   Complete by: As directed    Diet Carb Modified   Complete by: As directed    Discharge instructions   Complete by: As directed    Will suggest airborn precautions until lesions are all dry and crusty. If appropriately improving and clearing up continue precautions for 1 more week. Please reschedule appointment with ophthalmology within one week.   Increase activity slowly   Complete by: As directed      Allergies as of 04/27/2019      Reactions   Lyrica [pregabalin]    Amoxicillin-pot Clavulanate Nausea And Vomiting   Latex Itching, Rash      Medication List    STOP taking these medications   buprenorphine 10 MCG/HR Ptwk patch Commonly known as: Butrans   cephALEXin 500 MG capsule Commonly known as: KEFLEX   cyclobenzaprine 5 MG tablet Commonly known as: FLEXERIL   insulin glargine 100 UNIT/ML injection Commonly known as: LANTUS   insulin lispro 100 UNIT/ML injection Commonly known as: HUMALOG   methotrexate 2.5 MG tablet Commonly  known as: RHEUMATREX     TAKE these medications   acetaminophen 325 MG tablet Commonly known as: TYLENOL Take 650 mg by mouth every 6 (six) hours as needed for mild pain or fever.   acyclovir 800 MG tablet Commonly known as: ZOVIRAX Take 1 tablet (800 mg total) by mouth 3 (three) times daily for 7 days.   albuterol 108 (90 Base) MCG/ACT inhaler Commonly known as: VENTOLIN HFA Inhale 2 puffs into the lungs every 4 (four) hours as needed for wheezing or shortness of breath.   apixaban 5 MG Tabs tablet Commonly known as: ELIQUIS Take 1 tablet (5 mg total) by mouth 2 (two) times daily.   Aspercreme Lidocaine 4 % Ptch Generic drug: Lidocaine Apply 1 patch topically daily.   benzonatate 100 MG capsule Commonly known as: TESSALON Take 100 mg by mouth 4 (four) times daily as needed for cough.   cetirizine 10 MG tablet Commonly known as: ZYRTEC Take 10 mg by mouth daily at 2 PM.   chlorhexidine 0.12 % solution Commonly known as: PERIDEX Use as directed 15 mLs in the mouth or throat 2 (two) times daily.   Cranberry 450 MG Tabs Take 450 mg by mouth daily.   DULoxetine 60 MG capsule Commonly known as: CYMBALTA Take 1 capsule (60 mg total) by mouth 2 (two) times daily. What changed:   how much to take  when to take this   erythromycin ophthalmic ointment Place into  the right eye 3 (three) times daily for 7 days.   fentaNYL 50 MCG/HR Commonly known as: [place] 1 patch onto the skin every 3 (three) days. What changed: Another medication with the same name was removed. Continue taking this medication, and follow the directions you see here.   Fluticasone-Salmeterol 500-50 MCG/DOSE Aepb Commonly known as: ADVAIR Inhale 1 puff into the lungs 2 (two) times daily.   furosemide 40 MG tablet Commonly known as: LASIX Take 40 mg by mouth daily at 2 PM. What changed: Another medication with the same name was removed. Continue taking this medication, and follow the  directions you see here.   Glucagon Emergency 1 MG Kit Inject 1 mg as directed every 6 (six) hours as needed (for blood suger <60).   guaiFENesin 100 MG/5ML liquid Commonly known as: ROBITUSSIN Take 200 mg by mouth every 4 (four) hours as needed for cough.   insulin aspart 100 UNIT/ML injection Commonly known as: NovoLOG Before each meal 3 times a day, 140-199 - 2 units, 200-250 - 4 units, 251-299 - 6 units,  300-349 - 8 units,  350 or above 10 units. Dispense syringes and needles as needed, Ok to switch to PEN if approved. Substitute to any brand approved. DX DM2, Code E11.65 What changed: Another medication with the same name was removed. Continue taking this medication, and follow the directions you see here.   insulin detemir 100 UNIT/ML injection Commonly known as: LEVEMIR Inject 20-25 Units into the skin See admin instructions. Use 25 units every morning and use 20 units at bedtime   ipratropium 0.02 % nebulizer solution Commonly known as: ATROVENT Take 0.5 mg by nebulization every 4 (four) hours as needed for wheezing or shortness of breath.   lamoTRIgine 25 MG tablet Commonly known as: LAMICTAL Take 25 mg by mouth daily at 2 PM.   levothyroxine 100 MCG tablet Commonly known as: SYNTHROID Take 1 tablet (100 mcg total) by mouth daily. What changed:   medication strength  how much to take   lisinopril 5 MG tablet Commonly known as: ZESTRIL Take 5 mg by mouth daily at 2 PM.   metFORMIN 1000 MG tablet Commonly known as: GLUCOPHAGE Take 1,000 mg by mouth daily at 2 PM.   metoprolol tartrate 25 MG tablet Commonly known as: LOPRESSOR Take 1 tablet (25 mg total) by mouth 2 (two) times daily.   Moderna COVID-19 Vaccine 100 MCG/0.5ML Susp Generic drug: COVID-19 mRNA vaccine (Moderna) Inject into the muscle once.   montelukast 10 MG tablet Commonly known as: SINGULAIR Take 10 mg by mouth daily at 2 PM.   multivitamin with minerals Tabs tablet Take 1 tablet by mouth  daily at 2 PM.   ondansetron 4 MG tablet Commonly known as: ZOFRAN Take 4 mg by mouth every 6 (six) hours as needed for nausea or vomiting.   Oxycodone HCl 10 MG Tabs Take 1 tablet (10 mg total) by mouth every 4 (four) hours as needed for up to 5 days (for breakthrough pain). What changed: Another medication with the same name was removed. Continue taking this medication, and follow the directions you see here.   polyethylene glycol 17 g packet Commonly known as: MIRALAX / GLYCOLAX Take 17 g by mouth every 3 (three) days.   prednisoLONE acetate 1 % ophthalmic suspension Commonly known as: PRED FORTE Place 1 drop into the right eye 4 (four) times daily for 7 days.   predniSONE 10 MG tablet Commonly known as: DELTASONE Take 4  tablets (40 mg total) by mouth daily with breakfast. 4 tabs daily for 2 days, 3 tabs daily for 2 days, 2 tabs daily for 2 days, 1 tab daily for 2  days Start taking on: April 28, 2019   rOPINIRole 0.5 MG tablet Commonly known as: REQUIP Take 0.5 mg by mouth at bedtime.   senna 8.6 MG Tabs tablet Commonly known as: SENOKOT Take 1 tablet by mouth daily at 2 PM.   sodium chloride 0.65 % Soln nasal spray Commonly known as: OCEAN Place 1 spray into both nostrils 3 (three) times daily as needed for congestion.   Systane Balance 0.6 % Soln Generic drug: Propylene Glycol Place 2 drops into the right eye daily.   traZODone 100 MG tablet Commonly known as: DESYREL Take 100 mg by mouth at bedtime.   Vitamin D 50 MCG (2000 UT) tablet Take 2,000 Units by mouth daily at 2 PM.       Contact information for follow-up providers    Jola Schmidt, MD. Go on 04/24/2019.   Specialty: Ophthalmology Why: at 1:15 pm to have eye examined Contact information: [address] [place] [address] [phone]            Contact information for after-discharge care    Destination    [facility] .   Service: Skilled Nursing Contact  information: [address] [place] [phone]                 Allergies  Allergen Reactions  . Lyrica [Pregabalin]   . Amoxicillin-Pot Clavulanate Nausea And Vomiting  . Latex Itching and Rash     Procedures/Studies: CT Head Wo Contrast  Result Date: 04/22/2019 CLINICAL DATA:  Mental status change EXAM: CT HEAD WITHOUT CONTRAST TECHNIQUE: Contiguous axial images were obtained from the base of the skull through the vertex without intravenous contrast. COMPARISON:  None. FINDINGS: Brain: There is no acute intracranial hemorrhage, mass effect, or edema. Gray-white differentiation is preserved. Patchy hypoattenuation in the supratentorial white matter is nonspecific may reflect mild chronic microvascular ischemic changes. Prominence of the ventricles and sulci reflects minor generalized parenchymal volume loss. There is no extra-axial fluid collection. Vascular: There is intracranial atherosclerotic calcification at the skull base. Skull: Unremarkable. Sinuses/Orbits: Right periorbital soft tissue swelling. Right frontal sinus partial opacification. Other: Mastoid air cells are clear. IMPRESSION: No acute intracranial hemorrhage, mass effect, or evidence of acute infarction. Chronic microvascular ischemic changes. Right periorbital soft tissue swelling. Electronically Signed   By: Macy Mis M.D.   On: 04/22/2019 21:53   US Abdomen Complete  Result Date: 04/23/2019 CLINICAL DATA:  Elevated LFTs EXAM: ABDOMEN ULTRASOUND COMPLETE COMPARISON:  None. FINDINGS: Gallbladder: No gallstones or wall thickening visualized. No sonographic Murphy sign noted by sonographer. Common bile duct: Diameter: Could not be visualized prior to the termination of the procedure at the request of the patient. Liver: Diffusely increased in echogenicity likely related to fatty infiltration. Portal vein is not visualized IVC: No abnormality visualized. Pancreas: Not visualized Spleen:  Size and appearance within normal limits. Right Kidney: Length: 9.9 cm. Echogenicity within normal limits. No mass or hydronephrosis visualized. Left Kidney: Length: Not visualized Abdominal aorta: No aneurysm visualized. Other findings: None. IMPRESSION: Early termination of the procedure at the patient's request. Above-noted nonvisualized structures are related to the termination of the procedure. Fatty infiltration of the liver No other focal abnormality is noted. Electronically Signed   By: Inez Catalina M.D.   On: 04/23/2019  00:04   DG Chest Port 1 View  Result Date: 04/22/2019 CLINICAL DATA:  79 year old female with weakness. EXAM: PORTABLE CHEST 1 VIEW COMPARISON:  Chest radiograph dated 04/13/2016. FINDINGS: There is cardiomegaly with vascular congestion. No focal consolidation, pleural effusion, pneumothorax. There is diffuse interstitial coarsening, chronic. No acute osseous pathology. IMPRESSION: 1. Cardiomegaly with mild vascular congestion. No focal consolidation. 2. Chronic interstitial coarsening and bronchitic changes. Electronically Signed   By: Anner Crete M.D.   On: 04/22/2019 21:25   ECHOCARDIOGRAM COMPLETE  Result Date: 04/23/2019   ECHOCARDIOGRAM REPORT   Patient Name:   Ashley Caldwell Date of Exam: 04/23/2019 Medical Rec #:  [id number]        Height:       69.0 in Accession #:    [id number]       Weight:       285.5 lb Date of Birth:  [birth year]        BSA:          2.40 m Patient Age:    24 years         BP:           110/48 mmHg Patient Gender: F                HR:           105 bpm. Exam Location:  Inpatient Procedure: 2D Echo, Cardiac Doppler and Color Doppler Indications:    I48.91* Unspeicified atrial fibrillation  History:        Patient has no prior history of Echocardiogram examinations.                 Abnormal ECG, Arrythmias:Atrial Fibrillation; Risk                 Factors:Hypertension and Diabetes. Covid 19 positive. Opioid                 use. Pneumonia.  Sonographer:     Roseanna Rainbow RDCS Referring Phys: [place]  Sonographer Comments: Patient is morbidly obese and Technically difficult study due to poor echo windows. Image acquisition challenging due to patient body habitus and Image acquisition challenging due to uncooperative patient. Exam ended because patient  was screaming and grabbed probe. Patient complained of severe head pain. RN was notified. Patient could not tolerate study. IMPRESSIONS  1. Left ventricular ejection fraction, by visual estimation, is 60 to 65%. The left ventricle has normal function. There is no left ventricular hypertrophy.  2. Left ventricular diastolic parameters are indeterminate.  3. The left ventricle has no regional wall motion abnormalities.  4. Global right ventricle has normal systolic function.The right ventricular size is normal. No increase in right ventricular wall thickness.  5. Left atrial size was moderately dilated.  6. Right atrial size was normal.  7. Moderate mitral annular calcification.  8. The mitral valve is degenerative. No evidence of mitral valve regurgitation. No evidence of mitral stenosis.  9. The tricuspid valve is normal in structure. 10. The tricuspid valve is normal in structure. Tricuspid valve regurgitation is not demonstrated. 11. The aortic valve is tricuspid. Aortic valve regurgitation is not visualized. Mild aortic valve sclerosis without stenosis. 12. The pulmonic valve was normal in structure. Pulmonic valve regurgitation is not visualized. 13. The inferior vena cava is normal in size with greater than 50% respiratory variability, suggesting right atrial pressure of 3 mmHg. 14. Limited echo due to patient termination pain and patient clothed limited apical images  and no subcostal imaging. 15. The interatrial septum was not assessed. FINDINGS  Left Ventricle: Left ventricular ejection fraction, by visual estimation, is 60 to 65%. The left ventricle has normal function. The left ventricle has no  regional wall motion abnormalities. There is no left ventricular hypertrophy. Left ventricular diastolic parameters are indeterminate. Normal left atrial pressure. Right Ventricle: The right ventricular size is normal. No increase in right ventricular wall thickness. Global RV systolic function is has normal systolic function. Left Atrium: Left atrial size was moderately dilated. Right Atrium: Right atrial size was normal in size Pericardium: There is no evidence of pericardial effusion. Mitral Valve: The mitral valve is degenerative in appearance. There is mild thickening of the mitral valve leaflet(s). There is mild calcification of the mitral valve leaflet(s). Moderate mitral annular calcification. No evidence of mitral valve regurgitation. No evidence of mitral valve stenosis by observation. Tricuspid Valve: The tricuspid valve is normal in structure. Tricuspid valve regurgitation is not demonstrated. Aortic Valve: The aortic valve is tricuspid. Aortic valve regurgitation is not visualized. Mild aortic valve sclerosis is present, with no evidence of aortic valve stenosis. Pulmonic Valve: The pulmonic valve was normal in structure. Pulmonic valve regurgitation is not visualized. Pulmonic regurgitation is not visualized. Aorta: The aortic root, ascending aorta and aortic arch are all structurally normal, with no evidence of dilitation or obstruction. Venous: The inferior vena cava is normal in size with greater than 50% respiratory variability, suggesting right atrial pressure of 3 mmHg. IAS/Shunts: The interatrial septum was not assessed. There is no evidence of a patent foramen ovale. No ventricular septal defect is seen or detected. There is no evidence of an atrial septal defect. Additional Comments: Limited echo due to patient termination pain and patient clothed limited apical images and no subcostal imaging.  LEFT VENTRICLE PLAX 2D LVIDd:         5.00 cm LVIDs:         4.30 cm LV PW:         1.20 cm LV IVS:         0.90 cm LVOT diam:     1.90 cm LV SV:         35 ml LV SV Index:   13.67 LVOT Area:     2.84 cm  LEFT ATRIUM           Index LA diam:      4.40 cm 1.83 cm/m LA Vol (A4C): 56.6 ml 23.55 ml/m   AORTA Ao Root diam: 3.30 cm Ao Asc diam:  3.50 cm  SHUNTS Systemic Diam: 1.90 cm  Jenkins Rouge MD Electronically signed by Jenkins Rouge MD Signature Date/Time: 04/23/2019/11:27:11 AM    Final       Subjective: Patient was seen and examined.  No overnight events.  She is very happy to be able to look through the right eye.  Denies any pain in the eyes.  She does have her pain in her joints and legs that has been always there. Remains afebrile. Happy to be able to return to nursing home.   Discharge Exam: Vitals:   04/26/19 2134 04/27/19 0621  BP: (!) 104/42 124/65  Pulse: 80 68  Resp: 18 18  Temp: 98 F (36.7 C) 99.7 F (37.6 C)  SpO2: 97% 97%   Vitals:   04/26/19 1324 04/26/19 2053 04/26/19 2134 04/27/19 0621  BP: 106/71  (!) 104/42 124/65  Pulse: 74 70 80 68  Resp: 18 18 18 18   Temp: 98.1 F (  36.7 C)  98 F (36.7 C) 99.7 F (37.6 C)  TempSrc: Oral  Oral Oral  SpO2: 100%  97% 97%  Weight:      Height:        General: Pt is alert, awake, not in acute distress, chronically sick looking but not in any distress.  On room air.  Was eating breakfast on my arrival. Cardiovascular: Irregularly irregular, S1/S2 +, no rubs, no gallops Respiratory: CTA bilaterally, no wheezing, no rhonchi Abdominal: Soft, NT, ND, bowel sounds +, obese and pendulous Extremities: no edema, no cyanosis, no localized tenderness or deformity Facial exam: Her forehead lesions now drying up and flaky. Peripheral margins are receding and less erythematous. Eyes open, minimal conjunctival injection, pupil normal, vision bilateral equal. Mild swelling of eyelids.    The results of significant diagnostics from this hospitalization (including imaging, microbiology, ancillary and laboratory) are listed below for  reference.     Microbiology: Recent Results (from the past 240 hour(s))  Blood Culture (routine x 2)     Status: None (Preliminary result)   Collection Time: 04/22/19  8:51 PM   Specimen: BLOOD  Result Value Ref Range Status   Specimen Description BLOOD LEFT ANTECUBITAL  Final   Special Requests   Final    BOTTLES DRAWN AEROBIC AND ANAEROBIC Blood Culture adequate volume   Culture   Final    NO GROWTH 4 DAYS Performed at [facility], [address] [place], [place] [address]    Report Status PENDING  Incomplete  SARS CORONAVIRUS 2 (TAT 6-24 HRS) Nasopharyngeal Nasopharyngeal Swab     Status: Abnormal   Collection Time: 04/22/19 11:43 PM   Specimen: Nasopharyngeal Swab  Result Value Ref Range Status   SARS Coronavirus 2 POSITIVE (A) NEGATIVE Final    Comment: RESULT CALLED TO, READ BACK BY AND VERIFIED WITH: Colleen Can RN 9:30 04/23/19 ([email or web address]) (NOTE) SARS-CoV-2 target nucleic acids are DETECTED. The SARS-CoV-2 RNA is generally detectable in upper and lower respiratory specimens during the acute phase of infection. Positive results are indicative of the presence of SARS-CoV-2 RNA. Clinical correlation with patient history and other diagnostic information is  necessary to determine patient infection status. Positive results do not rule out bacterial infection or co-infection with other viruses.  The expected result is Negative. Fact Sheet for Patients: SugarRoll.be Fact Sheet for Healthcare Providers: https://www.woods-mathews.com/ This test is not yet approved or cleared by the [place] FDA and  has been authorized for detection and/or diagnosis of SARS-CoV-2 by FDA under an Emergency Use Authorization (EUA). This EUA will remain  in effect (meaning this test can be used) for the du ration of the COVID-19 declaration under Section 564(b)(1) of the Act, 21 U.S.C. section 360bbb-3(b)(1), unless the authorization is terminated  or revoked sooner. Performed at [facility], [place] [address] [place], [place] [address]   Urine culture     Status: Abnormal   Collection Time: 04/23/19  1:10 AM   Specimen: In/Out Cath Urine  Result Value Ref Range Status   Specimen Description IN/OUT CATH URINE  Final   Special Requests   Final    NONE Performed at [facility], [place] [address] [place], [place] [address]    Culture 2,000 COLONIES/mL ENTEROCOCCUS FAECALIS (A)  Final   Report Status 04/25/2019 FINAL  Final   Organism ID, Bacteria ENTEROCOCCUS FAECALIS (A)  Final      Susceptibility   Enterococcus faecalis - MIC*    AMPICILLIN <=2  SENSITIVE Sensitive     NITROFURANTOIN <=16 SENSITIVE Sensitive     VANCOMYCIN 1 SENSITIVE Sensitive     * 2,000 COLONIES/mL ENTEROCOCCUS FAECALIS  Blood Culture (routine x 2)     Status: None (Preliminary result)   Collection Time: 04/23/19  2:24 AM   Specimen: BLOOD RIGHT FOREARM  Result Value Ref Range Status   Specimen Description BLOOD RIGHT FOREARM  Final   Special Requests   Final    BOTTLES DRAWN AEROBIC AND ANAEROBIC Blood Culture adequate volume   Culture   Final    NO GROWTH 3 DAYS Performed at [facility], [place] [address], [place], [place] [address]    Report Status PENDING  Incomplete  Virus culture     Status: None   Collection Time: 04/23/19 11:25 AM   Specimen: Eye, Right  Result Value Ref Range Status   Viral Culture Comment  Final    Comment: (NOTE) Preliminary Report: No virus isolated at 24 hours. Next report to follow after 4 days. Performed At: [facility] [place], [place] [id number] Rush Farmer MD UK:[id number]    Source of Sample RIGHT EYE  Final    Comment: Performed at [facility], [place] [address], [place],  [address]     Labs: BNP (last 3 results) No results for input(s): BNP in the last 8760 hours. Basic Metabolic Panel: Recent Labs  Lab 04/22/19 2051 04/23/19 0214 04/23/19 0658  04/24/19 0116 04/25/19 0533  NA 133*  --  134* 137 131*  K 4.3  --  3.9 4.0 3.8  CL 91*  --  91* 91* 92*  CO2 29  --  30 34* 30  GLUCOSE 207*  --  219* 90 75  BUN 16  --  14 11 12   CREATININE 1.42* 1.34* 1.30* 1.08* 1.10*  CALCIUM 9.9  --  9.6 9.8 9.0  MG  --   --   --  1.8 1.7  PHOS  --   --   --   --  3.0   Liver Function Tests: Recent Labs  Lab 04/22/19 2051 04/23/19 0658  AST 60* 43*  ALT 48* 40  ALKPHOS 533* 480*  BILITOT 1.7* 1.2  PROT 6.4* 5.8*  ALBUMIN 2.8* 2.5*   No results for input(s): LIPASE, AMYLASE in the last 168 hours. Recent Labs  Lab 04/22/19 2052  AMMONIA 30   CBC: Recent Labs  Lab 04/22/19 2051 04/23/19 0214 04/23/19 0658 04/24/19 0116 04/25/19 0533 04/26/19 0300 04/27/19 0625  WBC 6.6   < > 6.3 6.3 7.1 3.7* 7.5  NEUTROABS 4.6  --  3.7  --   --   --   --   HGB 13.8   < > 13.2 13.2 12.8 12.4 13.0  HCT 43.6   < > 40.8 40.1 38.6 36.9 38.9  MCV 90.5   < > 90.3 89.1 87.7 87.2 87.0  PLT 298   < > 313 293 274 287 318   < > = values in this interval not displayed.   Cardiac Enzymes: No results for input(s): CKTOTAL, CKMB, CKMBINDEX, TROPONINI in the last 168 hours. BNP: Invalid input(s): POCBNP CBG: Recent Labs  Lab 04/26/19 0803 04/26/19 1309 04/26/19 1806 04/26/19 2242 04/27/19 0839  GLUCAP 365* 245* 241* 257* 183*   D-Dimer No results for input(s): DDIMER in the last 72 hours. Hgb A1c No results for input(s): HGBA1C in the last 72 hours. Lipid Profile No results for input(s): CHOL, HDL,  LDLCALC, TRIG, CHOLHDL, LDLDIRECT in the last 72 hours. Thyroid function studies No results for input(s): TSH, T4TOTAL, T3FREE, THYROIDAB in the last 72 hours.  Invalid input(s): FREET3 Anemia work up No results for input(s): VITAMINB12, FOLATE, FERRITIN, TIBC, IRON, RETICCTPCT in the last 72 hours. Urinalysis    Component Value Date/Time   COLORURINE AMBER (A) 04/23/2019 0131   APPEARANCEUR HAZY (A) 04/23/2019 0131   LABSPEC 1.024  04/23/2019 0131   PHURINE 5.0 04/23/2019 0131   GLUCOSEU NEGATIVE 04/23/2019 0131   HGBUR SMALL (A) 04/23/2019 0131   BILIRUBINUR NEGATIVE 04/23/2019 0131   KETONESUR 5 (A) 04/23/2019 0131   PROTEINUR 30 (A) 04/23/2019 0131   UROBILINOGEN 0.2 01/13/2011 1819   NITRITE NEGATIVE 04/23/2019 0131   LEUKOCYTESUR TRACE (A) 04/23/2019 0131   Sepsis Labs Invalid input(s): PROCALCITONIN,  WBC,  LACTICIDVEN Microbiology Recent Results (from the past 240 hour(s))  Blood Culture (routine x 2)     Status: None (Preliminary result)   Collection Time: 04/22/19  8:51 PM   Specimen: BLOOD  Result Value Ref Range Status   Specimen Description BLOOD LEFT ANTECUBITAL  Final   Special Requests   Final    BOTTLES DRAWN AEROBIC AND ANAEROBIC Blood Culture adequate volume   Culture   Final    NO GROWTH 4 DAYS Performed at [facility], [address] [place], [place] [address]    Report Status PENDING  Incomplete  SARS CORONAVIRUS 2 (TAT 6-24 HRS) Nasopharyngeal Nasopharyngeal Swab     Status: Abnormal   Collection Time: 04/22/19 11:43 PM   Specimen: Nasopharyngeal Swab  Result Value Ref Range Status   SARS Coronavirus 2 POSITIVE (A) NEGATIVE Final    Comment: RESULT CALLED TO, READ BACK BY AND VERIFIED WITH: Colleen Can RN 9:30 04/23/19 ([email or web address]) (NOTE) SARS-CoV-2 target nucleic acids are DETECTED. The SARS-CoV-2 RNA is generally detectable in upper and lower respiratory specimens during the acute phase of infection. Positive results are indicative of the presence of SARS-CoV-2 RNA. Clinical correlation with patient history and other diagnostic information is  necessary to determine patient infection status. Positive results do not rule out bacterial infection or co-infection with other viruses.  The expected result is Negative. Fact Sheet for Patients: SugarRoll.be Fact Sheet for Healthcare Providers: https://www.woods-mathews.com/ This test is not  yet approved or cleared by the [place] FDA and  has been authorized for detection and/or diagnosis of SARS-CoV-2 by FDA under an Emergency Use Authorization (EUA). This EUA will remain  in effect (meaning this test can be used) for the du ration of the COVID-19 declaration under Section 564(b)(1) of the Act, 21 U.S.C. section 360bbb-3(b)(1), unless the authorization is terminated or revoked sooner. Performed at [facility], [place] [address], [place], [place] [address]   Urine culture     Status: Abnormal   Collection Time: 04/23/19  1:10 AM   Specimen: In/Out Cath Urine  Result Value Ref Range Status   Specimen Description IN/OUT CATH URINE  Final   Special Requests   Final    NONE Performed at [facility], [place] [address], [place], [place] [address]    Culture 2,000 COLONIES/mL ENTEROCOCCUS FAECALIS (A)  Final   Report Status 04/25/2019 FINAL  Final   Organism ID, Bacteria ENTEROCOCCUS FAECALIS (A)  Final      Susceptibility   Enterococcus faecalis - MIC*    AMPICILLIN <=2 SENSITIVE Sensitive     NITROFURANTOIN <=16 SENSITIVE Sensitive     VANCOMYCIN 1  SENSITIVE Sensitive     * 2,000 COLONIES/mL ENTEROCOCCUS FAECALIS  Blood Culture (routine x 2)     Status: None (Preliminary result)   Collection Time: 04/23/19  2:24 AM   Specimen: BLOOD RIGHT FOREARM  Result Value Ref Range Status   Specimen Description BLOOD RIGHT FOREARM  Final   Special Requests   Final    BOTTLES DRAWN AEROBIC AND ANAEROBIC Blood Culture adequate volume   Culture   Final    NO GROWTH 3 DAYS Performed at [facility], [place] [address], [place], [place] [address]    Report Status PENDING  Incomplete  Virus culture     Status: None   Collection Time: 04/23/19 11:25 AM   Specimen: Eye, Right  Result Value Ref Range Status   Viral Culture Comment  Final    Comment: (NOTE) Preliminary Report: No virus isolated at 24 hours. Next report to follow after 4 days. Performed At: [facility] [place], [place] [id number] Rush Farmer MD CR:[id number]    Source of Sample RIGHT EYE  Final    Comment: Performed at [facility], [place] [address] [place],  [address]     Time coordinating discharge:  45 minutes  SIGNED:   Barb Merino, MD  [facility] 04/27/2019, 10:14 AM

## 2019-04-27 NOTE — Discharge Summary (Addendum)
Physician Discharge Summary  Ashley Caldwell WLS:[id number] DOB: 28-May-1940 DOA: 04/22/2019  PCP: Celene Squibb, MD  Admit date: 04/22/2019 Discharge date: 04/27/2019  Admitted From: Long-term nursing home Disposition: Long-term nursing home  Recommendations for Outpatient Follow-up:  1. Follow-up with ophthalmology within 1 week 2. Follow-up with cardiology, referral sent  Discharge Condition: Stable CODE STATUS: DNR Diet recommendation: Low-carb diet  Discharge summary: Patient has history of type 2 diabetes on insulin, hypertension, fibromyalgia, hypothyroidism and morbid obesity, chronic pain syndrome on chronic opiates with pain management, recent COVID-19 infection from long-term nursing home sent to the emergency room from ophthalmologist office where she was found with A. fib with RVR.  Patient had right periorbital swelling ongoing for about 3 to 4 days, matting of the right eye lids.  She was taken to ophthalmology office, routine test showed A. fib with RVR heart rate more than 110 and sent to ER.  In the emergency room patient was hemodynamically stable.  A. fib with RVR, heart rate 110s, urinalysis normal.  CT head normal. Creatinine 1.4.  Mild elevated LFTs.  Blood glucose was 207.  Patient was treated for periorbital cellulitis with IV antibiotics and admitted to the hospital.  04/24/2019, treated with oral acyclovir and erythromycin drops, antibiotic discontinued. 04/25/2019, worsening lesions, suspected vision loss and unable to open right eye, started on IV acyclovir, oral prednisone and prednisone drops. 04/26/2019, dramatic improvement of vesicular lesions and eye symptoms. 04/27/2019, continue improvement of vesicular lesions, drying up, eye exam with some conjunctival injection mostly cleared.  Patient is normal.  1. 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 she was started on oral acyclovir and erythromycin eyedrops. 2/6, worsening local symptoms, draining lesions and patient unable to see by her right eye.  Treated as severe symptomatic HZO with IV acyclovir, prednisone drops and oral prednisone.   Patient did good clinical recovery with dramatic improvement of swelling and vision.   Airborne isolation until active lesions as per infectious disease protocol. Will need outpatient follow-up with ophthalmology. Due to severe disease and open lesions, airborne isolation is advised as per institution protocol until resolution of active lesions. Once her all forehead lesions are dry and flaky, she can come off the isolation. Due to very good response to steroids and acyclovir, will finish 7 more days of valcyclovir  for total 10 days. Oral prednisone, slow taper for 1 week. Continue prednisone eyedrops and erythromycin eyedrops to prevent secondary infection.  New onset A. fib with RVR: Heart rate is better controlled on metoprolol. Since she is not very symptomatic, will continue current dose of metoprolol.  CHA2DS2-VASc score of 5, 2D echocardiogram was normal. HAS-BLED score of 2.  Benefit outweighs risk. Discussed with patient and daughter, started on Eliquis. Will refer to cardiology for outpatient follow-up.  Referral sent. TSH suppressed, might have triggered A. fib.  Synthroid dose adjusted.  Chronic pain syndrome: On fentanyl patch and oxycodone that she will continue.  Type 2 diabetes on insulin, uncontrolled with hypoglycemia: Patient had a hypoglycemic episode in the hospital that is currently improved.   Her blood sugars improving and elevated now.  Resume long-acting insulin.  Continue prandial insulin and sliding scale insulin until she is on the steroids.   Hypertension: Blood pressures are stable.  Hypothyroidism: On Synthroid 125.  TSH 0.09, over suppressed.  Might have triggered A. fib.  Dose is  decreased to  100 mcg.  Will need recheck in 1 month.  Recent COVID-19 infection: Outside the isolation window.  Patient has done adequate clinical improvement.  She can be discharged back to long-term nursing home today.  Addendum: note edited to reflect Acyclovir changed to valacyclovir.  No other changes.  Discharge Diagnoses:  Principal Problem:   Herpes zoster ophthalmicus of right eye Active Problems:   DM type 2 (diabetes mellitus, type 2) (HCC)   HTN (hypertension), benign   Psoriatic arthritis (HCC)   Asthma   Elevated LFTs   Atrial fibrillation (HCC)   Chronic pain syndrome   Opioid dependence (HCC)   Constipation due to opioid therapy   Iatrogenic hyperthyroidism    Discharge Instructions  Discharge Instructions    Ambulatory referral to Cardiology   Complete by: As directed    New onset Afib   Call MD for:  redness, tenderness, or signs of infection (pain, swelling, redness, odor or green/yellow discharge around incision site)   Complete by: As directed    Call MD for:  severe uncontrolled pain   Complete by: As directed    Diet Carb Modified   Complete by: As directed    Discharge instructions   Complete by: As directed    Will suggest airborn precautions until lesions are all dry and crusty. If appropriately improving and clearing up continue precautions for 1 more week. Please reschedule appointment with ophthalmology within one week.   Increase activity slowly   Complete by: As directed      Allergies as of 04/27/2019      Reactions   Lyrica [pregabalin]    Amoxicillin-pot Clavulanate Nausea And Vomiting   Latex Itching, Rash      Medication List    STOP taking these medications   buprenorphine 10 MCG/HR Ptwk patch Commonly known as: Butrans   cephALEXin 500 MG capsule Commonly known as: KEFLEX   cyclobenzaprine 5 MG tablet Commonly known as: FLEXERIL   insulin glargine 100 UNIT/ML injection Commonly known as: LANTUS   insulin lispro  100 UNIT/ML injection Commonly known as: HUMALOG   ipratropium 0.02 % nebulizer solution Commonly known as: ATROVENT   methotrexate 2.5 MG tablet Commonly known as: RHEUMATREX     TAKE these medications   acetaminophen 325 MG tablet Commonly known as: TYLENOL Take 650 mg by mouth every 6 (six) hours as needed for mild pain or fever.   albuterol 108 (90 Base) MCG/ACT inhaler Commonly known as: VENTOLIN HFA Inhale 2 puffs into the lungs every 4 (four) hours as needed for wheezing or shortness of breath.   apixaban 5 MG Tabs tablet Commonly known as: ELIQUIS Take 1 tablet (5 mg total) by mouth 2 (two) times daily.   Aspercreme Lidocaine 4 % Ptch Generic drug: Lidocaine Apply 1 patch topically daily.   benzonatate 100 MG capsule Commonly known as: TESSALON Take 100 mg by mouth 4 (four) times daily as needed for cough.   cetirizine 10 MG tablet Commonly known as: ZYRTEC Take 10 mg by mouth daily at 2 PM.   chlorhexidine 0.12 % solution Commonly known as: PERIDEX Use as directed 15 mLs in the mouth or throat 2 (two) times daily.   Cranberry 450 MG Tabs Take 450 mg by mouth daily.   DULoxetine 60 MG capsule Commonly known as: CYMBALTA Take 1 capsule (60 mg total) by mouth 2 (two) times daily. What changed:   how much to take  when to take this   erythromycin ophthalmic ointment Place into  the right eye 3 (three) times daily for 7 days.   fentaNYL 50 MCG/HR Commonly known as: [place] 1 patch onto the skin every 3 (three) days. What changed: Another medication with the same name was removed. Continue taking this medication, and follow the directions you see here.   Fluticasone-Salmeterol 500-50 MCG/DOSE Aepb Commonly known as: ADVAIR Inhale 1 puff into the lungs 2 (two) times daily.   furosemide 40 MG tablet Commonly known as: LASIX Take 40 mg by mouth daily at 2 PM. What changed: Another medication with the same name was removed. Continue taking this  medication, and follow the directions you see here.   Glucagon Emergency 1 MG Kit Inject 1 mg as directed every 6 (six) hours as needed (for blood suger <60).   guaiFENesin 100 MG/5ML liquid Commonly known as: ROBITUSSIN Take 200 mg by mouth every 4 (four) hours as needed for cough.   insulin aspart 100 UNIT/ML injection Commonly known as: NovoLOG Before each meal 3 times a day, 140-199 - 2 units, 200-250 - 4 units, 251-299 - 6 units,  300-349 - 8 units,  350 or above 10 units. Dispense syringes and needles as needed, Ok to switch to PEN if approved. Substitute to any brand approved. DX DM2, Code E11.65 What changed: Another medication with the same name was removed. Continue taking this medication, and follow the directions you see here.   insulin detemir 100 UNIT/ML injection Commonly known as: LEVEMIR Inject 20-25 Units into the skin See admin instructions. Use 25 units every morning and use 20 units at bedtime   lamoTRIgine 25 MG tablet Commonly known as: LAMICTAL Take 25 mg by mouth daily at 2 PM.   levothyroxine 100 MCG tablet Commonly known as: SYNTHROID Take 1 tablet (100 mcg total) by mouth daily. What changed:   medication strength  how much to take   lisinopril 5 MG tablet Commonly known as: ZESTRIL Take 5 mg by mouth daily at 2 PM.   metFORMIN 1000 MG tablet Commonly known as: GLUCOPHAGE Take 1,000 mg by mouth daily at 2 PM.   metoprolol tartrate 25 MG tablet Commonly known as: LOPRESSOR Take 1 tablet (25 mg total) by mouth 2 (two) times daily.   Moderna COVID-19 Vaccine 100 MCG/0.5ML Susp Generic drug: COVID-19 mRNA vaccine (Moderna) Inject into the muscle once.   montelukast 10 MG tablet Commonly known as: SINGULAIR Take 10 mg by mouth daily at 2 PM.   multivitamin with minerals Tabs tablet Take 1 tablet by mouth daily at 2 PM.   ondansetron 4 MG tablet Commonly known as: ZOFRAN Take 4 mg by mouth every 6 (six) hours as needed for nausea or  vomiting.   Oxycodone HCl 10 MG Tabs Take 1 tablet (10 mg total) by mouth every 4 (four) hours as needed for up to 5 days (for breakthrough pain). What changed: Another medication with the same name was removed. Continue taking this medication, and follow the directions you see here.   polyethylene glycol 17 g packet Commonly known as: MIRALAX / GLYCOLAX Take 17 g by mouth every 3 (three) days.   prednisoLONE acetate 1 % ophthalmic suspension Commonly known as: PRED FORTE Place 1 drop into the right eye 4 (four) times daily for 7 days.   predniSONE 10 MG tablet Commonly known as: DELTASONE Take 4 tablets (40 mg total) by mouth daily with breakfast. 4 tabs daily for 2 days, 3 tabs daily for 2 days, 2 tabs daily for 2 days, 1  tab daily for 2  days Start taking on: April 28, 2019   rOPINIRole 0.5 MG tablet Commonly known as: REQUIP Take 0.5 mg by mouth at bedtime.   senna 8.6 MG Tabs tablet Commonly known as: SENOKOT Take 1 tablet by mouth daily at 2 PM.   sodium chloride 0.65 % Soln nasal spray Commonly known as: OCEAN Place 1 spray into both nostrils 3 (three) times daily as needed for congestion.   Systane Balance 0.6 % Soln Generic drug: Propylene Glycol Place 2 drops into the right eye daily.   traZODone 100 MG tablet Commonly known as: DESYREL Take 100 mg by mouth at bedtime.   valACYclovir 1000 MG tablet Commonly known as: VALTREX Take 1 tablet (1,000 mg total) by mouth 3 (three) times daily for 7 days.   Vitamin D 50 MCG (2000 UT) tablet Take 2,000 Units by mouth daily at 2 PM.       Contact information for follow-up providers    Jola Schmidt, MD. Go on 04/24/2019.   Specialty: Ophthalmology Why: at 1:15 pm to have eye examined Contact information: [address] [place] [address] [phone]            Contact information for after-discharge care    Destination    [facility] .   Service: Skilled Nursing Contact  information: [address] [place] [phone]                 Allergies  Allergen Reactions  . Lyrica [Pregabalin]   . Amoxicillin-Pot Clavulanate Nausea And Vomiting  . Latex Itching and Rash     Procedures/Studies: CT Head Wo Contrast  Result Date: 04/22/2019 CLINICAL DATA:  Mental status change EXAM: CT HEAD WITHOUT CONTRAST TECHNIQUE: Contiguous axial images were obtained from the base of the skull through the vertex without intravenous contrast. COMPARISON:  None. FINDINGS: Brain: There is no acute intracranial hemorrhage, mass effect, or edema. Gray-white differentiation is preserved. Patchy hypoattenuation in the supratentorial white matter is nonspecific may reflect mild chronic microvascular ischemic changes. Prominence of the ventricles and sulci reflects minor generalized parenchymal volume loss. There is no extra-axial fluid collection. Vascular: There is intracranial atherosclerotic calcification at the skull base. Skull: Unremarkable. Sinuses/Orbits: Right periorbital soft tissue swelling. Right frontal sinus partial opacification. Other: Mastoid air cells are clear. IMPRESSION: No acute intracranial hemorrhage, mass effect, or evidence of acute infarction. Chronic microvascular ischemic changes. Right periorbital soft tissue swelling. Electronically Signed   By: Macy Mis M.D.   On: 04/22/2019 21:53   US Abdomen Complete  Result Date: 04/23/2019 CLINICAL DATA:  Elevated LFTs EXAM: ABDOMEN ULTRASOUND COMPLETE COMPARISON:  None. FINDINGS: Gallbladder: No gallstones or wall thickening visualized. No sonographic Murphy sign noted by sonographer. Common bile duct: Diameter: Could not be visualized prior to the termination of the procedure at the request of the patient. Liver: Diffusely increased in echogenicity likely related to fatty infiltration. Portal vein is not visualized IVC: No abnormality visualized. Pancreas: Not visualized Spleen:  Size and appearance within normal limits. Right Kidney: Length: 9.9 cm. Echogenicity within normal limits. No mass or hydronephrosis visualized. Left Kidney: Length: Not visualized Abdominal aorta: No aneurysm visualized. Other findings: None. IMPRESSION: Early termination of the procedure at the patient's request. Above-noted nonvisualized structures are related to the termination of the procedure. Fatty infiltration of the liver No other focal abnormality is noted. Electronically Signed   By: Inez Catalina M.D.   On: 04/23/2019 00:04  DG Chest Port 1 View  Result Date: 04/22/2019 CLINICAL DATA:  79 year old female with weakness. EXAM: PORTABLE CHEST 1 VIEW COMPARISON:  Chest radiograph dated 04/13/2016. FINDINGS: There is cardiomegaly with vascular congestion. No focal consolidation, pleural effusion, pneumothorax. There is diffuse interstitial coarsening, chronic. No acute osseous pathology. IMPRESSION: 1. Cardiomegaly with mild vascular congestion. No focal consolidation. 2. Chronic interstitial coarsening and bronchitic changes. Electronically Signed   By: Anner Crete M.D.   On: 04/22/2019 21:25   ECHOCARDIOGRAM COMPLETE  Result Date: 04/23/2019   ECHOCARDIOGRAM REPORT   Patient Name:   IZUMI MIXON Date of Exam: 04/23/2019 Medical Rec #:  [id number]        Height:       69.0 in Accession #:    [id number]       Weight:       285.5 lb Date of Birth:  [birth year]        BSA:          2.40 m Patient Age:    79 years         BP:           110/48 mmHg Patient Gender: F                HR:           105 bpm. Exam Location:  Inpatient Procedure: 2D Echo, Cardiac Doppler and Color Doppler Indications:    I48.91* Unspeicified atrial fibrillation  History:        Patient has no prior history of Echocardiogram examinations.                 Abnormal ECG, Arrythmias:Atrial Fibrillation; Risk                 Factors:Hypertension and Diabetes. Covid 19 positive. Opioid                 use. Pneumonia.  Sonographer:     Roseanna Rainbow RDCS Referring Phys: [place]  Sonographer Comments: Patient is morbidly obese and Technically difficult study due to poor echo windows. Image acquisition challenging due to patient body habitus and Image acquisition challenging due to uncooperative patient. Exam ended because patient  was screaming and grabbed probe. Patient complained of severe head pain. RN was notified. Patient could not tolerate study. IMPRESSIONS  1. Left ventricular ejection fraction, by visual estimation, is 60 to 65%. The left ventricle has normal function. There is no left ventricular hypertrophy.  2. Left ventricular diastolic parameters are indeterminate.  3. The left ventricle has no regional wall motion abnormalities.  4. Global right ventricle has normal systolic function.The right ventricular size is normal. No increase in right ventricular wall thickness.  5. Left atrial size was moderately dilated.  6. Right atrial size was normal.  7. Moderate mitral annular calcification.  8. The mitral valve is degenerative. No evidence of mitral valve regurgitation. No evidence of mitral stenosis.  9. The tricuspid valve is normal in structure. 10. The tricuspid valve is normal in structure. Tricuspid valve regurgitation is not demonstrated. 11. The aortic valve is tricuspid. Aortic valve regurgitation is not visualized. Mild aortic valve sclerosis without stenosis. 12. The pulmonic valve was normal in structure. Pulmonic valve regurgitation is not visualized. 13. The inferior vena cava is normal in size with greater than 50% respiratory variability, suggesting right atrial pressure of 3 mmHg. 14. Limited echo due to patient termination pain and patient clothed limited apical images and no subcostal  imaging. 15. The interatrial septum was not assessed. FINDINGS  Left Ventricle: Left ventricular ejection fraction, by visual estimation, is 60 to 65%. The left ventricle has normal function. The left ventricle has no  regional wall motion abnormalities. There is no left ventricular hypertrophy. Left ventricular diastolic parameters are indeterminate. Normal left atrial pressure. Right Ventricle: The right ventricular size is normal. No increase in right ventricular wall thickness. Global RV systolic function is has normal systolic function. Left Atrium: Left atrial size was moderately dilated. Right Atrium: Right atrial size was normal in size Pericardium: There is no evidence of pericardial effusion. Mitral Valve: The mitral valve is degenerative in appearance. There is mild thickening of the mitral valve leaflet(s). There is mild calcification of the mitral valve leaflet(s). Moderate mitral annular calcification. No evidence of mitral valve regurgitation. No evidence of mitral valve stenosis by observation. Tricuspid Valve: The tricuspid valve is normal in structure. Tricuspid valve regurgitation is not demonstrated. Aortic Valve: The aortic valve is tricuspid. Aortic valve regurgitation is not visualized. Mild aortic valve sclerosis is present, with no evidence of aortic valve stenosis. Pulmonic Valve: The pulmonic valve was normal in structure. Pulmonic valve regurgitation is not visualized. Pulmonic regurgitation is not visualized. Aorta: The aortic root, ascending aorta and aortic arch are all structurally normal, with no evidence of dilitation or obstruction. Venous: The inferior vena cava is normal in size with greater than 50% respiratory variability, suggesting right atrial pressure of 3 mmHg. IAS/Shunts: The interatrial septum was not assessed. There is no evidence of a patent foramen ovale. No ventricular septal defect is seen or detected. There is no evidence of an atrial septal defect. Additional Comments: Limited echo due to patient termination pain and patient clothed limited apical images and no subcostal imaging.  LEFT VENTRICLE PLAX 2D LVIDd:         5.00 cm LVIDs:         4.30 cm LV PW:         1.20 cm LV IVS:         0.90 cm LVOT diam:     1.90 cm LV SV:         35 ml LV SV Index:   13.67 LVOT Area:     2.84 cm  LEFT ATRIUM           Index LA diam:      4.40 cm 1.83 cm/m LA Vol (A4C): 56.6 ml 23.55 ml/m   AORTA Ao Root diam: 3.30 cm Ao Asc diam:  3.50 cm  SHUNTS Systemic Diam: 1.90 cm  Jenkins Rouge MD Electronically signed by Jenkins Rouge MD Signature Date/Time: 04/23/2019/11:27:11 AM    Final      Subjective: Patient was seen and examined.  No overnight events.  She is very happy to be able to look through the right eye.  Denies any pain in the eyes.  She does have her pain in her joints and legs that has been always there. Remains afebrile. Happy to be able to return to nursing home.   Discharge Exam: Vitals:   04/26/19 2134 04/27/19 0621  BP: (!) 104/42 124/65  Pulse: 80 68  Resp: 18 18  Temp: 98 F (36.7 C) 99.7 F (37.6 C)  SpO2: 97% 97%   Vitals:   04/26/19 1324 04/26/19 2053 04/26/19 2134 04/27/19 0621  BP: 106/71  (!) 104/42 124/65  Pulse: 74 70 80 68  Resp: 18 18 18 18   Temp: 98.1 F (36.7 C)  98  F (36.7 C) 99.7 F (37.6 C)  TempSrc: Oral  Oral Oral  SpO2: 100%  97% 97%  Weight:      Height:        General: Pt is alert, awake, not in acute distress, chronically sick looking but not in any distress.  On room air.  Was eating breakfast on my arrival. Cardiovascular: Irregularly irregular, S1/S2 +, no rubs, no gallops Respiratory: CTA bilaterally, no wheezing, no rhonchi Abdominal: Soft, NT, ND, bowel sounds +, obese and pendulous Extremities: no edema, no cyanosis, no localized tenderness or deformity Facial exam: Her forehead lesions now drying up and flaky. Peripheral margins are receding and less erythematous. Eyes open, minimal conjunctival injection, pupil normal, vision bilateral equal. Mild swelling of eyelids.    The results of significant diagnostics from this hospitalization (including imaging, microbiology, ancillary and laboratory) are listed below for  reference.     Microbiology: Recent Results (from the past 240 hour(s))  Blood Culture (routine x 2)     Status: None (Preliminary result)   Collection Time: 04/22/19  8:51 PM   Specimen: BLOOD  Result Value Ref Range Status   Specimen Description BLOOD LEFT ANTECUBITAL  Final   Special Requests   Final    BOTTLES DRAWN AEROBIC AND ANAEROBIC Blood Culture adequate volume   Culture   Final    NO GROWTH 4 DAYS Performed at [facility], [address], [place], [place] [address]    Report Status PENDING  Incomplete  SARS CORONAVIRUS 2 (TAT 6-24 HRS) Nasopharyngeal Nasopharyngeal Swab     Status: Abnormal   Collection Time: 04/22/19 11:43 PM   Specimen: Nasopharyngeal Swab  Result Value Ref Range Status   SARS Coronavirus 2 POSITIVE (A) NEGATIVE Final    Comment: RESULT CALLED TO, READ BACK BY AND VERIFIED WITH: Colleen Can RN 9:30 04/23/19 ([email or web address]) (NOTE) SARS-CoV-2 target nucleic acids are DETECTED. The SARS-CoV-2 RNA is generally detectable in upper and lower respiratory specimens during the acute phase of infection. Positive results are indicative of the presence of SARS-CoV-2 RNA. Clinical correlation with patient history and other diagnostic information is  necessary to determine patient infection status. Positive results do not rule out bacterial infection or co-infection with other viruses.  The expected result is Negative. Fact Sheet for Patients: SugarRoll.be Fact Sheet for Healthcare Providers: https://www.woods-mathews.com/ This test is not yet approved or cleared by the [place] FDA and  has been authorized for detection and/or diagnosis of SARS-CoV-2 by FDA under an Emergency Use Authorization (EUA). This EUA will remain  in effect (meaning this test can be used) for the du ration of the COVID-19 declaration under Section 564(b)(1) of the Act, 21 U.S.C. section 360bbb-3(b)(1), unless the authorization is terminated  or revoked sooner. Performed at [facility], [place] [address] [place], [place] [address]   Urine culture     Status: Abnormal   Collection Time: 04/23/19  1:10 AM   Specimen: In/Out Cath Urine  Result Value Ref Range Status   Specimen Description IN/OUT CATH URINE  Final   Special Requests   Final    NONE Performed at [facility], [place] [address], [place], [place] [address]    Culture 2,000 COLONIES/mL ENTEROCOCCUS FAECALIS (A)  Final   Report Status 04/25/2019 FINAL  Final   Organism ID, Bacteria ENTEROCOCCUS FAECALIS (A)  Final      Susceptibility   Enterococcus faecalis - MIC*    AMPICILLIN <=2 SENSITIVE Sensitive  NITROFURANTOIN <=16 SENSITIVE Sensitive     VANCOMYCIN 1 SENSITIVE Sensitive     * 2,000 COLONIES/mL ENTEROCOCCUS FAECALIS  Blood Culture (routine x 2)     Status: None (Preliminary result)   Collection Time: 04/23/19  2:24 AM   Specimen: BLOOD RIGHT FOREARM  Result Value Ref Range Status   Specimen Description BLOOD RIGHT FOREARM  Final   Special Requests   Final    BOTTLES DRAWN AEROBIC AND ANAEROBIC Blood Culture adequate volume   Culture   Final    NO GROWTH 3 DAYS Performed at [facility], [place] [address], [place], [place] [address]    Report Status PENDING  Incomplete  Virus culture     Status: None   Collection Time: 04/23/19 11:25 AM   Specimen: Eye, Right  Result Value Ref Range Status   Viral Culture Comment  Final    Comment: (NOTE) Preliminary Report: No virus isolated at 24 hours. Next report to follow after 4 days. Performed At: [facility] [place], [place] [id number] Rush Farmer MD [id number]    Source of Sample RIGHT EYE  Final    Comment: Performed at [facility], [place] [address], [place], [place] [address]     Labs: BNP (last 3 results) No results for input(s): BNP in the last 8760 hours. Basic Metabolic Panel: Recent Labs  Lab 04/22/19 2051 04/23/19 0214 04/23/19 0658  04/24/19 0116 04/25/19 0533  NA 133*  --  134* 137 131*  K 4.3  --  3.9 4.0 3.8  CL 91*  --  91* 91* 92*  CO2 29  --  30 34* 30  GLUCOSE 207*  --  219* 90 75  BUN 16  --  14 11 12   CREATININE 1.42* 1.34* 1.30* 1.08* 1.10*  CALCIUM 9.9  --  9.6 9.8 9.0  MG  --   --   --  1.8 1.7  PHOS  --   --   --   --  3.0   Liver Function Tests: Recent Labs  Lab 04/22/19 2051 04/23/19 0658  AST 60* 43*  ALT 48* 40  ALKPHOS 533* 480*  BILITOT 1.7* 1.2  PROT 6.4* 5.8*  ALBUMIN 2.8* 2.5*   No results for input(s): LIPASE, AMYLASE in the last 168 hours. Recent Labs  Lab 04/22/19 2052  AMMONIA 30   CBC: Recent Labs  Lab 04/22/19 2051 04/23/19 0214 04/23/19 0658 04/24/19 0116 04/25/19 0533 04/26/19 0300 04/27/19 0625  WBC 6.6   < > 6.3 6.3 7.1 3.7* 7.5  NEUTROABS 4.6  --  3.7  --   --   --   --   HGB 13.8   < > 13.2 13.2 12.8 12.4 13.0  HCT 43.6   < > 40.8 40.1 38.6 36.9 38.9  MCV 90.5   < > 90.3 89.1 87.7 87.2 87.0  PLT 298   < > 313 293 274 287 318   < > = values in this interval not displayed.   Cardiac Enzymes: No results for input(s): CKTOTAL, CKMB, CKMBINDEX, TROPONINI in the last 168 hours. BNP: Invalid input(s): POCBNP CBG: Recent Labs  Lab 04/26/19 0803 04/26/19 1309 04/26/19 1806 04/26/19 2242 04/27/19 0839  GLUCAP 365* 245* 241* 257* 183*   D-Dimer No results for input(s): DDIMER in the last 72 hours. Hgb A1c No results for input(s): HGBA1C in the last 72 hours. Lipid Profile No results for input(s): CHOL, HDL, LDLCALC, TRIG, CHOLHDL, LDLDIRECT in the  last 72 hours. Thyroid function studies No results for input(s): TSH, T4TOTAL, T3FREE, THYROIDAB in the last 72 hours.  Invalid input(s): FREET3 Anemia work up No results for input(s): VITAMINB12, FOLATE, FERRITIN, TIBC, IRON, RETICCTPCT in the last 72 hours. Urinalysis    Component Value Date/Time   COLORURINE AMBER (A) 04/23/2019 0131   APPEARANCEUR HAZY (A) 04/23/2019 0131   LABSPEC 1.024  04/23/2019 0131   PHURINE 5.0 04/23/2019 0131   GLUCOSEU NEGATIVE 04/23/2019 0131   HGBUR SMALL (A) 04/23/2019 0131   BILIRUBINUR NEGATIVE 04/23/2019 0131   KETONESUR 5 (A) 04/23/2019 0131   PROTEINUR 30 (A) 04/23/2019 0131   UROBILINOGEN 0.2 01/13/2011 1819   NITRITE NEGATIVE 04/23/2019 0131   LEUKOCYTESUR TRACE (A) 04/23/2019 0131   Sepsis Labs Invalid input(s): PROCALCITONIN,  WBC,  LACTICIDVEN Microbiology Recent Results (from the past 240 hour(s))  Blood Culture (routine x 2)     Status: None (Preliminary result)   Collection Time: 04/22/19  8:51 PM   Specimen: BLOOD  Result Value Ref Range Status   Specimen Description BLOOD LEFT ANTECUBITAL  Final   Special Requests   Final    BOTTLES DRAWN AEROBIC AND ANAEROBIC Blood Culture adequate volume   Culture   Final    NO GROWTH 4 DAYS Performed at [facility], [address] [place], [place] [address]    Report Status PENDING  Incomplete  SARS CORONAVIRUS 2 (TAT 6-24 HRS) Nasopharyngeal Nasopharyngeal Swab     Status: Abnormal   Collection Time: 04/22/19 11:43 PM   Specimen: Nasopharyngeal Swab  Result Value Ref Range Status   SARS Coronavirus 2 POSITIVE (A) NEGATIVE Final    Comment: RESULT CALLED TO, READ BACK BY AND VERIFIED WITH: Colleen Can RN 9:30 04/23/19 ([email or web address]) (NOTE) SARS-CoV-2 target nucleic acids are DETECTED. The SARS-CoV-2 RNA is generally detectable in upper and lower respiratory specimens during the acute phase of infection. Positive results are indicative of the presence of SARS-CoV-2 RNA. Clinical correlation with patient history and other diagnostic information is  necessary to determine patient infection status. Positive results do not rule out bacterial infection or co-infection with other viruses.  The expected result is Negative. Fact Sheet for Patients: SugarRoll.be Fact Sheet for Healthcare Providers: https://www.woods-mathews.com/ This test is not  yet approved or cleared by the [place] FDA and  has been authorized for detection and/or diagnosis of SARS-CoV-2 by FDA under an Emergency Use Authorization (EUA). This EUA will remain  in effect (meaning this test can be used) for the du ration of the COVID-19 declaration under Section 564(b)(1) of the Act, 21 U.S.C. section 360bbb-3(b)(1), unless the authorization is terminated or revoked sooner. Performed at [facility], [place] [address] [place],  [address]   Urine culture     Status: Abnormal   Collection Time: 04/23/19  1:10 AM   Specimen: In/Out Cath Urine  Result Value Ref Range Status   Specimen Description IN/OUT CATH URINE  Final   Special Requests   Final    NONE Performed at [facility], [place] [address] [place], [place] [address]    Culture 2,000 COLONIES/mL ENTEROCOCCUS FAECALIS (A)  Final   Report Status 04/25/2019 FINAL  Final   Organism ID, Bacteria ENTEROCOCCUS FAECALIS (A)  Final      Susceptibility   Enterococcus faecalis - MIC*    AMPICILLIN <=2 SENSITIVE Sensitive     NITROFURANTOIN <=16 SENSITIVE Sensitive     VANCOMYCIN 1 SENSITIVE Sensitive     *  2,000 COLONIES/mL ENTEROCOCCUS FAECALIS  Blood Culture (routine x 2)     Status: None (Preliminary result)   Collection Time: 04/23/19  2:24 AM   Specimen: BLOOD RIGHT FOREARM  Result Value Ref Range Status   Specimen Description BLOOD RIGHT FOREARM  Final   Special Requests   Final    BOTTLES DRAWN AEROBIC AND ANAEROBIC Blood Culture adequate volume   Culture   Final    NO GROWTH 3 DAYS Performed at [facility], [place] [address], [place], [place] [address]    Report Status PENDING  Incomplete  Virus culture     Status: None   Collection Time: 04/23/19 11:25 AM   Specimen: Eye, Right  Result Value Ref Range Status   Viral Culture Comment  Final    Comment: (NOTE) Preliminary Report: No virus isolated at 24 hours. Next report to follow after 4 days. Performed At: [facility] [place], [place] [id number] Rush Farmer MD [id number]    Source of Sample RIGHT EYE  Final    Comment: Performed at [facility], [place] [address] [place], [place] [address]     Time coordinating discharge:  45 minutes  SIGNED:   Barb Merino, MD  [facility] 04/27/2019, 12:46 PM

## 2019-04-28 LAB — CULTURE, BLOOD (ROUTINE X 2)
Culture: NO GROWTH
Special Requests: ADEQUATE

## 2019-05-01 LAB — CULTURE, BLOOD (ROUTINE X 2)
Culture: NO GROWTH
Special Requests: ADEQUATE

## 2019-05-01 LAB — VIRUS CULTURE

## 2019-05-06 ENCOUNTER — Encounter: Payer: Self-pay | Admitting: Internal Medicine

## 2019-06-17 ENCOUNTER — Emergency Department (HOSPITAL_COMMUNITY)
Admission: EM | Admit: 2019-06-17 | Discharge: 2019-06-18 | Disposition: A | Discharge status: Home / Self Care | Payer: Medicare HMO | Attending: Emergency Medicine | Admitting: Emergency Medicine

## 2019-06-17 ENCOUNTER — Encounter (HOSPITAL_COMMUNITY): Payer: Self-pay

## 2019-06-17 ENCOUNTER — Emergency Department (HOSPITAL_COMMUNITY): Payer: Medicare HMO

## 2019-06-17 DIAGNOSIS — J45909 Unspecified asthma, uncomplicated: Secondary | ICD-10-CM | POA: Insufficient documentation

## 2019-06-17 DIAGNOSIS — Z91040 Latex allergy status: Secondary | ICD-10-CM | POA: Insufficient documentation

## 2019-06-17 DIAGNOSIS — I10 Essential (primary) hypertension: Secondary | ICD-10-CM | POA: Insufficient documentation

## 2019-06-17 DIAGNOSIS — Z9884 Bariatric surgery status: Secondary | ICD-10-CM | POA: Diagnosis not present

## 2019-06-17 DIAGNOSIS — Z862 Personal history of diseases of the blood and blood-forming organs and certain disorders involving the immune mechanism: Secondary | ICD-10-CM | POA: Insufficient documentation

## 2019-06-17 DIAGNOSIS — Z794 Long term (current) use of insulin: Secondary | ICD-10-CM | POA: Diagnosis not present

## 2019-06-17 DIAGNOSIS — R4182 Altered mental status, unspecified: Secondary | ICD-10-CM | POA: Insufficient documentation

## 2019-06-17 DIAGNOSIS — I6782 Cerebral ischemia: Secondary | ICD-10-CM | POA: Insufficient documentation

## 2019-06-17 DIAGNOSIS — Z87891 Personal history of nicotine dependence: Secondary | ICD-10-CM | POA: Insufficient documentation

## 2019-06-17 DIAGNOSIS — R410 Disorientation, unspecified: Secondary | ICD-10-CM

## 2019-06-17 DIAGNOSIS — Z79899 Other long term (current) drug therapy: Secondary | ICD-10-CM | POA: Insufficient documentation

## 2019-06-17 DIAGNOSIS — E162 Hypoglycemia, unspecified: Secondary | ICD-10-CM

## 2019-06-17 DIAGNOSIS — E11649 Type 2 diabetes mellitus with hypoglycemia without coma: Secondary | ICD-10-CM | POA: Insufficient documentation

## 2019-06-17 LAB — CBC WITH DIFFERENTIAL/PLATELET
Abs Immature Granulocytes: 0.02 10*3/uL (ref 0.00–0.07)
Basophils Absolute: 0.1 10*3/uL (ref 0.0–0.1)
Basophils Relative: 1 %
Eosinophils Absolute: 0.2 10*3/uL (ref 0.0–0.5)
Eosinophils Relative: 2 %
HCT: 38.2 % (ref 36.0–46.0)
Hemoglobin: 12.7 g/dL (ref 12.0–15.0)
Immature Granulocytes: 0 %
Lymphocytes Relative: 22 %
Lymphs Abs: 1.6 10*3/uL (ref 0.7–4.0)
MCH: 34.7 pg — ABNORMAL HIGH (ref 26.0–34.0)
MCHC: 33.2 g/dL (ref 30.0–36.0)
MCV: 104.4 fL — ABNORMAL HIGH (ref 80.0–100.0)
Monocytes Absolute: 0.7 10*3/uL (ref 0.1–1.0)
Monocytes Relative: 11 %
Neutro Abs: 4.4 10*3/uL (ref 1.7–7.7)
Neutrophils Relative %: 64 %
Platelets: 292 10*3/uL (ref 150–400)
RBC: 3.66 MIL/uL — ABNORMAL LOW (ref 3.87–5.11)
WBC: 6.9 10*3/uL (ref 4.0–10.5)
nRBC: 0 % (ref 0.0–0.2)

## 2019-06-17 LAB — LACTIC ACID, PLASMA
Lactic Acid, Venous: 1 mmol/L (ref 0.5–1.9)
Lactic Acid, Venous: 1.4 mmol/L (ref 0.5–1.9)

## 2019-06-17 LAB — BASIC METABOLIC PANEL
Anion gap: 9 (ref 5–15)
BUN: 20 mg/dL (ref 8–23)
CO2: 32 mmol/L (ref 22–32)
Calcium: 10.5 mg/dL — ABNORMAL HIGH (ref 8.9–10.3)
Chloride: 101 mmol/L (ref 98–111)
Creatinine, Ser: 1.33 mg/dL — ABNORMAL HIGH (ref 0.44–1.00)
GFR calc Af Amer: 45 mL/min — ABNORMAL LOW (ref >60)
GFR calc non Af Amer: 39 mL/min — ABNORMAL LOW (ref >60)
Glucose, Bld: 63 mg/dL — ABNORMAL LOW (ref 70–99)
Potassium: 4 mmol/L (ref 3.5–5.1)
Sodium: 142 mmol/L (ref 135–145)

## 2019-06-17 LAB — PROTIME-INR
INR: 1.2 (ref 0.8–1.2)
Prothrombin Time: 15.1 seconds (ref 11.4–15.2)

## 2019-06-17 LAB — CBG MONITORING, ED
Glucose-Capillary: 114 mg/dL — ABNORMAL HIGH (ref 70–99)
Glucose-Capillary: 121 mg/dL — ABNORMAL HIGH (ref 70–99)
Glucose-Capillary: 60 mg/dL — ABNORMAL LOW (ref 70–99)
Glucose-Capillary: 61 mg/dL — ABNORMAL LOW (ref 70–99)
Glucose-Capillary: 69 mg/dL — ABNORMAL LOW (ref 70–99)
Glucose-Capillary: 72 mg/dL (ref 70–99)
Glucose-Capillary: 80 mg/dL (ref 70–99)
Glucose-Capillary: 86 mg/dL (ref 70–99)

## 2019-06-17 LAB — HEPATIC FUNCTION PANEL
ALT: 23 U/L (ref 0–44)
AST: 28 U/L (ref 15–41)
Albumin: 2.8 g/dL — ABNORMAL LOW (ref 3.5–5.0)
Alkaline Phosphatase: 266 U/L — ABNORMAL HIGH (ref 38–126)
Bilirubin, Direct: 0.6 mg/dL — ABNORMAL HIGH (ref 0.0–0.2)
Indirect Bilirubin: 0.7 mg/dL (ref 0.3–0.9)
Total Bilirubin: 1.3 mg/dL — ABNORMAL HIGH (ref 0.3–1.2)
Total Protein: 5.8 g/dL — ABNORMAL LOW (ref 6.5–8.1)

## 2019-06-17 LAB — VALPROIC ACID LEVEL: Valproic Acid Lvl: 11 ug/mL — ABNORMAL LOW (ref 50.0–100.0)

## 2019-06-17 LAB — TSH: TSH: 1.059 u[IU]/mL (ref 0.350–4.500)

## 2019-06-17 MED ORDER — SODIUM CHLORIDE 0.9 % IV BOLUS
500.0000 mL | Freq: Once | INTRAVENOUS | Status: AC
  Administered 2019-06-17: 19:00:00 500 mL via INTRAVENOUS

## 2019-06-17 MED ORDER — BENZONATATE 100 MG PO CAPS: 100.0000 mg | ORAL_CAPSULE | Freq: Once | ORAL | Status: DC

## 2019-06-17 MED ORDER — OXYCODONE HCL 5 MG PO TABS
10.0000 mg | ORAL_TABLET | Freq: Once | ORAL | Status: AC
  Administered 2019-06-17: 18:00:00 10 mg via ORAL
  Filled 2019-06-17: qty 2

## 2019-06-17 MED ORDER — DEXTROSE 50 % IV SOLN
INTRAVENOUS | Status: AC
  Administered 2019-06-17: 12.5 g
  Filled 2019-06-17: qty 50

## 2019-06-17 NOTE — ED Notes (Signed)
Pt's CBG 69, RN gave 2 cups of orange juice and graham crackers.

## 2019-06-17 NOTE — ED Provider Notes (Signed)
Progressive confusion over several days, per SNF staff. Speech "a little off". Not eating or drinking. ?Polypharmacy  At baseline here. No evidence infection, acute process. UA pending.   CBG low on arrival. Has been eating and drinking here. Repeat CBG's ok.   UA pending. Anticipate discharge back to facility.   Urine results reviewed and there is no infection. She can be discharged back to her facility as per plan of previous treatment team.    Elpidio Anis, PA-C 06/18/19 0113    Tilden Fossa, MD 06/18/19 2212

## 2019-06-17 NOTE — Discharge Instructions (Addendum)
Your lab studies and x-rays are all reassuring. No infection or acute process has been identified. You can be discharged and should be re-evaluated by your doctor in the next several days.   Blood sugar checks here have been low normal. These should be checked frequently, especially in the setting of recurrent confusion/symptoms.

## 2019-06-17 NOTE — ED Triage Notes (Signed)
Pt BIB [facility] for eval of gradual onset confusion and weakness. Pt from [facility] assisted living. EMS called out for unconscious person, on arrival pt GCS 15, A&Ox4, never unresponsive/unconscious. Pt reports worsening pain in bottom, EMS reports foul smelling urine w/ some ? Of urinary retention of the last few days. Pt tearful in triage

## 2019-06-17 NOTE — ED Notes (Signed)
Pt given two cups of orange juice per EDP, Little.

## 2019-06-17 NOTE — ED Notes (Signed)
[phone] pts daughter [name], wants an update

## 2019-06-17 NOTE — ED Notes (Signed)
Sandwich given 

## 2019-06-17 NOTE — ED Notes (Signed)
The pts cbg remains low

## 2019-06-17 NOTE — ED Notes (Signed)
The pt has a run of t-t

## 2019-06-17 NOTE — ED Notes (Signed)
Pt CBG 60, pt refusing PO orange juice/crackers. RN gave 1/2 amp d50

## 2019-06-17 NOTE — ED Provider Notes (Signed)
[facility] EMERGENCY DEPARTMENT Provider Note   CSN: [id number] Arrival date & time: 06/17/19  1311     History Chief Complaint  Patient presents with  . Altered Mental Status    Ashley Caldwell is a 79 y.o. female.  Patient with history of type 2 diabetes on insulin, hypertension, fibromyalgia, hypothyroidism, morbid obesity, chronic pain syndrome on chronic opiates with pain management, recent COVID-19 infection, recent admission for afib and herpes zoster ophthalmicus -- presents from long-term nursing home with complaint of altered mental status. On exam, she is crying, is not able to tell me why she was sent to the emergency department.  She only tells me that she is depressed clinically that all of her friends are gone.  She mentions being upset because she has been in the nursing home for 3 years.  Level 5 caveat due to altered mental status.  From provider at [facility]: Patient is very depressed with a labile mood at baseline.  She has had a noticeable increase in confusion over the past 48 hours with decreased oral intake, little to no urination.  Patient has had some difficulty speaking and swallowing at times especially last night and this morning and this improved this afternoon.  She has been taking her medications.  Provider there was concerned that she was on too many medications.    From daughter at bedside: Patient has been more disoriented since her Covid diagnosis in January.  Typically she is very expressive and conversant, sometimes does not know the day of the week.  She is more confused than her baseline today.  Daughter had not been able to get a hold of mother by phone over the past 2 days.  She does report that patient has been talking about some things that have not happened.        Past Medical History:  Diagnosis Date  . Arthritis   . Asthma   . CFS (chronic fatigue syndrome)   . Compression fracture of vertebral column ([place])  01/16/2011   per CXR  . DDD (degenerative disc disease), lumbar 01/18/2016  . Diabetes mellitus   . Elevated cholesterol 01/18/2016  . Fibromyalgia 01/18/2016  . Fibromyositis   . Hashimoto's thyroiditis   . HTN (hypertension), benign   . Hypoglycemia associated with diabetes ([place]) 01/16/2011  . Morbid obesity ([place])   . Osteoarthritis of both knees 01/18/2016  . Pneumonia 12/2010  . Psoriasis 01/18/2016  . Psoriatic arthritis ([place])   . Thyroid disease     Patient Active Problem List   Diagnosis Date Noted  . Elevated LFTs 04/23/2019  . Atrial fibrillation ([name]) 04/23/2019  . Chronic pain syndrome 04/23/2019  . Opioid dependence ([place]) 04/23/2019  . Constipation due to opioid therapy 04/23/2019  . 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  . 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  . High risk medication use 06/12/2016  . Closed fracture of right proximal humerus 05/01/2016  . History of renal failure 04/26/2016  . Primary osteoarthritis of both knees 04/26/2016  . Renal failure 04/13/2016  . Acute renal failure ([place]) 04/13/2016  . Psoriasis 01/18/2016  . Fibromyalgia 01/18/2016  . DDD (degenerative disc disease), lumbar 01/18/2016  . Osteoarthritis of both knees 01/18/2016  . Elevated cholesterol 01/18/2016  . Hypoglycemia associated with diabetes ([place]) 01/16/2011  . Compression fracture of vertebral column ([place]) 01/16/2011  . Hyponatremia 01/14/2011  . Pneumonia 01/13/2011  . Hyperglycemia 01/13/2011  . Weakness generalized 01/13/2011  . DM type 2 (diabetes mellitus, type  2) ([place]) 01/13/2011  . HTN (hypertension), benign 01/13/2011  . Psoriatic arthritis ([place]) 01/13/2011  . Anemia 01/13/2011  . Thyroiditis, chronic 01/13/2011  . Morbid obesity ([place]) 01/13/2011  . Asthma 01/13/2011    Past Surgical History:  Procedure Laterality Date  . ABDOMINAL HYSTERECTOMY    . ABDOMINAL SURGERY     lapband  . CATARACT EXTRACTION    . KNEE SURGERY       OB History     Gravida      Para      Term      Preterm      AB      Living  2     SAB      TAB      Ectopic      Multiple      Live Births              Family History  Problem Relation Age of Onset  . Diabetes Father   . Hypertension Mother   . Stroke Mother   . Cancer Brother   . Cancer Brother     Social History   Tobacco Use  . Smoking status: Former Smoker    Years: 20.00    Types: Cigarettes    Quit date: 01/19/1991    Years since quitting: 28.4  . Smokeless tobacco: Never Used  Substance Use Topics  . Alcohol use: No  . Drug use: No    Home Medications Prior to Admission medications   Medication Sig Start Date End Date Taking? Authorizing Provider  acetaminophen (TYLENOL) 325 MG tablet Take 650 mg by mouth every 6 (six) hours as needed for mild pain or fever.    [provider]  albuterol (PROVENTIL HFA;VENTOLIN HFA) 108 (90 BASE) MCG/ACT inhaler Inhale 2 puffs into the lungs every 4 (four) hours as needed for wheezing or shortness of breath. 01/16/11 04/22/28  Rexene Alberts, MD  apixaban (ELIQUIS) 5 MG TABS tablet Take 1 tablet (5 mg total) by mouth 2 (two) times daily. 04/27/19 05/27/19  Barb Merino, MD  benzonatate (TESSALON) 100 MG capsule Take 100 mg by mouth 4 (four) times daily as needed for cough.    [provider]  cetirizine (ZYRTEC) 10 MG tablet Take 10 mg by mouth daily at 2 PM.     [provider]  chlorhexidine (PERIDEX) 0.12 % solution Use as directed 15 mLs in the mouth or throat 2 (two) times daily.    [provider]  Cholecalciferol (VITAMIN D) 50 MCG (2000 UT) tablet Take 2,000 Units by mouth daily at 2 PM.    [provider]  COVID-19 mRNA vaccine, Moderna, (MODERNA COVID-19 VACCINE) 100 MCG/0.5ML SUSP Inject into the muscle once.    [provider]  Cranberry 450 MG TABS Take 450 mg by mouth daily.    [provider]  DULoxetine (CYMBALTA) 60 MG capsule Take 1 capsule (60 mg  total) by mouth 2 (two) times daily. Patient taking differently: Take 120 mg by mouth daily.  01/19/16 04/22/28  Panwala, Naitik, PA-C  fentaNYL (DURAGESIC) 50 MCG/HR Place 1 patch onto the skin every 3 (three) days. 04/24/19   Barb Merino, MD  Fluticasone-Salmeterol (ADVAIR) 500-50 MCG/DOSE AEPB Inhale 1 puff into the lungs 2 (two) times daily.    [provider]  furosemide (LASIX) 40 MG tablet Take 40 mg by mouth daily at 2 PM.    [provider]  Glucagon, rDNA, (GLUCAGON EMERGENCY) 1 MG KIT Inject 1  mg as directed every 6 (six) hours as needed (for blood suger <60).    [provider]  guaiFENesin (ROBITUSSIN) 100 MG/5ML liquid Take 200 mg by mouth every 4 (four) hours as needed for cough.     [provider]  insulin aspart (NOVOLOG) 100 UNIT/ML injection Before each meal 3 times a day, 140-199 - 2 units, 200-250 - 4 units, 251-299 - 6 units,  300-349 - 8 units,  350 or above 10 units. Dispense syringes and needles as needed, Ok to switch to PEN if approved. Substitute to any brand approved. DX DM2, Code E11.65 Patient not taking: Reported on 06/13/2016 04/16/16   Thurnell Lose, MD  insulin detemir (LEVEMIR) 100 UNIT/ML injection Inject 20-25 Units into the skin See admin instructions. Use 25 units every morning and use 20 units at bedtime    [provider]  lamoTRIgine (LAMICTAL) 25 MG tablet Take 25 mg by mouth daily at 2 PM.     [provider]  levothyroxine (SYNTHROID) 100 MCG tablet Take 1 tablet (100 mcg total) by mouth daily. 04/24/19   Barb Merino, MD  Lidocaine (ASPERCREME LIDOCAINE) 4 % PTCH Apply 1 patch topically daily.    [provider]  lisinopril (PRINIVIL,ZESTRIL) 5 MG tablet Take 5 mg by mouth daily at 2 PM.     [provider]  metFORMIN (GLUCOPHAGE) 1000 MG tablet Take 1,000 mg by mouth daily at 2 PM.    [provider]  metoprolol tartrate (LOPRESSOR) 25 MG tablet Take 1 tablet (25 mg total)  by mouth 2 (two) times daily. 04/27/19 05/27/19  Barb Merino, MD  montelukast (SINGULAIR) 10 MG tablet Take 10 mg by mouth daily at 2 PM.     [provider]  Multiple Vitamin (MULTIVITAMIN WITH MINERALS) TABS tablet Take 1 tablet by mouth daily at 2 PM.    [provider]  ondansetron (ZOFRAN) 4 MG tablet Take 4 mg by mouth every 6 (six) hours as needed for nausea or vomiting.    [provider]  polyethylene glycol (MIRALAX / GLYCOLAX) 17 g packet Take 17 g by mouth every 3 (three) days.    [provider]  predniSONE (DELTASONE) 10 MG tablet Take 4 tablets (40 mg total) by mouth daily with breakfast. 4 tabs daily for 2 days, 3 tabs daily for 2 days, 2 tabs daily for 2 days, 1 tab daily for 2  days 04/28/19   Barb Merino, MD  Propylene Glycol (SYSTANE BALANCE) 0.6 % SOLN Place 2 drops into the right eye daily.    [provider]  rOPINIRole (REQUIP) 0.5 MG tablet Take 0.5 mg by mouth at bedtime.    [provider]  senna (SENOKOT) 8.6 MG TABS tablet Take 1 tablet by mouth daily at 2 PM.    [provider]  sodium chloride (OCEAN) 0.65 % SOLN nasal spray Place 1 spray into both nostrils 3 (three) times daily as needed for congestion.    [provider]  traZODone (DESYREL) 100 MG tablet Take 100 mg by mouth at bedtime.    [provider]  etanercept (ENBREL) 50 MG/ML injection Inject 50 mg into the skin once a week. Give on Saturday!! Patient has not given injection in 2 weeks!!!  01/19/16  [provider]    Allergies    Lyrica [pregabalin], Amoxicillin-pot clavulanate, and Latex  Review of Systems   Review of Systems  Unable to perform ROS: Mental status change  Physical Exam Updated Vital Signs BP (!) 163/136 (BP Location: Right Arm)   Pulse 74   Temp 98.3 F (36.8 C) (Oral)   Resp 16   Ht 5' 9" (1.753 m)   Wt 130 kg   SpO2 93%   BMI 42.32 kg/m   Physical Exam Vitals and nursing note  reviewed.  Constitutional:      Appearance: She is well-developed.  HENT:     Head: Normocephalic and atraumatic.     Right Ear: Tympanic membrane, ear canal and external ear normal.     Left Ear: Tympanic membrane, ear canal and external ear normal.     Nose: Nose normal.     Mouth/Throat:     Pharynx: Uvula midline.  Eyes:     General: Lids are normal.        Right eye: No discharge.        Left eye: No discharge.     Extraocular Movements:     Right eye: No nystagmus.     Left eye: No nystagmus.     Conjunctiva/sclera: Conjunctivae normal.     Pupils: Pupils are equal, round, and reactive to light.  Cardiovascular:     Rate and Rhythm: Tachycardia present. Rhythm irregular.     Heart sounds: Normal heart sounds.  Pulmonary:     Effort: Pulmonary effort is normal.     Breath sounds: Normal breath sounds.  Abdominal:     Palpations: Abdomen is soft.     Tenderness: There is no abdominal tenderness. There is no guarding or rebound.  Musculoskeletal:        General: No swelling.     Cervical back: Normal range of motion and neck supple. No tenderness or bony tenderness.  Skin:    General: Skin is warm and dry.  Neurological:     Mental Status: She is alert. She is disoriented.     GCS: GCS eye subscore is 4. GCS verbal subscore is 5. GCS motor subscore is 6.     Cranial Nerves: No cranial nerve deficit.     Sensory: No sensory deficit.     Coordination: Coordination normal.     Deep Tendon Reflexes: Reflexes are normal and symmetric.  Psychiatric:        Attention and Perception: She perceives visual hallucinations.        Mood and Affect: Affect is labile.        Behavior: Behavior is agitated.     ED Results / Procedures / Treatments   Labs (all labs ordered are listed, but only abnormal results are displayed) Labs Reviewed  BASIC METABOLIC PANEL - Abnormal; Notable for the following components:      Result Value   Glucose, Bld 63 (*)    Creatinine, Ser 1.33 (*)     Calcium 10.5 (*)    GFR calc non Af Amer 39 (*)    GFR calc Af Amer 45 (*)    All other components within normal limits  URINALYSIS, ROUTINE W REFLEX MICROSCOPIC - Abnormal; Notable for the following components:   Color, Urine AMBER (*)    APPearance HAZY (*)    All other components within normal limits  CBC WITH DIFFERENTIAL/PLATELET - Abnormal; Notable for the following components:   RBC 3.66 (*)    MCV 104.4 (*)    MCH 34.7 (*)    All other components within normal limits  HEPATIC FUNCTION PANEL - Abnormal; Notable for the following components:   Total Protein 5.8 (*)  Albumin 2.8 (*)    Alkaline Phosphatase 266 (*)    Total Bilirubin 1.3 (*)    Bilirubin, Direct 0.6 (*)    All other components within normal limits  VALPROIC ACID LEVEL - Abnormal; Notable for the following components:   Valproic Acid Lvl 11 (*)    All other components within normal limits  CBG MONITORING, ED - Abnormal; Notable for the following components:   Glucose-Capillary 61 (*)    All other components within normal limits  CBG MONITORING, ED - Abnormal; Notable for the following components:   Glucose-Capillary 60 (*)    All other components within normal limits  CBG MONITORING, ED - Abnormal; Notable for the following components:   Glucose-Capillary 121 (*)    All other components within normal limits  CBG MONITORING, ED - Abnormal; Notable for the following components:   Glucose-Capillary 69 (*)    All other components within normal limits  CBG MONITORING, ED - Abnormal; Notable for the following components:   Glucose-Capillary 114 (*)    All other components within normal limits  CULTURE, BLOOD (ROUTINE X 2)  CULTURE, BLOOD (ROUTINE X 2)  LACTIC ACID, PLASMA  LACTIC ACID, PLASMA  PROTIME-INR  TSH  CBG MONITORING, ED  CBG MONITORING, ED  CBG MONITORING, ED    EKG EKG Interpretation  Date/Time:  Wednesday June 17 2019 15:27:41 EDT Ventricular Rate:  85 PR Interval:    QRS  Duration: 157 QT Interval:  398 QTC Calculation: 474 R Axis:   50 Text Interpretation: Atrial fibrillation Ventricular premature complex Right bundle branch block Confirmed by Quintella Reichert [phone]) on 06/17/2019 3:32:11 PM   Radiology CT Head Wo Contrast  Result Date: 06/17/2019 CLINICAL DATA:  Altered mental status EXAM: CT HEAD WITHOUT CONTRAST TECHNIQUE: Contiguous axial images were obtained from the base of the skull through the vertex without intravenous contrast. COMPARISON:  CT brain 04/22/2019 FINDINGS: Brain: No acute territorial infarction, hemorrhage or intracranial mass. Atrophy and chronic small vessel ischemic change of the white matter. Stable ventricle size. Vascular: No hyperdense vessels. Vertebral and carotid vascular calcification Skull: Normal. Negative for fracture or focal lesion. Sinuses/Orbits: No acute finding. Other: None IMPRESSION: 1. No CT evidence for acute intracranial abnormality. 2. Atrophy and chronic small vessel ischemic change of the white matter Electronically Signed   By: Donavan Foil M.D.   On: 06/17/2019 18:30   DG Chest Port 1 View  Result Date: 06/17/2019 CLINICAL DATA:  Altered level of consciousness EXAM: PORTABLE CHEST 1 VIEW COMPARISON:  04/22/2019 FINDINGS: Heart size is stable. Rounded density in the right hilar region measuring up to 1.8 cm. Chronic bibasilar interstitial prominence. No pleural effusion or pneumothorax. Chronic posttraumatic deformity of the proximal right humerus. IMPRESSION: Rounded density in the right hilar region measuring up to 1.8 cm. CT chest recommended for further evaluation. Electronically Signed   By: Davina Poke D.O.   On: 06/17/2019 15:40    Procedures Procedures (including critical care time)  Medications Ordered in ED Medications  dextrose 50 % solution (12.5 g  Given 06/17/19 1529)  oxyCODONE (Oxy IR/ROXICODONE) immediate release tablet 10 mg (10 mg Oral Given 06/17/19 1735)  sodium chloride 0.9 % bolus  500 mL (500 mLs Intravenous New Bag/Given 06/17/19 1916)    ED Course  I have reviewed the triage vital signs and the nursing notes.  Pertinent labs & imaging results that were available during my care of the patient were reviewed by me and considered in my medical decision  making (see chart for details).  Patient seen and examined. Work-up initiated.  Added hepatic function panel, thyroid testing, Depakote level, head CT.  Additional history obtained from nursing facility as well as patient's daughter.  Patient had borderline blood sugar in the low 60s on arrival, not improved with orange juice.  Once IV was established, half an ampule of D50 was given.  Will recheck blood sugar.  EKG reviewed.  Vital signs reviewed and are as follows: BP 109/68 (BP Location: Right Arm)   Pulse 95   Temp 98.3 F (36.8 C) (Oral)   Resp 20   Ht 5' 9" (1.753 m)   Wt 130 kg   SpO2 98%   BMI 42.32 kg/m   7:20 PM Blood sugar improved. Head CT reassuring. Patient drinking and receiving IV fluids. Patient discussed with and seen by Dr. Ralene Bathe. Plan for d/c if at baseline and UA is OK.   8:01 PM RN brought rhythm strip with concerns for nonsustained VT. rhythm strips reviewed by Dr. Ralene Bathe.  Suspect artifact.  8:32 PM Will give patient something to eat. Continuing to await urine test.   11:44 PM I spoke with the patient's daughter earlier.  Current plan is to discharge home after UA.  We discussed that at this point there have been no findings on work-up which would warrant hospitalization.  Patient seems to be back close to her baseline with extensive ED observation without any concerning symptoms.  Continuing to wait urine.  Daughter was okay with in and out cath.  This was ordered earlier.   Signout to [employer] at shift change.  She will follow up on urine.  She is aware of plan.    MDM Rules/Calculators/A&P                       Patient with complex underlying medical history presenting as above.   Main complaints voiced were increasing confusion over the past 2 days, decreased oral intake and appetite, lack of urination.  Extensive work-up performed today that was overall reassuring.  Patient's daughter is at bedside and patient is currently close to baseline.  Patient has had some borderline hypoglycemia which has been treated and is stable.  Her neurologic exam seems to be at baseline.  Head CT was negative.  No sources of infection identified.  Normal thyroid studies.  Subtherapeutic Depakote level.  Patient seems very well cared for at her facility.  She likely has an element of severe depression which is contributing.  At this point, benefits of hospitalization is probably very low.  Given her current appearance and reassuring work-up, feel that she would be best served by discharge and follow-up with her providers at facility.  Discussed plan with daughter.  Final Clinical Impression(s) / ED Diagnoses Final diagnoses:  Confusion  Hypoglycemia    Rx / DC Orders ED Discharge Orders    None       Carlisle Cater, Hershal Coria 06/18/19 1525    Quintella Reichert, MD 06/19/19 1039

## 2019-06-18 DIAGNOSIS — E11649 Type 2 diabetes mellitus with hypoglycemia without coma: Secondary | ICD-10-CM | POA: Diagnosis not present

## 2019-06-18 LAB — URINALYSIS, ROUTINE W REFLEX MICROSCOPIC
Bilirubin Urine: NEGATIVE
Glucose, UA: NEGATIVE mg/dL
Hgb urine dipstick: NEGATIVE
Ketones, ur: NEGATIVE mg/dL
Leukocytes,Ua: NEGATIVE
Nitrite: NEGATIVE
Protein, ur: NEGATIVE mg/dL
Specific Gravity, Urine: 1.015 (ref 1.005–1.030)
pH: 5 (ref 5.0–8.0)

## 2019-06-18 MED ORDER — OXYCODONE HCL 5 MG PO TABS: 10.0000 mg | ORAL_TABLET | Freq: Once | ORAL | Status: DC

## 2019-06-18 NOTE — ED Notes (Signed)
[facility] called for pt 

## 2019-06-18 NOTE — ED Notes (Signed)
Ashley Caldwell, daughter, [phone] would like an update when available

## 2019-06-22 LAB — CULTURE, BLOOD (ROUTINE X 2)
Culture: NO GROWTH
Culture: NO GROWTH
Special Requests: ADEQUATE
Special Requests: ADEQUATE

## 2019-07-13 ENCOUNTER — Emergency Department (HOSPITAL_COMMUNITY): Payer: Medicare HMO

## 2019-07-13 ENCOUNTER — Encounter (HOSPITAL_COMMUNITY): Payer: Self-pay | Admitting: Internal Medicine

## 2019-07-13 ENCOUNTER — Inpatient Hospital Stay (HOSPITAL_COMMUNITY)
Admission: EM | Admit: 2019-07-13 | Discharge: 2019-07-18 | DRG: 605 | Disposition: A | Discharge status: Skilled Nursing Facility | Payer: Medicare HMO | Attending: Internal Medicine | Admitting: Internal Medicine

## 2019-07-13 ENCOUNTER — Other Ambulatory Visit: Payer: Self-pay

## 2019-07-13 DIAGNOSIS — L89312 Pressure ulcer of right buttock, stage 2: Secondary | ICD-10-CM | POA: Diagnosis present

## 2019-07-13 DIAGNOSIS — I4891 Unspecified atrial fibrillation: Secondary | ICD-10-CM | POA: Diagnosis present

## 2019-07-13 DIAGNOSIS — Z66 Do not resuscitate: Secondary | ICD-10-CM | POA: Diagnosis present

## 2019-07-13 DIAGNOSIS — W228XXA Striking against or struck by other objects, initial encounter: Secondary | ICD-10-CM | POA: Diagnosis present

## 2019-07-13 DIAGNOSIS — Z833 Family history of diabetes mellitus: Secondary | ICD-10-CM

## 2019-07-13 DIAGNOSIS — Y92129 Unspecified place in nursing home as the place of occurrence of the external cause: Secondary | ICD-10-CM

## 2019-07-13 DIAGNOSIS — E1165 Type 2 diabetes mellitus with hyperglycemia: Secondary | ICD-10-CM | POA: Diagnosis present

## 2019-07-13 DIAGNOSIS — I129 Hypertensive chronic kidney disease with stage 1 through stage 4 chronic kidney disease, or unspecified chronic kidney disease: Secondary | ICD-10-CM | POA: Diagnosis present

## 2019-07-13 DIAGNOSIS — Z8249 Family history of ischemic heart disease and other diseases of the circulatory system: Secondary | ICD-10-CM

## 2019-07-13 DIAGNOSIS — J45909 Unspecified asthma, uncomplicated: Secondary | ICD-10-CM | POA: Diagnosis present

## 2019-07-13 DIAGNOSIS — Z79899 Other long term (current) drug therapy: Secondary | ICD-10-CM

## 2019-07-13 DIAGNOSIS — M79661 Pain in right lower leg: Secondary | ICD-10-CM | POA: Diagnosis present

## 2019-07-13 DIAGNOSIS — Z6841 Body Mass Index (BMI) 40.0 and over, adult: Secondary | ICD-10-CM

## 2019-07-13 DIAGNOSIS — R748 Abnormal levels of other serum enzymes: Secondary | ICD-10-CM | POA: Diagnosis present

## 2019-07-13 DIAGNOSIS — E1100 Type 2 diabetes mellitus with hyperosmolarity without nonketotic hyperglycemic-hyperosmolar coma (NKHHC): Secondary | ICD-10-CM

## 2019-07-13 DIAGNOSIS — I10 Essential (primary) hypertension: Secondary | ICD-10-CM | POA: Diagnosis present

## 2019-07-13 DIAGNOSIS — J449 Chronic obstructive pulmonary disease, unspecified: Secondary | ICD-10-CM | POA: Diagnosis present

## 2019-07-13 DIAGNOSIS — S8011XA Contusion of right lower leg, initial encounter: Secondary | ICD-10-CM | POA: Diagnosis present

## 2019-07-13 DIAGNOSIS — G894 Chronic pain syndrome: Secondary | ICD-10-CM | POA: Diagnosis present

## 2019-07-13 DIAGNOSIS — D62 Acute posthemorrhagic anemia: Secondary | ICD-10-CM | POA: Diagnosis not present

## 2019-07-13 DIAGNOSIS — D509 Iron deficiency anemia, unspecified: Secondary | ICD-10-CM | POA: Diagnosis present

## 2019-07-13 DIAGNOSIS — M797 Fibromyalgia: Secondary | ICD-10-CM | POA: Diagnosis present

## 2019-07-13 DIAGNOSIS — R0902 Hypoxemia: Secondary | ICD-10-CM

## 2019-07-13 DIAGNOSIS — L8990 Pressure ulcer of unspecified site, unspecified stage: Secondary | ICD-10-CM | POA: Insufficient documentation

## 2019-07-13 DIAGNOSIS — M170 Bilateral primary osteoarthritis of knee: Secondary | ICD-10-CM | POA: Diagnosis present

## 2019-07-13 DIAGNOSIS — R5382 Chronic fatigue, unspecified: Secondary | ICD-10-CM | POA: Diagnosis present

## 2019-07-13 DIAGNOSIS — Z888 Allergy status to other drugs, medicaments and biological substances status: Secondary | ICD-10-CM

## 2019-07-13 DIAGNOSIS — D649 Anemia, unspecified: Secondary | ICD-10-CM | POA: Diagnosis present

## 2019-07-13 DIAGNOSIS — L89322 Pressure ulcer of left buttock, stage 2: Secondary | ICD-10-CM | POA: Diagnosis present

## 2019-07-13 DIAGNOSIS — E7800 Pure hypercholesterolemia, unspecified: Secondary | ICD-10-CM | POA: Diagnosis present

## 2019-07-13 DIAGNOSIS — Z8616 Personal history of COVID-19: Secondary | ICD-10-CM

## 2019-07-13 DIAGNOSIS — E1122 Type 2 diabetes mellitus with diabetic chronic kidney disease: Secondary | ICD-10-CM | POA: Diagnosis present

## 2019-07-13 DIAGNOSIS — E063 Autoimmune thyroiditis: Secondary | ICD-10-CM | POA: Diagnosis present

## 2019-07-13 DIAGNOSIS — M1990 Unspecified osteoarthritis, unspecified site: Secondary | ICD-10-CM | POA: Diagnosis present

## 2019-07-13 DIAGNOSIS — D72829 Elevated white blood cell count, unspecified: Secondary | ICD-10-CM | POA: Diagnosis present

## 2019-07-13 DIAGNOSIS — Z20822 Contact with and (suspected) exposure to covid-19: Secondary | ICD-10-CM | POA: Diagnosis present

## 2019-07-13 DIAGNOSIS — Z7901 Long term (current) use of anticoagulants: Secondary | ICD-10-CM

## 2019-07-13 DIAGNOSIS — N179 Acute kidney failure, unspecified: Secondary | ICD-10-CM

## 2019-07-13 DIAGNOSIS — Z91040 Latex allergy status: Secondary | ICD-10-CM

## 2019-07-13 DIAGNOSIS — N1832 Chronic kidney disease, stage 3b: Secondary | ICD-10-CM | POA: Diagnosis present

## 2019-07-13 DIAGNOSIS — Z8619 Personal history of other infectious and parasitic diseases: Secondary | ICD-10-CM

## 2019-07-13 DIAGNOSIS — L4050 Arthropathic psoriasis, unspecified: Secondary | ICD-10-CM | POA: Diagnosis present

## 2019-07-13 DIAGNOSIS — F329 Major depressive disorder, single episode, unspecified: Secondary | ICD-10-CM | POA: Diagnosis present

## 2019-07-13 DIAGNOSIS — E119 Type 2 diabetes mellitus without complications: Secondary | ICD-10-CM

## 2019-07-13 DIAGNOSIS — Z880 Allergy status to penicillin: Secondary | ICD-10-CM

## 2019-07-13 DIAGNOSIS — F419 Anxiety disorder, unspecified: Secondary | ICD-10-CM | POA: Diagnosis present

## 2019-07-13 DIAGNOSIS — Z794 Long term (current) use of insulin: Secondary | ICD-10-CM

## 2019-07-13 DIAGNOSIS — Z90710 Acquired absence of both cervix and uterus: Secondary | ICD-10-CM

## 2019-07-13 DIAGNOSIS — Z87891 Personal history of nicotine dependence: Secondary | ICD-10-CM

## 2019-07-13 DIAGNOSIS — S8010XA Contusion of unspecified lower leg, initial encounter: Secondary | ICD-10-CM

## 2019-07-13 LAB — CBC WITH DIFFERENTIAL/PLATELET
Abs Immature Granulocytes: 0.09 10*3/uL — ABNORMAL HIGH (ref 0.00–0.07)
Basophils Absolute: 0 10*3/uL (ref 0.0–0.1)
Basophils Relative: 0 %
Eosinophils Absolute: 0 10*3/uL (ref 0.0–0.5)
Eosinophils Relative: 0 %
HCT: 35.9 % — ABNORMAL LOW (ref 36.0–46.0)
Hemoglobin: 11.4 g/dL — ABNORMAL LOW (ref 12.0–15.0)
Immature Granulocytes: 1 %
Lymphocytes Relative: 15 %
Lymphs Abs: 1.8 10*3/uL (ref 0.7–4.0)
MCH: 35 pg — ABNORMAL HIGH (ref 26.0–34.0)
MCHC: 31.8 g/dL (ref 30.0–36.0)
MCV: 110.1 fL — ABNORMAL HIGH (ref 80.0–100.0)
Monocytes Absolute: 1 10*3/uL (ref 0.1–1.0)
Monocytes Relative: 8 %
Neutro Abs: 8.8 10*3/uL — ABNORMAL HIGH (ref 1.7–7.7)
Neutrophils Relative %: 76 %
Platelets: 259 10*3/uL (ref 150–400)
RBC: 3.26 MIL/uL — ABNORMAL LOW (ref 3.87–5.11)
RDW: 20.2 % — ABNORMAL HIGH (ref 11.5–15.5)
WBC: 11.7 10*3/uL — ABNORMAL HIGH (ref 4.0–10.5)
nRBC: 0 % (ref 0.0–0.2)

## 2019-07-13 LAB — COMPREHENSIVE METABOLIC PANEL
ALT: 50 U/L — ABNORMAL HIGH (ref 0–44)
AST: 104 U/L — ABNORMAL HIGH (ref 15–41)
Albumin: 2.9 g/dL — ABNORMAL LOW (ref 3.5–5.0)
Alkaline Phosphatase: 337 U/L — ABNORMAL HIGH (ref 38–126)
Anion gap: 20 — ABNORMAL HIGH (ref 5–15)
BUN: 16 mg/dL (ref 8–23)
CO2: 23 mmol/L (ref 22–32)
Calcium: 8.9 mg/dL (ref 8.9–10.3)
Chloride: 99 mmol/L (ref 98–111)
Creatinine, Ser: 1.77 mg/dL — ABNORMAL HIGH (ref 0.44–1.00)
GFR calc Af Amer: 32 mL/min — ABNORMAL LOW (ref >60)
GFR calc non Af Amer: 27 mL/min — ABNORMAL LOW (ref >60)
Glucose, Bld: 119 mg/dL — ABNORMAL HIGH (ref 70–99)
Potassium: 3.6 mmol/L (ref 3.5–5.1)
Sodium: 142 mmol/L (ref 135–145)
Total Bilirubin: 1.7 mg/dL — ABNORMAL HIGH (ref 0.3–1.2)
Total Protein: 5.5 g/dL — ABNORMAL LOW (ref 6.5–8.1)

## 2019-07-13 LAB — CBG MONITORING, ED
Glucose-Capillary: 158 mg/dL — ABNORMAL HIGH (ref 70–99)
Glucose-Capillary: 91 mg/dL (ref 70–99)

## 2019-07-13 LAB — RESPIRATORY PANEL BY RT PCR (FLU A&B, COVID)
Influenza A by PCR: NEGATIVE
Influenza B by PCR: NEGATIVE
SARS Coronavirus 2 by RT PCR: NEGATIVE

## 2019-07-13 MED ORDER — OXYCODONE HCL 5 MG PO TABS
10.0000 mg | ORAL_TABLET | ORAL | Status: DC | PRN
  Administered 2019-07-14 – 2019-07-18 (×5): 10 mg via ORAL
  Filled 2019-07-13 (×6): qty 2

## 2019-07-13 MED ORDER — MORPHINE SULFATE (PF) 2 MG/ML IV SOLN
2.0000 mg | Freq: Once | INTRAVENOUS | Status: AC
  Administered 2019-07-13: 21:00:00 2 mg via INTRAVENOUS
  Filled 2019-07-13: qty 1

## 2019-07-13 MED ORDER — DIVALPROEX SODIUM 125 MG PO CSDR
125.0000 mg | DELAYED_RELEASE_CAPSULE | Freq: Two times a day (BID) | ORAL | Status: DC
  Administered 2019-07-14 – 2019-07-18 (×10): 125 mg via ORAL
  Filled 2019-07-13 (×13): qty 1

## 2019-07-13 MED ORDER — LISINOPRIL 10 MG PO TABS: 5.0000 mg | ORAL_TABLET | Freq: Every day | ORAL | Status: DC

## 2019-07-13 MED ORDER — TRAZODONE HCL 50 MG PO TABS
100.0000 mg | ORAL_TABLET | Freq: Every day | ORAL | Status: DC
  Administered 2019-07-14 – 2019-07-17 (×5): 100 mg via ORAL
  Filled 2019-07-13 (×5): qty 2

## 2019-07-13 MED ORDER — MONTELUKAST SODIUM 10 MG PO TABS
10.0000 mg | ORAL_TABLET | Freq: Every day | ORAL | Status: DC
  Administered 2019-07-15 – 2019-07-18 (×4): 10 mg via ORAL
  Filled 2019-07-13 (×5): qty 1

## 2019-07-13 MED ORDER — OXYCODONE HCL 5 MG PO TABS: 10.0000 mg | ORAL_TABLET | Freq: Once | ORAL | Status: DC

## 2019-07-13 MED ORDER — METOPROLOL TARTRATE 25 MG PO TABS
25.0000 mg | ORAL_TABLET | Freq: Two times a day (BID) | ORAL | Status: DC
  Administered 2019-07-14: 25 mg via ORAL
  Filled 2019-07-13 (×2): qty 1

## 2019-07-13 MED ORDER — ONDANSETRON HCL 4 MG/2ML IJ SOLN
4.0000 mg | Freq: Once | INTRAMUSCULAR | Status: AC
  Administered 2019-07-13: 22:00:00 4 mg via INTRAVENOUS
  Filled 2019-07-13: qty 2

## 2019-07-13 MED ORDER — ONDANSETRON HCL 4 MG PO TABS: 4.0000 mg | ORAL_TABLET | Freq: Four times a day (QID) | ORAL | Status: DC | PRN

## 2019-07-13 MED ORDER — ONDANSETRON HCL 4 MG/2ML IJ SOLN
4.0000 mg | Freq: Once | INTRAMUSCULAR | Status: AC
  Administered 2019-07-13: 4 mg via INTRAVENOUS
  Filled 2019-07-13: qty 2

## 2019-07-13 MED ORDER — DULOXETINE HCL 60 MG PO CPEP
120.0000 mg | ORAL_CAPSULE | Freq: Every day | ORAL | Status: DC
  Administered 2019-07-14 – 2019-07-18 (×5): 120 mg via ORAL
  Filled 2019-07-13 (×5): qty 2

## 2019-07-13 MED ORDER — FENTANYL 50 MCG/HR TD PT72: 1.0000 | MEDICATED_PATCH | TRANSDERMAL | Status: DC

## 2019-07-13 MED ORDER — FENTANYL CITRATE (PF) 100 MCG/2ML IJ SOLN
50.0000 ug | Freq: Once | INTRAMUSCULAR | Status: AC
  Administered 2019-07-13: 50 ug via INTRAVENOUS
  Filled 2019-07-13: qty 2

## 2019-07-13 MED ORDER — SODIUM CHLORIDE 0.9 % IV BOLUS
500.0000 mL | Freq: Once | INTRAVENOUS | Status: AC
  Administered 2019-07-13: 500 mL via INTRAVENOUS

## 2019-07-13 MED ORDER — MOMETASONE FURO-FORMOTEROL FUM 200-5 MCG/ACT IN AERO
2.0000 | INHALATION_SPRAY | Freq: Two times a day (BID) | RESPIRATORY_TRACT | Status: DC
  Administered 2019-07-14 – 2019-07-17 (×6): 2 via RESPIRATORY_TRACT
  Filled 2019-07-13: qty 8.8

## 2019-07-13 MED ORDER — ONDANSETRON HCL 4 MG/2ML IJ SOLN
4.0000 mg | Freq: Four times a day (QID) | INTRAMUSCULAR | Status: DC | PRN
  Administered 2019-07-14: 4 mg via INTRAVENOUS
  Filled 2019-07-13: qty 2

## 2019-07-13 MED ORDER — ROPINIROLE HCL 0.5 MG PO TABS
0.5000 mg | ORAL_TABLET | Freq: Every day | ORAL | Status: DC
  Administered 2019-07-14 – 2019-07-17 (×5): 0.5 mg via ORAL
  Filled 2019-07-13 (×5): qty 1

## 2019-07-13 MED ORDER — LEVOTHYROXINE SODIUM 100 MCG PO TABS
100.0000 ug | ORAL_TABLET | Freq: Every day | ORAL | Status: DC
  Administered 2019-07-14 – 2019-07-18 (×5): 100 ug via ORAL
  Filled 2019-07-13 (×5): qty 1

## 2019-07-13 MED ORDER — MORPHINE SULFATE (PF) 2 MG/ML IV SOLN
2.0000 mg | Freq: Once | INTRAVENOUS | Status: AC
  Administered 2019-07-13: 2 mg via INTRAVENOUS
  Filled 2019-07-13: qty 1

## 2019-07-13 MED ORDER — BACLOFEN 5 MG HALF TABLET
5.0000 mg | ORAL_TABLET | Freq: Three times a day (TID) | ORAL | Status: DC
  Administered 2019-07-14 – 2019-07-18 (×14): 5 mg via ORAL
  Filled 2019-07-13 (×17): qty 1

## 2019-07-13 MED ORDER — ALBUTEROL SULFATE (2.5 MG/3ML) 0.083% IN NEBU: 2.5000 mg | INHALATION_SOLUTION | RESPIRATORY_TRACT | Status: DC | PRN

## 2019-07-13 MED ORDER — INSULIN ASPART 100 UNIT/ML ~~LOC~~ SOLN
0.0000 [IU] | SUBCUTANEOUS | Status: DC
  Administered 2019-07-14: 2 [IU] via SUBCUTANEOUS
  Administered 2019-07-14: 3 [IU] via SUBCUTANEOUS
  Administered 2019-07-14 (×2): 2 [IU] via SUBCUTANEOUS
  Administered 2019-07-15: 7 [IU] via SUBCUTANEOUS
  Administered 2019-07-15: 2 [IU] via SUBCUTANEOUS
  Administered 2019-07-15: 5 [IU] via SUBCUTANEOUS
  Administered 2019-07-15: 2 [IU] via SUBCUTANEOUS
  Administered 2019-07-15 – 2019-07-16 (×3): 3 [IU] via SUBCUTANEOUS
  Administered 2019-07-16 (×3): 5 [IU] via SUBCUTANEOUS
  Administered 2019-07-17: 3 [IU] via SUBCUTANEOUS

## 2019-07-13 NOTE — ED Provider Notes (Signed)
[facility] EMERGENCY DEPARTMENT Provider Note   CSN: [id number] Arrival date & time: 07/13/19  1426     History Chief Complaint  Patient presents with   Leg Pain    Ashley Caldwell is a 79 y.o. female.  No history of fibromyalgia, A. fib (on Eliquis), diabetes, hypertension who is currently at [facility] who presents for evaluation of worsening right lower calf pain, swelling.  She reports that about a week ago, she hit her leg on the wheelchair.  She reports since then, it has been bruised, painful.  She reports that it started getting more swollen and more painful today.  She has difficulty walking and mostly uses a wheelchair so it has not affected her walking.  She is on blood thinners and states she has been taking them.  Denies any fevers, chest pain, difficulty breathing.  The history is provided by the patient.       Past Medical History:  Diagnosis Date   Arthritis    Asthma    CFS (chronic fatigue syndrome)    Compression fracture of vertebral column (HCC) 01/16/2011   per CXR   DDD (degenerative disc disease), lumbar 01/18/2016   Diabetes mellitus    Elevated cholesterol 01/18/2016   Fibromyalgia 01/18/2016   Fibromyositis    Hashimoto's thyroiditis    HTN (hypertension), benign    Hypoglycemia associated with diabetes ([place]) 01/16/2011   Morbid obesity ([place])    Osteoarthritis of both knees 01/18/2016   Pneumonia 12/2010   Psoriasis 01/18/2016   Psoriatic arthritis ([place])    Thyroid disease     Patient Active Problem List   Diagnosis Date Noted   Leg hematoma 07/13/2019   Elevated LFTs 04/23/2019   Atrial fibrillation ([place]) 04/23/2019   Chronic pain syndrome 04/23/2019   Opioid dependence ([place]) 04/23/2019   Constipation due to opioid therapy 04/23/2019   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   High risk medication use 06/12/2016   Closed fracture of right  proximal humerus 05/01/2016   History of renal failure 04/26/2016   Primary osteoarthritis of both knees 04/26/2016   Renal failure 04/13/2016   Acute renal failure ([place]) 04/13/2016   Psoriasis 01/18/2016   Fibromyalgia 01/18/2016   DDD (degenerative disc disease), lumbar 01/18/2016   Osteoarthritis of both knees 01/18/2016   Elevated cholesterol 01/18/2016   Hypoglycemia associated with diabetes ([place]) 01/16/2011   Compression fracture of vertebral column ([place]) 01/16/2011   Hyponatremia 01/14/2011   Pneumonia 01/13/2011   Hyperglycemia 01/13/2011   Weakness generalized 01/13/2011   DM type 2 (diabetes mellitus, type 2) ([place]) 01/13/2011   HTN (hypertension), benign 01/13/2011   Psoriatic arthritis ([place]) 01/13/2011   Anemia 01/13/2011   Thyroiditis, chronic 01/13/2011   Morbid obesity ([place]) 01/13/2011   Asthma 01/13/2011    Past Surgical History:  Procedure Laterality Date   ABDOMINAL HYSTERECTOMY     ABDOMINAL SURGERY     lapband   CATARACT EXTRACTION     KNEE SURGERY       OB History    Gravida      Para      Term      Preterm      AB      Living  2     SAB      TAB      Ectopic      Multiple      Live Births  Family History  Problem Relation Age of Onset   Diabetes Father    Hypertension Mother    Stroke Mother    Cancer Brother    Cancer Brother     Social History   Tobacco Use   Smoking status: Former Smoker    Years: 20.00    Types: Cigarettes    Quit date: 01/19/1991    Years since quitting: 28.4   Smokeless tobacco: Never Used  Substance Use Topics   Alcohol use: No   Drug use: No    Home Medications Prior to Admission medications   Medication Sig Start Date End Date Taking? Authorizing Provider  acetaminophen (TYLENOL) 325 MG tablet Take 650 mg by mouth every 6 (six) hours as needed for mild pain (temp >100.1).    Yes [provider]  albuterol (PROVENTIL  HFA;VENTOLIN HFA) 108 (90 BASE) MCG/ACT inhaler Inhale 2 puffs into the lungs every 4 (four) hours as needed for wheezing or shortness of breath. 01/16/11 04/22/28 Yes Rexene Alberts, MD  apixaban (ELIQUIS) 5 MG TABS tablet Take 1 tablet (5 mg total) by mouth 2 (two) times daily. Patient taking differently: Take 5 mg by mouth 2 (two) times daily. 8am and 8pm 04/27/19 09/16/19 Yes Ghimire, Dante Gang, MD  Baclofen 5 MG TABS Take 5 mg by mouth 3 (three) times daily. 8a, 2p, 8p   Yes [provider]  chlorhexidine (PERIDEX) 0.12 % solution 15 mLs by Mouth Rinse route every 12 (twelve) hours.    Yes [provider]  Cholecalciferol (VITAMIN D3) 50 MCG (2000 UT) TABS Take 2,000 Units by mouth daily at 2 PM.    Yes [provider]  Dextromethorphan-Benzocaine (CEPACOL SORE THROAT & COUGH) 5-7.5 MG LOZG Use as directed 1 lozenge in the mouth or throat every 2 (two) hours as needed (cough).    Yes [provider]  Dextromethorphan-guaiFENesin (ROBITUSSIN COUGH+CHEST CONG DM) 5-100 MG/5ML LIQD Take 10 mLs by mouth every 4 (four) hours as needed (cough).   Yes [provider]  divalproex (DEPAKOTE SPRINKLE) 125 MG capsule Take 125 mg by mouth 2 (two) times daily. 8a and 8p   Yes [provider]  DULoxetine (CYMBALTA) 60 MG capsule Take 1 capsule (60 mg total) by mouth 2 (two) times daily. Patient taking differently: Take 120 mg by mouth daily.  01/19/16 04/22/28 Yes Panwala, Naitik, PA-C  fentaNYL (DURAGESIC) 50 MCG/HR Place 1 patch onto the skin every 3 (three) days. 04/24/19  Yes Barb Merino, MD  Fluticasone-Salmeterol (ADVAIR) 500-50 MCG/DOSE AEPB Inhale 1 puff into the lungs 2 (two) times daily.   Yes [provider]  furosemide (LASIX) 40 MG tablet Take 40 mg by mouth daily at 2 PM.   Yes [provider]  Glucagon, rDNA, (GLUCAGON EMERGENCY) 1 MG KIT Inject 1 mg into the muscle every 6 (six) hours as needed (for blood suger <60).    Yes [provider]  insulin aspart (NOVOLOG) 100 UNIT/ML injection Before each meal 3 times a day, 140-199 - 2 units, 200-250 - 4 units, 251-299 - 6 units,  300-349 - 8 units,  350 or above 10 units. Dispense syringes and needles as needed, Ok to switch to PEN if approved. Substitute to any brand approved. DX DM2, Code E11.65 Patient taking differently: Inject 0-10 Units into the skin See admin instructions. Inject 0-10 units subcutaneously three times a day before meals: CBG < 60 or >350 notify MD; 140-199 = 2 units; 200-250 = 4 units; 939-030 =  6 units; 300-349 = 8 units; 350-399 = 10 units 04/16/16  Yes Thurnell Lose, MD  insulin detemir (LEVEMIR) 100 UNIT/ML injection Inject 10-25 Units into the skin See admin instructions. Inject 25 units subcutaneously daily at 8am and 10 units daily at 8pm   Yes [provider]  levothyroxine (SYNTHROID) 100 MCG tablet Take 1 tablet (100 mcg total) by mouth daily. Patient taking differently: Take 100 mcg by mouth daily at 6 (six) AM.  04/24/19  Yes Ghimire, Dante Gang, MD  lisinopril (PRINIVIL,ZESTRIL) 5 MG tablet Take 5 mg by mouth daily at 2 PM.    Yes [provider]  metFORMIN (GLUCOPHAGE) 1000 MG tablet Take 1,000 mg by mouth daily at 2 PM.   Yes [provider]  metoprolol tartrate (LOPRESSOR) 25 MG tablet Take 1 tablet (25 mg total) by mouth 2 (two) times daily. Patient taking differently: Take 25 mg by mouth 2 (two) times daily. 8am and 8pm 04/27/19 09/16/19 Yes Ghimire, Dante Gang, MD  montelukast (SINGULAIR) 10 MG tablet Take 10 mg by mouth daily at 2 PM.    Yes [provider]  Multiple Vitamin (MULTIVITAMIN WITH MINERALS) TABS tablet Take 1 tablet by mouth daily at 2 PM. Multi for Her 50 plus   Yes [provider]  Oxycodone HCl 10 MG TABS Take 10 mg by mouth See admin instructions. Take one tablet (10 mg) by mouth twice daily at 6 AM and 10 AM, may also take an additional 10 mg every four hours as needed for breakthrough  pain   Yes [provider]  Polyethyl Glycol-Propyl Glycol (SYSTANE) 0.4-0.3 % SOLN Place 1 drop into both eyes 4 (four) times daily as needed (dry eyes).   Yes [provider]  polyethylene glycol (MIRALAX / GLYCOLAX) 17 g packet Take 17 g by mouth every 3 (three) days.   Yes [provider]  rOPINIRole (REQUIP) 0.5 MG tablet Take 0.5 mg by mouth at bedtime.   Yes [provider]  senna (SENOKOT) 8.6 MG TABS tablet Take 1 tablet by mouth 2 (two) times daily. 8a and 8p   Yes [provider]  sodium chloride (OCEAN) 0.65 % SOLN nasal spray Place 1 spray into both nostrils 3 (three) times daily as needed for congestion.   Yes [provider]  traZODone (DESYREL) 100 MG tablet Take 100 mg by mouth at bedtime.   Yes [provider]  valACYclovir (VALTREX) 1000 MG tablet Take 1,000 mg by mouth daily.   Yes [provider]  etanercept (ENBREL) 50 MG/ML injection Inject 50 mg into the skin once a week. Give on Saturday!! Patient has not given injection in 2 weeks!!!  01/19/16  [provider]    Allergies    Lyrica [pregabalin], Amoxicillin-pot clavulanate, and Latex  Review of Systems   Review of Systems  Constitutional: Negative for fever.  Respiratory: Negative for shortness of breath.   Cardiovascular: Positive for leg swelling. Negative for chest pain.  Musculoskeletal:       Leg pain  Skin: Positive for color change (bruising) and wound.  All other systems reviewed and are negative.   Physical Exam Updated Vital Signs BP (!) 140/118    Pulse 88    Temp 98.3 F (36.8 C) (Oral)    Resp 13    Ht 5' 9"  (1.753 m)    Wt 129.7 kg    SpO2 98%    BMI 42.23 kg/m   Physical Exam Vitals and nursing note reviewed.  Constitutional:      Appearance: Normal appearance. She is well-developed.  HENT:     Head: Normocephalic and atraumatic.  Eyes:     General: Lids are normal.     Conjunctiva/sclera: Conjunctivae  normal.     Pupils: Pupils are equal, round, and reactive to light.  Cardiovascular:     Rate and Rhythm: Normal rate and regular rhythm.     Pulses:          Posterior tibial pulses are detected w/ Doppler on the right side and detected w/ Doppler on the left side.     Heart sounds: Normal heart sounds. No murmur. No friction rub. No gallop.   Pulmonary:     Effort: Pulmonary effort is normal.     Breath sounds: Normal breath sounds.  Abdominal:     Palpations: Abdomen is soft. Abdomen is not rigid.     Tenderness: There is no abdominal tenderness. There is no guarding.  Musculoskeletal:        General: Normal range of motion.     Cervical back: Full passive range of motion without pain.     Comments: Right lower extremity has a very large hematoma that begins on the posterior aspect of her proximal calf and extends both laterally and medially.  It extends about two thirds of the way of the calf.  There is a 4 inch laceration overlying this hematoma.  Skin:    General: Skin is warm and dry.     Capillary Refill: Capillary refill takes 2 to 3 seconds.     Comments: Slightly delayed cap refill.   Neurological:     Mental Status: She is alert and oriented to person, place, and time.  Psychiatric:        Speech: Speech normal.           ED Results / Procedures / Treatments   Labs (all labs ordered are listed, but only abnormal results are displayed) Labs Reviewed  COMPREHENSIVE METABOLIC PANEL - Abnormal; Notable for the following components:      Result Value   Glucose, Bld 119 (*)    Creatinine, Ser 1.77 (*)    Total Protein 5.5 (*)    Albumin 2.9 (*)    AST 104 (*)    ALT 50 (*)    Alkaline Phosphatase 337 (*)    Total Bilirubin 1.7 (*)    GFR calc non Af Amer 27 (*)    GFR calc Af Amer 32 (*)    Anion gap 20 (*)    All other components within normal limits  CBC WITH DIFFERENTIAL/PLATELET - Abnormal; Notable for the following components:   WBC 11.7 (*)    RBC  3.26 (*)    Hemoglobin 11.4 (*)    HCT 35.9 (*)    MCV 110.1 (*)    MCH 35.0 (*)    RDW 20.2 (*)    Neutro Abs 8.8 (*)    Abs Immature Granulocytes 0.09 (*)    All other components within normal limits  RESPIRATORY PANEL BY RT PCR (FLU A&B, COVID)  CBG MONITORING, ED    EKG None  Radiology CT Tibia Fibula Right Wo Contrast  Result Date: 07/13/2019 CLINICAL DATA:  Blunt trauma 1 week ago with known hematoma, active bleeding today EXAM: CT OF THE LOWER RIGHT EXTREMITY WITHOUT CONTRAST TECHNIQUE: Multidetector CT imaging of the right lower extremity was performed according to the standard protocol. COMPARISON:  None. FINDINGS: Bones/Joint/Cartilage Degenerative changes are noted in  the knee joint in all 3 compartments. No posttraumatic abnormality in the distal femur is seen. The tibia and fibula are well visualize without evidence of acute fracture or dislocation. Mild osteopenia is noted. The bony structures of the foot are within normal limits. No acute fracture is seen. Ligaments Suboptimally assessed by CT. Muscles and Tendons Relative muscular atrophy is noted with fatty infiltration identified. Soft tissues Some generalized edema is noted throughout the lower extremity on the right. There is a large mixed attenuation subcutaneous collection identified which measures approximately 18 x 3 cm in greatest transverse and AP dimensions. It extends for approximately 22 cm in craniocaudad projection and is consistent with a focal hematoma. This lies along the lateral aspect of the lower leg. Given its mixed attenuation it likely represents a combination of acute and chronic hematoma. A few small air foci are noted posteriorly likely representing skin breakdown. Correlate with the clinical exam. No other definitive hematoma is noted. No other soft tissue abnormality is seen. IMPRESSION: Large hematoma involving the lateral and posterior aspect of the right lower leg as described. This is consistent  with the given clinical history of recent trauma. A few small foci of air are noted posteriorly likely related to local breakdown of the skin. Mild associated edema is noted within the lower extremity. Degenerative changes of the knee joint. No acute bony abnormality is noted. Electronically Signed   By: Inez Catalina M.D.   On: 07/13/2019 21:08   CT Foot Right Wo Contrast  Result Date: 07/13/2019 CLINICAL DATA:  Blunt trauma 1 week ago with known hematoma, active bleeding today EXAM: CT OF THE LOWER RIGHT EXTREMITY WITHOUT CONTRAST TECHNIQUE: Multidetector CT imaging of the right lower extremity was performed according to the standard protocol. COMPARISON:  None. FINDINGS: Bones/Joint/Cartilage Degenerative changes are noted in the knee joint in all 3 compartments. No posttraumatic abnormality in the distal femur is seen. The tibia and fibula are well visualize without evidence of acute fracture or dislocation. Mild osteopenia is noted. The bony structures of the foot are within normal limits. No acute fracture is seen. Ligaments Suboptimally assessed by CT. Muscles and Tendons Relative muscular atrophy is noted with fatty infiltration identified. Soft tissues Some generalized edema is noted throughout the lower extremity on the right. There is a large mixed attenuation subcutaneous collection identified which measures approximately 18 x 3 cm in greatest transverse and AP dimensions. It extends for approximately 22 cm in craniocaudad projection and is consistent with a focal hematoma. This lies along the lateral aspect of the lower leg. Given its mixed attenuation it likely represents a combination of acute and chronic hematoma. A few small air foci are noted posteriorly likely representing skin breakdown. Correlate with the clinical exam. No other definitive hematoma is noted. No other soft tissue abnormality is seen. IMPRESSION: Large hematoma involving the lateral and posterior aspect of the right lower leg  as described. This is consistent with the given clinical history of recent trauma. A few small foci of air are noted posteriorly likely related to local breakdown of the skin. Mild associated edema is noted within the lower extremity. Degenerative changes of the knee joint. No acute bony abnormality is noted. Electronically Signed   By: Inez Catalina M.D.   On: 07/13/2019 21:08    Procedures Procedures (including critical care time)  Medications Ordered in ED Medications  morphine 2 MG/ML injection 2 mg (2 mg Intravenous Given 07/13/19 1925)  ondansetron (ZOFRAN) injection 4 mg (4 mg  Intravenous Given 07/13/19 1925)  morphine 2 MG/ML injection 2 mg (2 mg Intravenous Given 07/13/19 2053)  sodium chloride 0.9 % bolus 500 mL (0 mLs Intravenous Stopped 07/13/19 2231)  fentaNYL (SUBLIMAZE) injection 50 mcg (50 mcg Intravenous Given 07/13/19 2138)  ondansetron (ZOFRAN) injection 4 mg (4 mg Intravenous Given 07/13/19 2137)    ED Course  I have reviewed the triage vital signs and the nursing notes.  Pertinent labs & imaging results that were available during my care of the patient were reviewed by me and considered in my medical decision making (see chart for details).    MDM Rules/Calculators/A&P                      79 year old female who is on Eliquis from A. fib who presents for evaluation of right lower extremity pain, swelling.  She hit it on the back of her wheelchair about a week ago and since then has had pain, swelling.  It worsened today.  On initial ED arrival, she is afebrile, nontoxic-appearing.  Vital signs are stable.  On exam, her right lower extremity is significantly swollen with an obvious hematoma noted from the posterior calf and extends distally.  Difficulty obtaining palpable pulses but Dr. Tyrone Nine was able to locate a PT pulse via Doppler.  Plan to check labs, imaging.  Was called to the bedside by nurse.  They were attempting to transition patient from wheelchair to bed when the  leg spontaneously burst open and started bleeding.  On my evaluation, there was a large open wound overlying the hematoma that was actively bleeding.  Some of the hematoma had been evacuated from the site.  ABD pads were applied to the site.  CBC shows slight leukocytosis of 11.7.  Hemoglobin is 12.4.  Her baseline is around 12.7.  CMP shows BUN of 16, creatinine of 1.77.  Baseline creatinine is between 1.10-1.3. Her anion gap is 20. Will give small bolus of fluid.   CT scan shows a large fluid collection measuring approximately 18 x 3 cm and extends approximately 22 cm and is consistent with a focal hematoma.  Discussed patient with Dr. Alvan Dame (ortho) who recommends I&D for evacuation tomorrow. He requests medical admission. Patient should be NPO at midnight and hold eliquis.   Evaluation.  The 4 inch laceration/wound opening has increased to about 6-7 cm at this time.  ABD and Kerlix dressing was applied.  Discussed patient with Dr. Hal Hope (hospitalist) who accepts patient for admission.   Portions of this note were generated with [employer]. Dictation errors may occur despite best attempts at proofreading.   Final Clinical Impression(s) / ED Diagnoses Final diagnoses:  Hematoma of right lower extremity, initial encounter  Acute kidney injury [facility])    Rx / DC Orders ED Discharge Orders    None       Desma Mcgregor 07/13/19 [place], [place], DO 07/13/19 2243

## 2019-07-13 NOTE — Consult Note (Signed)
Reason for Consult: right calf subcutaneous hematoma  Referring Physician: Adela Lank, MD (ER)  Ashley Caldwell is an 79 y.o. female.  HPI: 79 yo female nursing home resident presents with painful right leg swelling.  Reported history of perhaps banging right leg against her wheelchair.  On Eliquis for a-fib. Complains of right leg pain  Past Medical History:  Diagnosis Date  . Arthritis   . Asthma   . CFS (chronic fatigue syndrome)   . Compression fracture of vertebral column (HCC) 01/16/2011   per CXR  . DDD (degenerative disc disease), lumbar 01/18/2016  . Diabetes mellitus   . Elevated cholesterol 01/18/2016  . Fibromyalgia 01/18/2016  . Fibromyositis   . Hashimoto's thyroiditis   . HTN (hypertension), benign   . Hypoglycemia associated with diabetes (HCC) 01/16/2011  . Morbid obesity (HCC)   . Osteoarthritis of both knees 01/18/2016  . Pneumonia 12/2010  . Psoriasis 01/18/2016  . Psoriatic arthritis (HCC)   . Thyroid disease     Past Surgical History:  Procedure Laterality Date  . ABDOMINAL HYSTERECTOMY    . ABDOMINAL SURGERY     lapband  . CATARACT EXTRACTION    . KNEE SURGERY      Family History  Problem Relation Age of Onset  . Diabetes Father   . Hypertension Mother   . Stroke Mother   . Cancer Brother   . Cancer Brother     Social History:  reports that she quit smoking about 28 years ago. Her smoking use included cigarettes. She quit after 20.00 years of use. She has never used smokeless tobacco. She reports that she does not drink alcohol or use drugs.  Allergies:  Allergies  Allergen Reactions  . Lyrica [Pregabalin] Shortness Of Breath  . Amoxicillin-Pot Clavulanate Nausea And Vomiting  . Latex Itching and Rash    Medications: I have reviewed the patient's current medications. Scheduled:  Results for orders placed or performed during the hospital encounter of 07/13/19 (from the past 24 hour(s))  CBG monitoring, ED     Status: None   Collection Time:  07/13/19  6:25 PM  Result Value Ref Range   Glucose-Capillary 91 70 - 99 mg/dL   Comment 1 Notify RN    Comment 2 Document in Chart   Comprehensive metabolic panel     Status: Abnormal   Collection Time: 07/13/19  7:07 PM  Result Value Ref Range   Sodium 142 135 - 145 mmol/L   Potassium 3.6 3.5 - 5.1 mmol/L   Chloride 99 98 - 111 mmol/L   CO2 23 22 - 32 mmol/L   Glucose, Bld 119 (H) 70 - 99 mg/dL   BUN 16 8 - 23 mg/dL   Creatinine, Ser 7.82 (H) 0.44 - 1.00 mg/dL   Calcium 8.9 8.9 - 95.6 mg/dL   Total Protein 5.5 (L) 6.5 - 8.1 g/dL   Albumin 2.9 (L) 3.5 - 5.0 g/dL   AST 213 (H) 15 - 41 U/L   ALT 50 (H) 0 - 44 U/L   Alkaline Phosphatase 337 (H) 38 - 126 U/L   Total Bilirubin 1.7 (H) 0.3 - 1.2 mg/dL   GFR calc non Af Amer 27 (L) >60 mL/min   GFR calc Af Amer 32 (L) >60 mL/min   Anion gap 20 (H) 5 - 15  CBC with Differential     Status: Abnormal   Collection Time: 07/13/19  7:07 PM  Result Value Ref Range   WBC 11.7 (H) 4.0 - 10.5 K/uL  RBC 3.26 (L) 3.87 - 5.11 MIL/uL   Hemoglobin 11.4 (L) 12.0 - 15.0 g/dL   HCT 35.9 (L) 36.0 - 46.0 %   MCV 110.1 (H) 80.0 - 100.0 fL   MCH 35.0 (H) 26.0 - 34.0 pg   MCHC 31.8 30.0 - 36.0 g/dL   RDW 20.2 (H) 11.5 - 15.5 %   Platelets 259 150 - 400 K/uL   nRBC 0.0 0.0 - 0.2 %   Neutrophils Relative % 76 %   Neutro Abs 8.8 (H) 1.7 - 7.7 K/uL   Lymphocytes Relative 15 %   Lymphs Abs 1.8 0.7 - 4.0 K/uL   Monocytes Relative 8 %   Monocytes Absolute 1.0 0.1 - 1.0 K/uL   Eosinophils Relative 0 %   Eosinophils Absolute 0.0 0.0 - 0.5 K/uL   Basophils Relative 0 %   Basophils Absolute 0.0 0.0 - 0.1 K/uL   Immature Granulocytes 1 %   Abs Immature Granulocytes 0.09 (H) 0.00 - 0.07 K/uL    X-ray: CLINICAL DATA:  Blunt trauma 1 week ago with known hematoma, active bleeding today  EXAM: CT OF THE LOWER RIGHT EXTREMITY WITHOUT CONTRAST  TECHNIQUE: Multidetector CT imaging of the right lower extremity was performed according to the standard  protocol.  COMPARISON:  None.  FINDINGS: Bones/Joint/Cartilage  Degenerative changes are noted in the knee joint in all 3 compartments. No posttraumatic abnormality in the distal femur is seen. The tibia and fibula are well visualize without evidence of acute fracture or dislocation. Mild osteopenia is noted. The bony structures of the foot are within normal limits. No acute fracture is seen.  Ligaments  Suboptimally assessed by CT.  Muscles and Tendons  Relative muscular atrophy is noted with fatty infiltration identified.  Soft tissues  Some generalized edema is noted throughout the lower extremity on the right. There is a large mixed attenuation subcutaneous collection identified which measures approximately 18 x 3 cm in greatest transverse and AP dimensions. It extends for approximately 22 cm in craniocaudad projection and is consistent with a focal hematoma. This lies along the lateral aspect of the lower leg. Given its mixed attenuation it likely represents a combination of acute and chronic hematoma. A few small air foci are noted posteriorly likely representing skin breakdown. Correlate with the clinical exam. No other definitive hematoma is noted. No other soft tissue abnormality is seen.  IMPRESSION: Large hematoma involving the lateral and posterior aspect of the right lower leg as described. This is consistent with the given clinical history of recent trauma. A few small foci of air are noted posteriorly likely related to local breakdown of the skin. Mild associated edema is noted within the lower extremity.  Degenerative changes of the knee joint. No acute bony abnormality is noted.   Electronically Signed   By: Inez Catalina M.D.  ROS  Multiple ER visits for DM, failure to thrive Otherwise as per HPI  Blood pressure (!) 140/118, pulse 88, temperature 98.3 F (36.8 C), temperature source Oral, resp. rate 13, height 5\' 9"  (1.753 m),  weight 129.7 kg, SpO2 98 %.  Physical Exam: Awake and alert this am when examined  Right lower leg - large superficial swelling, tense with unintentional partial decompression with laceration and bleeding from site Superficial skin laceration posteriorly related to excessive tension  Eyes: Anicteric no pallor. ENMT: No discharge from the ears eyes nose or mouth. Neck: No mass felt.  No neck rigidity. Respiratory: No rhonchi or crepitations. Cardiovascular: S1-S2 heard. Abdomen: Soft  nontender bowel sound present. Musculoskeletal: Right leg swelling and hematoma and ecchymotic areas. Skin: Hematoma and ecchymotic areas right leg with skin tear. Neurologic: Alert awake oriented to time place and person.  Moves all extremities. Psychiatric: Appears normal per normal affect  Assessment/Plan: Right leg hematoma  Plan: 1. Admit to medical service due to extensive medial co-morbidities 2. Likely to OR tomorrow pm for evacuation of right lower leg hematoma with hopeful primary wound closure 3. Hold Eliquis tonight 4.. Post-op orders will direct weight bearing restrictions and wound care recs  Shelda Pal 07/13/2019, 10:03 PM

## 2019-07-13 NOTE — H&P (View-Only) (Signed)
Reason for Consult: right calf subcutaneous hematoma  Referring Physician: Adela Lank, MD (ER)  Ashley Caldwell is an 79 y.o. female.  HPI: 79 yo female nursing home resident presents with painful right leg swelling.  Reported history of perhaps banging right leg against her wheelchair.  On Eliquis for a-fib. Complains of right leg pain  Past Medical History:  Diagnosis Date  . Arthritis   . Asthma   . CFS (chronic fatigue syndrome)   . Compression fracture of vertebral column (HCC) 01/16/2011   per CXR  . DDD (degenerative disc disease), lumbar 01/18/2016  . Diabetes mellitus   . Elevated cholesterol 01/18/2016  . Fibromyalgia 01/18/2016  . Fibromyositis   . Hashimoto's thyroiditis   . HTN (hypertension), benign   . Hypoglycemia associated with diabetes (HCC) 01/16/2011  . Morbid obesity (HCC)   . Osteoarthritis of both knees 01/18/2016  . Pneumonia 12/2010  . Psoriasis 01/18/2016  . Psoriatic arthritis (HCC)   . Thyroid disease     Past Surgical History:  Procedure Laterality Date  . ABDOMINAL HYSTERECTOMY    . ABDOMINAL SURGERY     lapband  . CATARACT EXTRACTION    . KNEE SURGERY      Family History  Problem Relation Age of Onset  . Diabetes Father   . Hypertension Mother   . Stroke Mother   . Cancer Brother   . Cancer Brother     Social History:  reports that she quit smoking about 28 years ago. Her smoking use included cigarettes. She quit after 20.00 years of use. She has never used smokeless tobacco. She reports that she does not drink alcohol or use drugs.  Allergies:  Allergies  Allergen Reactions  . Lyrica [Pregabalin] Shortness Of Breath  . Amoxicillin-Pot Clavulanate Nausea And Vomiting  . Latex Itching and Rash    Medications: I have reviewed the patient's current medications. Scheduled:  Results for orders placed or performed during the hospital encounter of 07/13/19 (from the past 24 hour(s))  CBG monitoring, ED     Status: None   Collection Time:  07/13/19  6:25 PM  Result Value Ref Range   Glucose-Capillary 91 70 - 99 mg/dL   Comment 1 Notify RN    Comment 2 Document in Chart   Comprehensive metabolic panel     Status: Abnormal   Collection Time: 07/13/19  7:07 PM  Result Value Ref Range   Sodium 142 135 - 145 mmol/L   Potassium 3.6 3.5 - 5.1 mmol/L   Chloride 99 98 - 111 mmol/L   CO2 23 22 - 32 mmol/L   Glucose, Bld 119 (H) 70 - 99 mg/dL   BUN 16 8 - 23 mg/dL   Creatinine, Ser 7.82 (H) 0.44 - 1.00 mg/dL   Calcium 8.9 8.9 - 95.6 mg/dL   Total Protein 5.5 (L) 6.5 - 8.1 g/dL   Albumin 2.9 (L) 3.5 - 5.0 g/dL   AST 213 (H) 15 - 41 U/L   ALT 50 (H) 0 - 44 U/L   Alkaline Phosphatase 337 (H) 38 - 126 U/L   Total Bilirubin 1.7 (H) 0.3 - 1.2 mg/dL   GFR calc non Af Amer 27 (L) >60 mL/min   GFR calc Af Amer 32 (L) >60 mL/min   Anion gap 20 (H) 5 - 15  CBC with Differential     Status: Abnormal   Collection Time: 07/13/19  7:07 PM  Result Value Ref Range   WBC 11.7 (H) 4.0 - 10.5 K/uL  RBC 3.26 (L) 3.87 - 5.11 MIL/uL   Hemoglobin 11.4 (L) 12.0 - 15.0 g/dL   HCT 35.9 (L) 36.0 - 46.0 %   MCV 110.1 (H) 80.0 - 100.0 fL   MCH 35.0 (H) 26.0 - 34.0 pg   MCHC 31.8 30.0 - 36.0 g/dL   RDW 20.2 (H) 11.5 - 15.5 %   Platelets 259 150 - 400 K/uL   nRBC 0.0 0.0 - 0.2 %   Neutrophils Relative % 76 %   Neutro Abs 8.8 (H) 1.7 - 7.7 K/uL   Lymphocytes Relative 15 %   Lymphs Abs 1.8 0.7 - 4.0 K/uL   Monocytes Relative 8 %   Monocytes Absolute 1.0 0.1 - 1.0 K/uL   Eosinophils Relative 0 %   Eosinophils Absolute 0.0 0.0 - 0.5 K/uL   Basophils Relative 0 %   Basophils Absolute 0.0 0.0 - 0.1 K/uL   Immature Granulocytes 1 %   Abs Immature Granulocytes 0.09 (H) 0.00 - 0.07 K/uL    X-ray: CLINICAL DATA:  Blunt trauma 1 week ago with known hematoma, active bleeding today  EXAM: CT OF THE LOWER RIGHT EXTREMITY WITHOUT CONTRAST  TECHNIQUE: Multidetector CT imaging of the right lower extremity was performed according to the standard  protocol.  COMPARISON:  None.  FINDINGS: Bones/Joint/Cartilage  Degenerative changes are noted in the knee joint in all 3 compartments. No posttraumatic abnormality in the distal femur is seen. The tibia and fibula are well visualize without evidence of acute fracture or dislocation. Mild osteopenia is noted. The bony structures of the foot are within normal limits. No acute fracture is seen.  Ligaments  Suboptimally assessed by CT.  Muscles and Tendons  Relative muscular atrophy is noted with fatty infiltration identified.  Soft tissues  Some generalized edema is noted throughout the lower extremity on the right. There is a large mixed attenuation subcutaneous collection identified which measures approximately 18 x 3 cm in greatest transverse and AP dimensions. It extends for approximately 22 cm in craniocaudad projection and is consistent with a focal hematoma. This lies along the lateral aspect of the lower leg. Given its mixed attenuation it likely represents a combination of acute and chronic hematoma. A few small air foci are noted posteriorly likely representing skin breakdown. Correlate with the clinical exam. No other definitive hematoma is noted. No other soft tissue abnormality is seen.  IMPRESSION: Large hematoma involving the lateral and posterior aspect of the right lower leg as described. This is consistent with the given clinical history of recent trauma. A few small foci of air are noted posteriorly likely related to local breakdown of the skin. Mild associated edema is noted within the lower extremity.  Degenerative changes of the knee joint. No acute bony abnormality is noted.   Electronically Signed   By: Inez Catalina M.D.  ROS  Multiple ER visits for DM, failure to thrive Otherwise as per HPI  Blood pressure (!) 140/118, pulse 88, temperature 98.3 F (36.8 C), temperature source Oral, resp. rate 13, height 5\' 9"  (1.753 m),  weight 129.7 kg, SpO2 98 %.  Physical Exam: Awake and alert this am when examined  Right lower leg - large superficial swelling, tense with unintentional partial decompression with laceration and bleeding from site Superficial skin laceration posteriorly related to excessive tension  Eyes: Anicteric no pallor. ENMT: No discharge from the ears eyes nose or mouth. Neck: No mass felt.  No neck rigidity. Respiratory: No rhonchi or crepitations. Cardiovascular: S1-S2 heard. Abdomen: Soft  nontender bowel sound present. Musculoskeletal: Right leg swelling and hematoma and ecchymotic areas. Skin: Hematoma and ecchymotic areas right leg with skin tear. Neurologic: Alert awake oriented to time place and person.  Moves all extremities. Psychiatric: Appears normal per normal affect  Assessment/Plan: Right leg hematoma  Plan: 1. Admit to medical service due to extensive medial co-morbidities 2. Likely to OR tomorrow pm for evacuation of right lower leg hematoma with hopeful primary wound closure 3. Hold Eliquis tonight 4.. Post-op orders will direct weight bearing restrictions and wound care recs  Nahia Nissan D Tracie Dore 07/13/2019, 10:03 PM      

## 2019-07-13 NOTE — H&P (Addendum)
History and Physical    Ashley Caldwell VEH:[id number] DOB: 03/10/1941 DOA: 07/13/2019  PCP: System, Pcp Not In  Patient coming from: [facility].  Chief Complaint: Right leg pain.  HPI: Ashley Caldwell is a 79 y.o. 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 fib presently on apixaban had hurt her leg after she fell onto her wheelchair about a week ago.  Since then she has developed a hematoma from which patient developed worsening swelling and her skin gave way and started having some blood oozing out of it and increasing pain was referred to the ER.  Denies any fever chills.  Denies any chest pain or shortness of breath.  ED Course: In the ER patient had a CAT scan which shows large hematoma of the right leg for which orthopedic surgeon Dr. Ihor Gully was consulted and plan to have clot removed in the morning.  Last dose of apixaban likely is this morning.  Labs show hemoglobin of 11.4 platelets 259 creatinine 1.7 increased from baseline of around 1.3 blood glucose 119 WBC 11.7 Covid test was negative.  Review of Systems: As per HPI, rest all negative.   Past Medical History:  Diagnosis Date  . Arthritis   . Asthma   . CFS (chronic fatigue syndrome)   . Compression fracture of vertebral column ([place]) 01/16/2011   per CXR  . DDD (degenerative disc disease), lumbar 01/18/2016  . Diabetes mellitus   . Elevated cholesterol 01/18/2016  . Fibromyalgia 01/18/2016  . Fibromyositis   . Hashimoto's thyroiditis   . HTN (hypertension), benign   . Hypoglycemia associated with diabetes ([place]) 01/16/2011  . Morbid obesity ([place])   . Osteoarthritis of both knees 01/18/2016  . Pneumonia 12/2010  . Psoriasis 01/18/2016  . Psoriatic arthritis ([place])   . Thyroid disease     Past Surgical History:  Procedure Laterality Date  . ABDOMINAL HYSTERECTOMY    . ABDOMINAL SURGERY     lapband  .  CATARACT EXTRACTION    . KNEE SURGERY       reports that she quit smoking about 28 years ago. Her smoking use included cigarettes. She quit after 20.00 years of use. She has never used smokeless tobacco. She reports that she does not drink alcohol or use drugs.  Allergies  Allergen Reactions  . Lyrica [Pregabalin] Shortness Of Breath  . Amoxicillin-Pot Clavulanate Nausea And Vomiting  . Latex Itching and Rash    Family History  Problem Relation Age of Onset  . Diabetes Father   . Hypertension Mother   . Stroke Mother   . Cancer Brother   . Cancer Brother     Prior to Admission medications   Medication Sig Start Date End Date Taking? Authorizing Provider  acetaminophen (TYLENOL) 325 MG tablet Take 650 mg by mouth every 6 (six) hours as needed for mild pain (temp >100.1).    Yes [provider]  albuterol (PROVENTIL HFA;VENTOLIN HFA) 108 (90 BASE) MCG/ACT inhaler Inhale 2 puffs into the lungs every 4 (four) hours as needed for wheezing or shortness of breath. 01/16/11 04/22/28 Yes Rexene Alberts, MD  apixaban (ELIQUIS) 5 MG TABS tablet Take 1 tablet (5 mg total) by mouth 2 (two) times daily. Patient taking differently: Take 5 mg by mouth 2 (two) times daily. 8am and 8pm 04/27/19 09/16/19 Yes Barb Merino, MD  Baclofen 5 MG  TABS Take 5 mg by mouth 3 (three) times daily. 8a, 2p, 8p   Yes [provider]  chlorhexidine (PERIDEX) 0.12 % solution 15 mLs by Mouth Rinse route every 12 (twelve) hours.    Yes [provider]  Cholecalciferol (VITAMIN D3) 50 MCG (2000 UT) TABS Take 2,000 Units by mouth daily at 2 PM.    Yes [provider]  Dextromethorphan-Benzocaine (CEPACOL SORE THROAT & COUGH) 5-7.5 MG LOZG Use as directed 1 lozenge in the mouth or throat every 2 (two) hours as needed (cough).    Yes [provider]  Dextromethorphan-guaiFENesin (ROBITUSSIN COUGH+CHEST CONG DM) 5-100 MG/5ML LIQD Take 10 mLs by mouth every 4 (four) hours as needed  (cough).   Yes [provider]  divalproex (DEPAKOTE SPRINKLE) 125 MG capsule Take 125 mg by mouth 2 (two) times daily. 8a and 8p   Yes [provider]  DULoxetine (CYMBALTA) 60 MG capsule Take 1 capsule (60 mg total) by mouth 2 (two) times daily. Patient taking differently: Take 120 mg by mouth daily.  01/19/16 04/22/28 Yes Panwala, Naitik, PA-C  fentaNYL (DURAGESIC) 50 MCG/HR Place 1 patch onto the skin every 3 (three) days. 04/24/19  Yes Barb Merino, MD  Fluticasone-Salmeterol (ADVAIR) 500-50 MCG/DOSE AEPB Inhale 1 puff into the lungs 2 (two) times daily.   Yes [provider]  furosemide (LASIX) 40 MG tablet Take 40 mg by mouth daily at 2 PM.   Yes [provider]  Glucagon, rDNA, (GLUCAGON EMERGENCY) 1 MG KIT Inject 1 mg into the muscle every 6 (six) hours as needed (for blood suger <60).    Yes [provider]  insulin aspart (NOVOLOG) 100 UNIT/ML injection Before each meal 3 times a day, 140-199 - 2 units, 200-250 - 4 units, 251-299 - 6 units,  300-349 - 8 units,  350 or above 10 units. Dispense syringes and needles as needed, Ok to switch to PEN if approved. Substitute to any brand approved. DX DM2, Code E11.65 Patient taking differently: Inject 0-10 Units into the skin See admin instructions. Inject 0-10 units subcutaneously three times a day before meals: CBG < 60 or >350 notify MD; 140-199 = 2 units; 200-250 = 4 units; 251-299 = 6 units; 300-349 = 8 units; 433-295 = 10 units 04/16/16  Yes Thurnell Lose, MD  insulin detemir (LEVEMIR) 100 UNIT/ML injection Inject 10-25 Units into the skin See admin instructions. Inject 25 units subcutaneously daily at 8am and 10 units daily at 8pm   Yes [provider]  levothyroxine (SYNTHROID) 100 MCG tablet Take 1 tablet (100 mcg total) by mouth daily. Patient taking differently: Take 100 mcg by mouth daily at 6 (six) AM.  04/24/19  Yes Ghimire, Dante Gang, MD  lisinopril (PRINIVIL,ZESTRIL) 5 MG tablet Take 5  mg by mouth daily at 2 PM.    Yes [provider]  metFORMIN (GLUCOPHAGE) 1000 MG tablet Take 1,000 mg by mouth daily at 2 PM.   Yes [provider]  metoprolol tartrate (LOPRESSOR) 25 MG tablet Take 1 tablet (25 mg total) by mouth 2 (two) times daily. Patient taking differently: Take 25 mg by mouth 2 (two) times daily. 8am and 8pm 04/27/19 09/16/19 Yes Ghimire, Dante Gang, MD  montelukast (SINGULAIR) 10 MG tablet Take 10 mg by mouth daily at 2 PM.    Yes [provider]  Multiple Vitamin (MULTIVITAMIN WITH MINERALS) TABS tablet Take 1 tablet by mouth daily at 2 PM. Multi for Her 50 plus   Yes [provider]  Oxycodone HCl 10 MG TABS Take 10 mg by mouth See admin instructions. Take one tablet (10 mg) by mouth twice daily at 6 AM and 10 AM, may also take an additional 10 mg every four hours as needed for breakthrough pain   Yes [provider]  Polyethyl Glycol-Propyl Glycol (SYSTANE) 0.4-0.3 % SOLN Place 1 drop into both eyes 4 (four) times daily as needed (dry eyes).   Yes [provider]  polyethylene glycol (MIRALAX / GLYCOLAX) 17 g packet Take 17 g by mouth every 3 (three) days.   Yes [provider]  rOPINIRole (REQUIP) 0.5 MG tablet Take 0.5 mg by mouth at bedtime.   Yes [provider]  senna (SENOKOT) 8.6 MG TABS tablet Take 1 tablet by mouth 2 (two) times daily. 8a and 8p   Yes [provider]  sodium chloride (OCEAN) 0.65 % SOLN nasal spray Place 1 spray into both nostrils 3 (three) times daily as needed for congestion.   Yes [provider]  traZODone (DESYREL) 100 MG tablet Take 100 mg by mouth at bedtime.   Yes [provider]  valACYclovir (VALTREX) 1000 MG tablet Take 1,000 mg by mouth daily.   Yes [provider]  etanercept (ENBREL) 50 MG/ML injection Inject 50 mg into the skin once a week. Give on Saturday!! Patient has not given injection in 2 weeks!!!  01/19/16  [provider]    Physical Exam: Constitutional: Moderately built and nourished. Vitals:   07/13/19 1925 07/13/19 2136 07/13/19 2200 07/13/19 2245  BP: (!) 214/181 (!) 140/118  (!) 129/104  Pulse: 70 88 85 67  Resp: 18 13  17   Temp:      TempSrc:      SpO2: 98% 98%  98%  Weight:      Height:       Eyes: Anicteric no pallor. ENMT: No discharge from the ears eyes nose or mouth. Neck: No mass felt.  No neck rigidity. Respiratory: No rhonchi or crepitations. Cardiovascular: S1-S2 heard. Abdomen: Soft nontender bowel sound present. Musculoskeletal: Right leg swelling and hematoma and ecchymotic areas. Skin: Hematoma and ecchymotic areas right leg with skin tear. Neurologic: Alert awake oriented to time place and person.  Moves all extremities. Psychiatric: Appears normal per normal affect.   Labs on Admission: I have personally reviewed following labs and imaging studies  CBC: Recent Labs  Lab 07/13/19 1907  WBC 11.7*  NEUTROABS 8.8*  HGB 11.4*  HCT 35.9*  MCV 110.1*  PLT 161   Basic Metabolic Panel: Recent Labs  Lab 07/13/19 1907  NA 142  K 3.6  CL 99  CO2 23  GLUCOSE 119*  BUN 16  CREATININE 1.77*  CALCIUM 8.9   GFR: Estimated Creatinine Clearance: 39.1 mL/min (A) (by C-G formula based on SCr of 1.77 mg/dL (H)). Liver Function Tests: Recent Labs  Lab 07/13/19 1907  AST 104*  ALT 50*  ALKPHOS 337*  BILITOT 1.7*  PROT 5.5*  ALBUMIN 2.9*   No results for input(s): LIPASE, AMYLASE in the last 168 hours. No results for input(s): AMMONIA in the last 168 hours. Coagulation Profile: No results for input(s): INR, PROTIME in the last 168 hours. Cardiac Enzymes: No results for input(s): CKTOTAL, CKMB, CKMBINDEX, TROPONINI in the last 168 hours. BNP (last 3 results) No results for input(s): PROBNP in the last 8760 hours. HbA1C: No results for input(s): HGBA1C in the last 72 hours. CBG: Recent Labs  Lab 07/13/19 1825  GLUCAP 91   Lipid Profile: No results for  input(s): CHOL, HDL, LDLCALC, TRIG, CHOLHDL, LDLDIRECT in the last 72 hours. Thyroid Function Tests: No results for input(s): TSH, T4TOTAL, FREET4, T3FREE, THYROIDAB in the last 72 hours. Anemia Panel: No results for input(s): VITAMINB12, FOLATE, FERRITIN, TIBC, IRON, RETICCTPCT in the last 72 hours. Urine analysis:    Component Value Date/Time   COLORURINE AMBER (A) 06/18/2019 0102   APPEARANCEUR HAZY (A) 06/18/2019 0102   LABSPEC 1.015 06/18/2019 0102   PHURINE 5.0 06/18/2019 0102   GLUCOSEU NEGATIVE 06/18/2019 0102   HGBUR NEGATIVE 06/18/2019 0102   BILIRUBINUR NEGATIVE 06/18/2019 0102   KETONESUR NEGATIVE 06/18/2019 0102   PROTEINUR NEGATIVE 06/18/2019 0102   UROBILINOGEN 0.2 01/13/2011 1819   NITRITE NEGATIVE 06/18/2019 0102   LEUKOCYTESUR NEGATIVE 06/18/2019 0102   Sepsis Labs: '@LABRCNTIP'$ (procalcitonin:4,lacticidven:4) ) Recent Results (from the past 240 hour(s))  Respiratory Panel by RT PCR (Flu A&B, Covid) - Nasopharyngeal Swab     Status: None   Collection Time: 07/13/19 10:11 PM   Specimen: Nasopharyngeal Swab  Result Value Ref Range Status   SARS Coronavirus 2 by RT PCR NEGATIVE NEGATIVE Final    Comment: (NOTE) SARS-CoV-2 target nucleic acids are NOT DETECTED. The SARS-CoV-2 RNA is generally detectable in upper respiratoy specimens during the acute phase of infection. The lowest concentration of SARS-CoV-2 viral copies this assay can detect is 131 copies/mL. A negative result does not preclude SARS-Cov-2 infection and should not be used as the sole basis for treatment or other patient management decisions. A negative result may occur with  improper specimen collection/handling, submission of specimen other than nasopharyngeal swab, presence of viral mutation(s) within the areas targeted by this assay, and inadequate number of viral copies (<131 copies/mL). A negative result must be combined with clinical observations, patient history, and epidemiological  information. The expected result is Negative. Fact Sheet for Patients:  PinkCheek.be Fact Sheet for Healthcare Providers:  GravelBags.it This test is not yet ap proved or cleared by the [place] FDA and  has been authorized for detection and/or diagnosis of SARS-CoV-2 by FDA under an Emergency Use Authorization (EUA). This EUA will remain  in effect (meaning this test can be used) for the duration of the COVID-19 declaration under Section 564(b)(1) of the Act, 21 U.S.C. section 360bbb-3(b)(1), unless the authorization is terminated or revoked sooner.    Influenza A by PCR NEGATIVE NEGATIVE Final   Influenza B by PCR NEGATIVE NEGATIVE Final    Comment: (NOTE) The Xpert Xpress SARS-CoV-2/FLU/RSV assay is intended as an aid in  the diagnosis of influenza from Nasopharyngeal swab specimens and  should not be used as a sole basis for treatment. Nasal washings and  aspirates are unacceptable for Xpert Xpress SARS-CoV-2/FLU/RSV  testing. Fact Sheet for Patients: PinkCheek.be Fact Sheet for Healthcare Providers: GravelBags.it This test is not yet approved or cleared by the [place] FDA and  has been authorized for detection and/or diagnosis of SARS-CoV-2 by  FDA under an Emergency Use Authorization (EUA). This EUA will remain  in effect (meaning this test can be used) for the duration of the  Covid-19 declaration under Section 564(b)(1) of the Act, 21  U.S.C. section 360bbb-3(b)(1), unless the authorization is  terminated or revoked. Performed at [facility], [place] [address] [place], [place] [address]      Radiological Exams on Admission: CT Tibia Fibula Right Wo Contrast  Result Date: 07/13/2019 CLINICAL DATA:  Blunt trauma 1 week ago with known hematoma,  active bleeding today EXAM: CT OF THE LOWER RIGHT EXTREMITY WITHOUT CONTRAST TECHNIQUE:  Multidetector CT imaging of the right lower extremity was performed according to the standard protocol. COMPARISON:  None. FINDINGS: Bones/Joint/Cartilage Degenerative changes are noted in the knee joint in all 3 compartments. No posttraumatic abnormality in the distal femur is seen. The tibia and fibula are well visualize without evidence of acute fracture or dislocation. Mild osteopenia is noted. The bony structures of the foot are within normal limits. No acute fracture is seen. Ligaments Suboptimally assessed by CT. Muscles and Tendons Relative muscular atrophy is noted with fatty infiltration identified. Soft tissues Some generalized edema is noted throughout the lower extremity on the right. There is a large mixed attenuation subcutaneous collection identified which measures approximately 18 x 3 cm in greatest transverse and AP dimensions. It extends for approximately 22 cm in craniocaudad projection and is consistent with a focal hematoma. This lies along the lateral aspect of the lower leg. Given its mixed attenuation it likely represents a combination of acute and chronic hematoma. A few small air foci are noted posteriorly likely representing skin breakdown. Correlate with the clinical exam. No other definitive hematoma is noted. No other soft tissue abnormality is seen. IMPRESSION: Large hematoma involving the lateral and posterior aspect of the right lower leg as described. This is consistent with the given clinical history of recent trauma. A few small foci of air are noted posteriorly likely related to local breakdown of the skin. Mild associated edema is noted within the lower extremity. Degenerative changes of the knee joint. No acute bony abnormality is noted. Electronically Signed   By: Inez Catalina M.D.   On: 07/13/2019 21:08   CT Foot Right Wo Contrast  Result Date: 07/13/2019 CLINICAL DATA:  Blunt trauma 1 week ago with known hematoma, active bleeding today EXAM: CT OF THE LOWER RIGHT  EXTREMITY WITHOUT CONTRAST TECHNIQUE: Multidetector CT imaging of the right lower extremity was performed according to the standard protocol. COMPARISON:  None. FINDINGS: Bones/Joint/Cartilage Degenerative changes are noted in the knee joint in all 3 compartments. No posttraumatic abnormality in the distal femur is seen. The tibia and fibula are well visualize without evidence of acute fracture or dislocation. Mild osteopenia is noted. The bony structures of the foot are within normal limits. No acute fracture is seen. Ligaments Suboptimally assessed by CT. Muscles and Tendons Relative muscular atrophy is noted with fatty infiltration identified. Soft tissues Some generalized edema is noted throughout the lower extremity on the right. There is a large mixed attenuation subcutaneous collection identified which measures approximately 18 x 3 cm in greatest transverse and AP dimensions. It extends for approximately 22 cm in craniocaudad projection and is consistent with a focal hematoma. This lies along the lateral aspect of the lower leg. Given its mixed attenuation it likely represents a combination of acute and chronic hematoma. A few small air foci are noted posteriorly likely representing skin breakdown. Correlate with the clinical exam. No other definitive hematoma is noted. No other soft tissue abnormality is seen. IMPRESSION: Large hematoma involving the lateral and posterior aspect of the right lower leg as described. This is consistent with the given clinical history of recent trauma. A few small foci of air are noted posteriorly likely related to local breakdown of the skin. Mild associated edema is noted within the lower extremity. Degenerative changes of the knee joint. No acute bony abnormality is noted. Electronically Signed   By: Inez Catalina M.D.   On:  07/13/2019 21:08     Assessment/Plan Principal Problem:   Leg hematoma Active Problems:   DM type 2 (diabetes mellitus, type 2) (HCC)   HTN  (hypertension), benign   Psoriatic arthritis ([place])   Anemia   Asthma    1. Right leg hematoma in the setting of anticoagulation with apixaban -after discussion with the patient apixaban is on hold and Dr. Ihor Gully orthopedic surgeon has been consulted and plan is to have clot removed in the morning.  We will keep patient n.p.o. past midnight. 2. A. fib presently rate controlled apixaban on hold due to large right leg hematoma after fall.  On beta-blockers for rate control. 3. Diabetes mellitus type 2 on sliding scale coverage for now resume long-acting insulin after patient starts diet. 4. Hypertension on lisinopril and beta-blocker.  Holding lisinopril due to worsening renal function.  As needed IV hydralazine. 5. Anemia appears to be chronic but follow CBC since patient has hematoma with bleeding in the right leg. 6. Acute on chronic kidney disease stage III creatinine mildly worsened from baseline likely from bleeding.  Holding lisinopril.  Follow metabolic panel. 7. Chronic pain on fentanyl patch baclofen. 8. Recent Covid infection. 9. Recent herpes ophthalmicus of the right eye. 10. Hypothyroidism on Synthroid. 11. Depression on Depakote and Cymbalta.  Depakote levels pending.  Since patient has large hematoma in the setting of apixaban use will need close monitoring and also going for surgery will need more than 2 midnight stay in inpatient status.   DVT prophylaxis: Will need to start anticoagulation after surgery according to orthopedics. Code Status: DNR. Family Communication: Discussed with patient. Disposition Plan: Back to facility when stable. Consults called: Orthopedics. Admission status: Inpatient.   Rise Patience MD [facility] Pager [phone].  If 7PM-7AM, please contact night-coverage [email or web address] Password [facility]  07/13/2019, 11:48 PM

## 2019-07-13 NOTE — ED Triage Notes (Signed)
One hour ago pt having severe R calf pain after standing up from sitting position. Significant bruise from injury one week ago where she hit her leg on the wheelchair. Pt screaming in triage. Arrives with paperwork from [facility] that sts she has multitude of screaming outbursts and anxiety episodes. Fentanyl patch to site.

## 2019-07-14 ENCOUNTER — Encounter (HOSPITAL_COMMUNITY)
Admission: EM | Disposition: A | Discharge status: Skilled Nursing Facility | Payer: Self-pay | Source: Home / Self Care | Attending: Internal Medicine

## 2019-07-14 ENCOUNTER — Inpatient Hospital Stay (HOSPITAL_COMMUNITY): Payer: Medicare HMO | Admitting: Anesthesiology

## 2019-07-14 ENCOUNTER — Encounter (HOSPITAL_COMMUNITY): Payer: Self-pay | Admitting: Internal Medicine

## 2019-07-14 HISTORY — PX: HEMATOMA EVACUATION: SHX5118

## 2019-07-14 LAB — CBG MONITORING, ED
Glucose-Capillary: 158 mg/dL — ABNORMAL HIGH (ref 70–99)
Glucose-Capillary: 152 mg/dL — ABNORMAL HIGH (ref 70–99)
Glucose-Capillary: 103 mg/dL — ABNORMAL HIGH (ref 70–99)

## 2019-07-14 LAB — VALPROIC ACID LEVEL: Valproic Acid Lvl: 16 ug/mL — ABNORMAL LOW (ref 50.0–100.0)

## 2019-07-14 LAB — CBC
HCT: 28.9 % — ABNORMAL LOW (ref 36.0–46.0)
Hemoglobin: 9.2 g/dL — ABNORMAL LOW (ref 12.0–15.0)
MCH: 35.7 pg — ABNORMAL HIGH (ref 26.0–34.0)
MCHC: 31.8 g/dL (ref 30.0–36.0)
MCV: 112 fL — ABNORMAL HIGH (ref 80.0–100.0)
Platelets: 228 10*3/uL (ref 150–400)
RBC: 2.58 MIL/uL — ABNORMAL LOW (ref 3.87–5.11)
RDW: 19.9 % — ABNORMAL HIGH (ref 11.5–15.5)
WBC: 14.7 10*3/uL — ABNORMAL HIGH (ref 4.0–10.5)
nRBC: 0 % (ref 0.0–0.2)

## 2019-07-14 LAB — BASIC METABOLIC PANEL
Anion gap: 12 (ref 5–15)
BUN: 22 mg/dL (ref 8–23)
CO2: 26 mmol/L (ref 22–32)
Calcium: 8.2 mg/dL — ABNORMAL LOW (ref 8.9–10.3)
Chloride: 99 mmol/L (ref 98–111)
Creatinine, Ser: 1.47 mg/dL — ABNORMAL HIGH (ref 0.44–1.00)
GFR calc Af Amer: 40 mL/min — ABNORMAL LOW (ref >60)
GFR calc non Af Amer: 34 mL/min — ABNORMAL LOW (ref >60)
Glucose, Bld: 175 mg/dL — ABNORMAL HIGH (ref 70–99)
Potassium: 4.2 mmol/L (ref 3.5–5.1)
Sodium: 137 mmol/L (ref 135–145)

## 2019-07-14 LAB — MRSA PCR SCREENING: MRSA by PCR: POSITIVE — AB

## 2019-07-14 LAB — GLUCOSE, CAPILLARY
Glucose-Capillary: 101 mg/dL — ABNORMAL HIGH (ref 70–99)
Glucose-Capillary: 174 mg/dL — ABNORMAL HIGH (ref 70–99)
Glucose-Capillary: 201 mg/dL — ABNORMAL HIGH (ref 70–99)
Glucose-Capillary: 74 mg/dL (ref 70–99)
Glucose-Capillary: 91 mg/dL (ref 70–99)

## 2019-07-14 SURGERY — EVACUATION HEMATOMA
Anesthesia: General | Site: Leg Upper | Laterality: Right

## 2019-07-14 MED ORDER — ONDANSETRON HCL 4 MG/2ML IJ SOLN
INTRAMUSCULAR | Status: DC | PRN
  Administered 2019-07-14: 4 mg via INTRAVENOUS

## 2019-07-14 MED ORDER — ALUM & MAG HYDROXIDE-SIMETH 200-200-20 MG/5ML PO SUSP: 15.0000 mL | ORAL | Status: DC | PRN

## 2019-07-14 MED ORDER — LACTATED RINGERS IV SOLN: INTRAVENOUS | Status: DC

## 2019-07-14 MED ORDER — EPHEDRINE SULFATE 50 MG/ML IJ SOLN
INTRAMUSCULAR | Status: DC | PRN
Start: 2019-07-14 — End: 2019-07-14
  Administered 2019-07-14: 10 mg via INTRAVENOUS

## 2019-07-14 MED ORDER — DEXAMETHASONE SODIUM PHOSPHATE 10 MG/ML IJ SOLN
10.0000 mg | Freq: Once | INTRAMUSCULAR | Status: AC
  Administered 2019-07-15: 10 mg via INTRAVENOUS
  Filled 2019-07-14: qty 1

## 2019-07-14 MED ORDER — PHENOL 1.4 % MT LIQD: 1.0000 | OROMUCOSAL | Status: DC | PRN

## 2019-07-14 MED ORDER — FENTANYL CITRATE (PF) 250 MCG/5ML IJ SOLN
INTRAMUSCULAR | Status: AC
  Filled 2019-07-14: qty 5

## 2019-07-14 MED ORDER — FENTANYL CITRATE (PF) 100 MCG/2ML IJ SOLN
INTRAMUSCULAR | Status: DC | PRN
  Administered 2019-07-14 (×2): 50 ug via INTRAVENOUS

## 2019-07-14 MED ORDER — PROPOFOL 10 MG/ML IV BOLUS
INTRAVENOUS | Status: AC
  Filled 2019-07-14: qty 20

## 2019-07-14 MED ORDER — MUPIROCIN 2 % EX OINT
1.0000 "application " | TOPICAL_OINTMENT | Freq: Two times a day (BID) | CUTANEOUS | Status: DC
  Administered 2019-07-14 – 2019-07-18 (×8): 1 via NASAL
  Filled 2019-07-14 (×2): qty 22

## 2019-07-14 MED ORDER — METOCLOPRAMIDE HCL 5 MG PO TABS: 5.0000 mg | ORAL_TABLET | Freq: Three times a day (TID) | ORAL | Status: DC | PRN

## 2019-07-14 MED ORDER — METHOCARBAMOL 1000 MG/10ML IJ SOLN
500.0000 mg | Freq: Four times a day (QID) | INTRAVENOUS | Status: DC | PRN
  Filled 2019-07-14: qty 5

## 2019-07-14 MED ORDER — ONDANSETRON HCL 4 MG/2ML IJ SOLN
INTRAMUSCULAR | Status: AC
  Filled 2019-07-14: qty 2

## 2019-07-14 MED ORDER — ROCURONIUM BROMIDE 100 MG/10ML IV SOLN
INTRAVENOUS | Status: DC | PRN
  Administered 2019-07-14: 20 mg via INTRAVENOUS

## 2019-07-14 MED ORDER — MENTHOL 3 MG MT LOZG: 1.0000 | LOZENGE | OROMUCOSAL | Status: DC | PRN

## 2019-07-14 MED ORDER — DEXTROSE 50 % IV SOLN
INTRAVENOUS | Status: AC
  Filled 2019-07-14: qty 50

## 2019-07-14 MED ORDER — SODIUM CHLORIDE 0.9 % IR SOLN
Status: DC | PRN
  Administered 2019-07-14: 3000 mL

## 2019-07-14 MED ORDER — POLYETHYLENE GLYCOL 3350 17 G PO PACK
17.0000 g | PACK | Freq: Two times a day (BID) | ORAL | Status: DC
  Administered 2019-07-14 – 2019-07-18 (×8): 17 g via ORAL
  Filled 2019-07-14 (×8): qty 1

## 2019-07-14 MED ORDER — METHOCARBAMOL 500 MG PO TABS
500.0000 mg | ORAL_TABLET | Freq: Four times a day (QID) | ORAL | Status: DC | PRN
  Administered 2019-07-17 – 2019-07-18 (×2): 500 mg via ORAL
  Filled 2019-07-14 (×2): qty 1

## 2019-07-14 MED ORDER — CEFAZOLIN SODIUM-DEXTROSE 2-4 GM/100ML-% IV SOLN
2.0000 g | Freq: Four times a day (QID) | INTRAVENOUS | Status: AC
  Administered 2019-07-14 – 2019-07-15 (×2): 2 g via INTRAVENOUS
  Filled 2019-07-14 (×2): qty 100

## 2019-07-14 MED ORDER — MAGNESIUM CITRATE PO SOLN: 1.0000 | Freq: Once | ORAL | Status: DC | PRN

## 2019-07-14 MED ORDER — CHLORHEXIDINE GLUCONATE CLOTH 2 % EX PADS
6.0000 | MEDICATED_PAD | Freq: Every day | CUTANEOUS | Status: DC
  Administered 2019-07-15 – 2019-07-18 (×4): 6 via TOPICAL

## 2019-07-14 MED ORDER — CEFAZOLIN SODIUM-DEXTROSE 2-4 GM/100ML-% IV SOLN
INTRAVENOUS | Status: AC
  Filled 2019-07-14: qty 100

## 2019-07-14 MED ORDER — PHENYLEPHRINE HCL-NACL 10-0.9 MG/250ML-% IV SOLN
INTRAVENOUS | Status: DC | PRN
  Administered 2019-07-14: 75 ug/min via INTRAVENOUS

## 2019-07-14 MED ORDER — FERROUS SULFATE 325 (65 FE) MG PO TABS
325.0000 mg | ORAL_TABLET | Freq: Two times a day (BID) | ORAL | Status: DC
  Administered 2019-07-14 – 2019-07-18 (×9): 325 mg via ORAL
  Filled 2019-07-14 (×8): qty 1

## 2019-07-14 MED ORDER — SUGAMMADEX SODIUM 200 MG/2ML IV SOLN
INTRAVENOUS | Status: DC | PRN
  Administered 2019-07-14: 400 mg via INTRAVENOUS

## 2019-07-14 MED ORDER — FENTANYL CITRATE (PF) 100 MCG/2ML IJ SOLN
INTRAMUSCULAR | Status: AC
  Filled 2019-07-14: qty 2

## 2019-07-14 MED ORDER — METOCLOPRAMIDE HCL 5 MG/ML IJ SOLN: 5.0000 mg | Freq: Three times a day (TID) | INTRAMUSCULAR | Status: DC | PRN

## 2019-07-14 MED ORDER — DIPHENHYDRAMINE HCL 12.5 MG/5ML PO ELIX: 12.5000 mg | ORAL_SOLUTION | ORAL | Status: DC | PRN

## 2019-07-14 MED ORDER — FENTANYL 50 MCG/HR TD PT72
1.0000 | MEDICATED_PATCH | TRANSDERMAL | Status: DC
  Administered 2019-07-16: 09:00:00 1 via TRANSDERMAL
  Filled 2019-07-14: qty 1

## 2019-07-14 MED ORDER — BISACODYL 10 MG RE SUPP: 10.0000 mg | Freq: Every day | RECTAL | Status: DC | PRN

## 2019-07-14 MED ORDER — HYDRALAZINE HCL 20 MG/ML IJ SOLN: 10.0000 mg | INTRAMUSCULAR | Status: DC | PRN

## 2019-07-14 MED ORDER — 0.9 % SODIUM CHLORIDE (POUR BTL) OPTIME
TOPICAL | Status: DC | PRN
  Administered 2019-07-14: 1000 mL

## 2019-07-14 MED ORDER — HYDROMORPHONE HCL 1 MG/ML IJ SOLN
1.0000 mg | INTRAMUSCULAR | Status: DC | PRN
  Administered 2019-07-15: 1 mg via INTRAVENOUS
  Filled 2019-07-14: qty 1

## 2019-07-14 MED ORDER — FENTANYL CITRATE (PF) 100 MCG/2ML IJ SOLN
25.0000 ug | INTRAMUSCULAR | Status: DC | PRN
  Administered 2019-07-14: 25 ug via INTRAVENOUS

## 2019-07-14 MED ORDER — DEXAMETHASONE SODIUM PHOSPHATE 10 MG/ML IJ SOLN
INTRAMUSCULAR | Status: AC
  Filled 2019-07-14: qty 1

## 2019-07-14 MED ORDER — PROPOFOL 10 MG/ML IV BOLUS
INTRAVENOUS | Status: DC | PRN
  Administered 2019-07-14: 120 mg via INTRAVENOUS

## 2019-07-14 MED ORDER — CEFAZOLIN SODIUM-DEXTROSE 1-4 GM/50ML-% IV SOLN
INTRAVENOUS | Status: DC | PRN
  Administered 2019-07-14: 2 g via INTRAVENOUS

## 2019-07-14 MED ORDER — SODIUM CHLORIDE 0.9 % IV SOLN: INTRAVENOUS | Status: DC

## 2019-07-14 MED ORDER — LACTATED RINGERS IV SOLN: INTRAVENOUS | Status: DC | PRN

## 2019-07-14 MED ORDER — PHENYLEPHRINE HCL (PRESSORS) 10 MG/ML IV SOLN
INTRAVENOUS | Status: DC | PRN
Start: 2019-07-14 — End: 2019-07-14
  Administered 2019-07-14 (×3): 80 ug via INTRAVENOUS

## 2019-07-14 MED ORDER — PHENYLEPHRINE HCL (PRESSORS) 10 MG/ML IV SOLN
40.0000 ug | Freq: Once | INTRAVENOUS | Status: AC
  Administered 2019-07-14: 40 ug via INTRAVENOUS

## 2019-07-14 MED ORDER — SUCCINYLCHOLINE CHLORIDE 20 MG/ML IJ SOLN
INTRAMUSCULAR | Status: DC | PRN
  Administered 2019-07-14: 120 mg via INTRAVENOUS

## 2019-07-14 MED ORDER — ONDANSETRON HCL 4 MG/2ML IJ SOLN: 4.0000 mg | Freq: Once | INTRAMUSCULAR | Status: DC | PRN

## 2019-07-14 MED ORDER — LIDOCAINE 2% (20 MG/ML) 5 ML SYRINGE
INTRAMUSCULAR | Status: DC | PRN
  Administered 2019-07-14: 50 mg via INTRAVENOUS

## 2019-07-14 MED ORDER — DOCUSATE SODIUM 100 MG PO CAPS
100.0000 mg | ORAL_CAPSULE | Freq: Two times a day (BID) | ORAL | Status: DC
  Administered 2019-07-14 – 2019-07-18 (×8): 100 mg via ORAL
  Filled 2019-07-14 (×8): qty 1

## 2019-07-14 MED ORDER — OXYCODONE HCL 5 MG PO TABS
10.0000 mg | ORAL_TABLET | Freq: Two times a day (BID) | ORAL | Status: DC
  Administered 2019-07-14 – 2019-07-18 (×6): 10 mg via ORAL
  Filled 2019-07-14 (×6): qty 2

## 2019-07-14 SURGICAL SUPPLY — 58 items
BNDG CMPR MED 10X6 ELC LF (GAUZE/BANDAGES/DRESSINGS) ×1
BNDG CMPR MED 15X6 ELC VLCR LF (GAUZE/BANDAGES/DRESSINGS)
BNDG ELASTIC 4X5.8 VLCR STR LF (GAUZE/BANDAGES/DRESSINGS) ×1 IMPLANT
BNDG ELASTIC 6X10 VLCR STRL LF (GAUZE/BANDAGES/DRESSINGS) ×1 IMPLANT
BNDG ELASTIC 6X15 VLCR STRL LF (GAUZE/BANDAGES/DRESSINGS) IMPLANT
BNDG GAUZE ELAST 4 BULKY (GAUZE/BANDAGES/DRESSINGS) ×2 IMPLANT
COVER WAND RF STERILE (DRAPES) ×1 IMPLANT
CUFF TOURN SGL QUICK 18X4 (TOURNIQUET CUFF) ×1 IMPLANT
CUFF TOURN SGL QUICK 24 (TOURNIQUET CUFF)
CUFF TOURN SGL QUICK 34 (TOURNIQUET CUFF) ×2
CUFF TOURN SGL QUICK 42 (TOURNIQUET CUFF) IMPLANT
CUFF TRNQT CYL 24X4X16.5-23 (TOURNIQUET CUFF) IMPLANT
CUFF TRNQT CYL 34X4.125X (TOURNIQUET CUFF) IMPLANT
DRAPE SURG 17X23 STRL (DRAPES) ×2 IMPLANT
DRAPE U-SHAPE 47X51 STRL (DRAPES) ×2 IMPLANT
DRSG EMULSION OIL 3X3 NADH (GAUZE/BANDAGES/DRESSINGS) ×1 IMPLANT
DRSG PAD ABDOMINAL 8X10 ST (GAUZE/BANDAGES/DRESSINGS) ×1 IMPLANT
ELECT REM PT RETURN 9FT ADLT (ELECTROSURGICAL)
ELECTRODE REM PT RTRN 9FT ADLT (ELECTROSURGICAL) IMPLANT
FACESHIELD WRAPAROUND (MASK) IMPLANT
FACESHIELD WRAPAROUND OR TEAM (MASK) ×1 IMPLANT
GAUZE SPONGE 4X4 12PLY STRL (GAUZE/BANDAGES/DRESSINGS) ×2 IMPLANT
GAUZE SPONGE 4X4 12PLY STRL LF (GAUZE/BANDAGES/DRESSINGS) ×1 IMPLANT
GAUZE XEROFORM 5X9 LF (GAUZE/BANDAGES/DRESSINGS) ×1 IMPLANT
GLOVE BIOGEL PI IND STRL 7.5 (GLOVE) ×1 IMPLANT
GLOVE BIOGEL PI IND STRL 8 (GLOVE) ×1 IMPLANT
GLOVE BIOGEL PI INDICATOR 7.5 (GLOVE) ×1
GLOVE BIOGEL PI INDICATOR 8 (GLOVE) ×1
GLOVE ECLIPSE 8.0 STRL XLNG CF (GLOVE) ×1 IMPLANT
GLOVE ORTHO TXT STRL SZ7.5 (GLOVE) ×2 IMPLANT
GLOVE SURG ORTHO 8.0 STRL STRW (GLOVE) ×2 IMPLANT
GOWN STRL REIN 3XL XLG LVL4 (GOWN DISPOSABLE) ×2 IMPLANT
GOWN STRL REUS W/ TWL LRG LVL3 (GOWN DISPOSABLE) ×3 IMPLANT
GOWN STRL REUS W/TWL LRG LVL3 (GOWN DISPOSABLE) ×6
HANDPIECE INTERPULSE COAX TIP (DISPOSABLE)
KIT BASIN OR (CUSTOM PROCEDURE TRAY) ×2 IMPLANT
KIT TURNOVER KIT B (KITS) ×2 IMPLANT
MANIFOLD NEPTUNE II (INSTRUMENTS) ×2 IMPLANT
NS IRRIG 1000ML POUR BTL (IV SOLUTION) ×2 IMPLANT
PACK ORTHO EXTREMITY (CUSTOM PROCEDURE TRAY) ×2 IMPLANT
PAD ABD 8X10 STRL (GAUZE/BANDAGES/DRESSINGS) ×1 IMPLANT
PAD ARMBOARD 7.5X6 YLW CONV (MISCELLANEOUS) ×4 IMPLANT
PADDING CAST COTTON 6X4 STRL (CAST SUPPLIES) ×1 IMPLANT
SET HNDPC FAN SPRY TIP SCT (DISPOSABLE) IMPLANT
SPONGE LAP 18X18 RF (DISPOSABLE) ×2 IMPLANT
SPONGE LAP 4X18 RFD (DISPOSABLE) ×2 IMPLANT
STOCKINETTE IMPERVIOUS 9X36 MD (GAUZE/BANDAGES/DRESSINGS) ×2 IMPLANT
SUCTION FRAZIER HANDLE 10FR (MISCELLANEOUS)
SUCTION TUBE FRAZIER 10FR DISP (MISCELLANEOUS) IMPLANT
SUT ETHILON 3 0 PS 1 (SUTURE) ×2 IMPLANT
SUT SILK 2 0 PERMA HAND 18 BK (SUTURE) ×1 IMPLANT
SWAB CULTURE ESWAB REG 1ML (MISCELLANEOUS) IMPLANT
TOWEL GREEN STERILE (TOWEL DISPOSABLE) ×2 IMPLANT
TOWEL GREEN STERILE FF (TOWEL DISPOSABLE) ×2 IMPLANT
TUBE CONNECTING 12X1/4 (SUCTIONS) ×2 IMPLANT
UNDERPAD 30X30 (UNDERPADS AND DIAPERS) ×2 IMPLANT
WATER STERILE IRR 1000ML POUR (IV SOLUTION) ×2 IMPLANT
YANKAUER SUCT BULB TIP NO VENT (SUCTIONS) ×2 IMPLANT

## 2019-07-14 NOTE — Plan of Care (Signed)

## 2019-07-14 NOTE — Anesthesia Preprocedure Evaluation (Addendum)
Anesthesia Evaluation  Patient identified by MRN, date of birth, ID band Patient awake    Reviewed: Allergy & Precautions, NPO status , Patient's Chart, lab work & pertinent test results  Airway Mallampati: II  TM Distance: >3 FB Neck ROM: Full    Dental  (+) Edentulous Upper, Edentulous Lower   Pulmonary asthma , pneumonia, resolved, former smoker,    Pulmonary exam normal breath sounds clear to auscultation       Cardiovascular hypertension, Pt. on medications Normal cardiovascular exam Rhythm:Regular Rate:Normal  EKG 06/18/19 Atrial Fibrillation, RBBB pattern   Neuro/Psych Depression Restless legs syndrome  Neuromuscular disease    GI/Hepatic negative GI ROS, (+)     substance abuse  , Opioid dependence   Endo/Other  diabetes, Poorly Controlled, Type 2, Insulin DependentHyperthyroidism Morbid obesityHyperlipidemia  Renal/GU Renal InsufficiencyRenal disease  negative genitourinary   Musculoskeletal  (+) Arthritis , Osteoarthritis,  Fibromyalgia -, narcotic dependentRight leg hematoma Compression Fx T spine    Abdominal (+) + obese,   Peds  Hematology  (+) anemia , Anticoagulant use - Eliquis therapy- last dose 07/13/19   Anesthesia Other Findings   Reproductive/Obstetrics                          Anesthesia Physical Anesthesia Plan  ASA: III  Anesthesia Plan: General   Post-op Pain Management:    Induction: Intravenous  PONV Risk Score and Plan: 4 or greater and Ondansetron and Treatment may vary due to age or medical condition  Airway Management Planned: Oral ETT  Additional Equipment:   Intra-op Plan:   Post-operative Plan: Extubation in OR  Informed Consent: I have reviewed the patients History and Physical, chart, labs and discussed the procedure including the risks, benefits and alternatives for the proposed anesthesia with the patient or authorized representative who  has indicated his/her understanding and acceptance.    Discussed DNR with patient and Suspend DNR.   Dental advisory given  Plan Discussed with: CRNA and Surgeon  Anesthesia Plan Comments:        Anesthesia Quick Evaluation

## 2019-07-14 NOTE — Transfer of Care (Signed)
Immediate Anesthesia Transfer of Care Note  Patient: Ashley Caldwell  Procedure(s) Performed: EVACUATION HEMATOMA (Right Leg Upper)  Patient Location: PACU  Anesthesia Type:General  Level of Consciousness: sedated  Airway & Oxygen Therapy: Patient Spontanous Breathing and Patient connected to nasal cannula oxygen  Post-op Assessment: Report given to RN, Post -op Vital signs reviewed and stable and Patient moving all extremities X 4  Post vital signs: Reviewed and stable  Last Vitals:  Vitals Value Taken Time  BP 84/41 07/14/19 1446  Temp    Pulse    Resp 24 07/14/19 1447  SpO2    Vitals shown include unvalidated device data.  Last Pain:  Vitals:   07/14/19 1200  TempSrc:   PainSc: 6          Complications: No apparent anesthesia complications

## 2019-07-14 NOTE — Progress Notes (Signed)
PROGRESS NOTE  Ashley Caldwell ZWC:[id number] DOB: 1940/10/20 DOA: 07/13/2019 PCP: System, Pcp Not In  HPI/Recap of past 24 hours: Ashley Caldwell is a 79 y.o. 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 fib.  Presently on apixaban had hurt her leg after she fell onto her wheelchair about a week ago.  Since then she has developed a hematoma.  ED Course: In the ER patient had a CT scan which shows large hematoma of the right leg for which orthopedic surgeon Dr. Ihor Gully was consulted and plan to have clot removed 07/14/19.  Labs show hemoglobin of 11.4 platelets 259 creatinine 1.7 increased from baseline of around 1.3 blood glucose 119 WBC 11.7 Covid test was negative.  07/14/19: Seen and examined.  She reports significant pain in her right lower extremity but also in her left lower extremity and associated with cramps.  Restarted on her home pain medications.  IV Dilaudid added as needed for severe pain.  Assessment/Plan: Principal Problem:   Leg hematoma Active Problems:   DM type 2 (diabetes mellitus, type 2) (HCC)   HTN (hypertension), benign   Psoriatic arthritis (HCC)   Anemia   Asthma  Right leg hematoma post trauma in the setting of anticoagulation with Eliquis Orthopedic surgery Dr. Geanie Cooley for possible drainage today Continue to hold off Eliquis Monitor H&H  Newly diagnosed A. Fib Recently diagnosed with A. fib started on Eliquis Rate controlled hold off Eliquis right lower extremity hematoma Obtain twelve-lead EKG Continue to monitor on telemetry  AKI on CKD 3B Presented with creatinine 1.77 with GFR of 27 Baseline creatinine appears to be 1.4 with GFR 34 Avoid nephrotoxins Monitor urine output Repeat BMP in the morning  Leukocytosis, unclear etiology Presented with WBC 11.7 trending up to 14.7 Obtain urine analysis, urine culture  Hypothyroidism Resume  levothyroxine  Essential hypertension Resume home medications She is on Lopressor p.o. 25 mg twice daily  COPD Resume home medications Resume bronchodilators Negative overcirculation greater than 92%  History of COVID-19 viral infection Diagnosed on 04/22/2019  Chronic anxiety/depression Resume home medications  Recent herpes ophthalmicus of the right eye Appears stable  Chronic pain syndrome Resume home medication  Type 2 diabetes with hyperglycemia Obtain A1c Continue insulin sliding scale  Morbid obesity BMI 42 Recommend weight loss outpatient   Ambulatory dysfunction PT OT to assess Fall precautions  DVT prophylaxis: Will need to start anticoagulation after surgery according to orthopedics. Code Status: DNR. Family Communication: Discussed with patient.  Disposition Plan:  Patient is from SNF.  Anticipate discharge back to SNF in the next 48 to 72 hours or when orthopedic surgery signs off.  Barrier to discharge: Possible orthopedic procedure today.  Consults called: Orthopedics.      Objective: Vitals:   07/14/19 0717 07/14/19 0845 07/14/19 0915 07/14/19 1020  BP: (!) 85/62 100/63 100/63   Pulse:   65   Resp: 17 11 18    Temp:      TempSrc:      SpO2:   99% 95%  Weight:      Height:       No intake or output data in the 24 hours ending 07/14/19 1049 Filed Weights   07/13/19 1439  Weight: 129.7 kg    Exam:  . General: 79 y.o. year-old female morbidly obese.  Alert and oriented x3. . Cardiovascular: Regular rate and rhythm with no rubs or gallops.   Marland Kitchen  Respiratory: Clear to auscultation with no wheezes or rales. Good inspiratory effort. . Abdomen: Soft nontender nondistended with normal bowel sounds x4 quadrants. . Musculoskeletal: No lower extremity edema . Skin: Right lower extremity bleeding from site of hematoma. Marland Kitchen Psychiatry: Mood is appropriate for condition and setting   Data Reviewed: CBC: Recent Labs  Lab 07/13/19 1907  07/14/19 0332  WBC 11.7* 14.7*  NEUTROABS 8.8*  --   HGB 11.4* 9.2*  HCT 35.9* 28.9*  MCV 110.1* 112.0*  PLT 259 228   Basic Metabolic Panel: Recent Labs  Lab 07/13/19 1907 07/14/19 0332  NA 142 137  K 3.6 4.2  CL 99 99  CO2 23 26  GLUCOSE 119* 175*  BUN 16 22  CREATININE 1.77* 1.47*  CALCIUM 8.9 8.2*   GFR: Estimated Creatinine Clearance: 47.1 mL/min (A) (by C-G formula based on SCr of 1.47 mg/dL (H)). Liver Function Tests: Recent Labs  Lab 07/13/19 1907  AST 104*  ALT 50*  ALKPHOS 337*  BILITOT 1.7*  PROT 5.5*  ALBUMIN 2.9*   No results for input(s): LIPASE, AMYLASE in the last 168 hours. No results for input(s): AMMONIA in the last 168 hours. Coagulation Profile: No results for input(s): INR, PROTIME in the last 168 hours. Cardiac Enzymes: No results for input(s): CKTOTAL, CKMB, CKMBINDEX, TROPONINI in the last 168 hours. BNP (last 3 results) No results for input(s): PROBNP in the last 8760 hours. HbA1C: No results for input(s): HGBA1C in the last 72 hours. CBG: Recent Labs  Lab 07/13/19 1825 07/13/19 2356 07/14/19 0306 07/14/19 0407 07/14/19 0844  GLUCAP 91 158* 158* 152* 103*   Lipid Profile: No results for input(s): CHOL, HDL, LDLCALC, TRIG, CHOLHDL, LDLDIRECT in the last 72 hours. Thyroid Function Tests: No results for input(s): TSH, T4TOTAL, FREET4, T3FREE, THYROIDAB in the last 72 hours. Anemia Panel: No results for input(s): VITAMINB12, FOLATE, FERRITIN, TIBC, IRON, RETICCTPCT in the last 72 hours. Urine analysis:    Component Value Date/Time   COLORURINE AMBER (A) 06/18/2019 0102   APPEARANCEUR HAZY (A) 06/18/2019 0102   LABSPEC 1.015 06/18/2019 0102   PHURINE 5.0 06/18/2019 0102   GLUCOSEU NEGATIVE 06/18/2019 0102   HGBUR NEGATIVE 06/18/2019 0102   BILIRUBINUR NEGATIVE 06/18/2019 0102   KETONESUR NEGATIVE 06/18/2019 0102   PROTEINUR NEGATIVE 06/18/2019 0102   UROBILINOGEN 0.2 01/13/2011 1819   NITRITE NEGATIVE 06/18/2019 0102    LEUKOCYTESUR NEGATIVE 06/18/2019 0102   Sepsis Labs: @LABRCNTIP (procalcitonin:4,lacticidven:4)  ) Recent Results (from the past 240 hour(s))  Respiratory Panel by RT PCR (Flu A&B, Covid) - Nasopharyngeal Swab     Status: None   Collection Time: 07/13/19 10:11 PM   Specimen: Nasopharyngeal Swab  Result Value Ref Range Status   SARS Coronavirus 2 by RT PCR NEGATIVE NEGATIVE Final    Comment: (NOTE) SARS-CoV-2 target nucleic acids are NOT DETECTED. The SARS-CoV-2 RNA is generally detectable in upper respiratoy specimens during the acute phase of infection. The lowest concentration of SARS-CoV-2 viral copies this assay can detect is 131 copies/mL. A negative result does not preclude SARS-Cov-2 infection and should not be used as the sole basis for treatment or other patient management decisions. A negative result may occur with  improper specimen collection/handling, submission of specimen other than nasopharyngeal swab, presence of viral mutation(s) within the areas targeted by this assay, and inadequate number of viral copies (<131 copies/mL). A negative result must be combined with clinical observations, patient history, and epidemiological information. The expected result is Negative. Fact Sheet for Patients:  https://www.moore.com/ Fact Sheet for Healthcare Providers:  https://www.young.biz/ This test is not yet ap proved or cleared by the [place] FDA and  has been authorized for detection and/or diagnosis of SARS-CoV-2 by FDA under an Emergency Use Authorization (EUA). This EUA will remain  in effect (meaning this test can be used) for the duration of the COVID-19 declaration under Section 564(b)(1) of the Act, 21 U.S.C. section 360bbb-3(b)(1), unless the authorization is terminated or revoked sooner.    Influenza A by PCR NEGATIVE NEGATIVE Final   Influenza B by PCR NEGATIVE NEGATIVE Final    Comment: (NOTE) The Xpert Xpress  SARS-CoV-2/FLU/RSV assay is intended as an aid in  the diagnosis of influenza from Nasopharyngeal swab specimens and  should not be used as a sole basis for treatment. Nasal washings and  aspirates are unacceptable for Xpert Xpress SARS-CoV-2/FLU/RSV  testing. Fact Sheet for Patients: https://www.moore.com/ Fact Sheet for Healthcare Providers: https://www.young.biz/ This test is not yet approved or cleared by the [place] FDA and  has been authorized for detection and/or diagnosis of SARS-CoV-2 by  FDA under an Emergency Use Authorization (EUA). This EUA will remain  in effect (meaning this test can be used) for the duration of the  Covid-19 declaration under Section 564(b)(1) of the Act, 21  U.S.C. section 360bbb-3(b)(1), unless the authorization is  terminated or revoked. Performed at [facility], [address], [place], [place] [address]       Studies: CT Tibia Fibula Right Wo Contrast  Result Date: 07/13/2019 CLINICAL DATA:  Blunt trauma 1 week ago with known hematoma, active bleeding today EXAM: CT OF THE LOWER RIGHT EXTREMITY WITHOUT CONTRAST TECHNIQUE: Multidetector CT imaging of the right lower extremity was performed according to the standard protocol. COMPARISON:  None. FINDINGS: Bones/Joint/Cartilage Degenerative changes are noted in the knee joint in all 3 compartments. No posttraumatic abnormality in the distal femur is seen. The tibia and fibula are well visualize without evidence of acute fracture or dislocation. Mild osteopenia is noted. The bony structures of the foot are within normal limits. No acute fracture is seen. Ligaments Suboptimally assessed by CT. Muscles and Tendons Relative muscular atrophy is noted with fatty infiltration identified. Soft tissues Some generalized edema is noted throughout the lower extremity on the right. There is a large mixed attenuation subcutaneous collection identified which measures  approximately 18 x 3 cm in greatest transverse and AP dimensions. It extends for approximately 22 cm in craniocaudad projection and is consistent with a focal hematoma. This lies along the lateral aspect of the lower leg. Given its mixed attenuation it likely represents a combination of acute and chronic hematoma. A few small air foci are noted posteriorly likely representing skin breakdown. Correlate with the clinical exam. No other definitive hematoma is noted. No other soft tissue abnormality is seen. IMPRESSION: Large hematoma involving the lateral and posterior aspect of the right lower leg as described. This is consistent with the given clinical history of recent trauma. A few small foci of air are noted posteriorly likely related to local breakdown of the skin. Mild associated edema is noted within the lower extremity. Degenerative changes of the knee joint. No acute bony abnormality is noted. Electronically Signed   By: Alcide Clever M.D.   On: 07/13/2019 21:08   CT Foot Right Wo Contrast  Result Date: 07/13/2019 CLINICAL DATA:  Blunt trauma 1 week ago with known hematoma, active bleeding today EXAM: CT OF THE LOWER RIGHT EXTREMITY WITHOUT CONTRAST TECHNIQUE: Multidetector  CT imaging of the right lower extremity was performed according to the standard protocol. COMPARISON:  None. FINDINGS: Bones/Joint/Cartilage Degenerative changes are noted in the knee joint in all 3 compartments. No posttraumatic abnormality in the distal femur is seen. The tibia and fibula are well visualize without evidence of acute fracture or dislocation. Mild osteopenia is noted. The bony structures of the foot are within normal limits. No acute fracture is seen. Ligaments Suboptimally assessed by CT. Muscles and Tendons Relative muscular atrophy is noted with fatty infiltration identified. Soft tissues Some generalized edema is noted throughout the lower extremity on the right. There is a large mixed attenuation subcutaneous  collection identified which measures approximately 18 x 3 cm in greatest transverse and AP dimensions. It extends for approximately 22 cm in craniocaudad projection and is consistent with a focal hematoma. This lies along the lateral aspect of the lower leg. Given its mixed attenuation it likely represents a combination of acute and chronic hematoma. A few small air foci are noted posteriorly likely representing skin breakdown. Correlate with the clinical exam. No other definitive hematoma is noted. No other soft tissue abnormality is seen. IMPRESSION: Large hematoma involving the lateral and posterior aspect of the right lower leg as described. This is consistent with the given clinical history of recent trauma. A few small foci of air are noted posteriorly likely related to local breakdown of the skin. Mild associated edema is noted within the lower extremity. Degenerative changes of the knee joint. No acute bony abnormality is noted. Electronically Signed   By: Alcide Clever M.D.   On: 07/13/2019 21:08    Scheduled Meds: . baclofen  5 mg Oral TID  . divalproex  125 mg Oral BID  . DULoxetine  120 mg Oral Daily  . [START ON 07/16/2019] fentaNYL  1 patch Transdermal Q72H  . insulin aspart  0-9 Units Subcutaneous Q4H  . levothyroxine  100 mcg Oral Q0600  . metoprolol tartrate  25 mg Oral BID  . mometasone-formoterol  2 puff Inhalation BID  . montelukast  10 mg Oral Q1400  . oxyCODONE  10 mg Oral BID  . rOPINIRole  0.5 mg Oral QHS  . traZODone  100 mg Oral QHS    Continuous Infusions:   LOS: 1 day     Darlin Drop, MD [facility] Pager [phone]  If 7PM-7AM, please contact night-coverage [email or web address] Password [facility] 07/14/2019, 10:49 AM

## 2019-07-14 NOTE — Interval H&P Note (Signed)
History and Physical Interval Note:  07/14/2019 1:23 PM  Ashley Caldwell  has presented today for surgery, with the diagnosis of Right Leg Hematoma.  The various methods of treatment have been discussed with the patient and family. After consideration of risks, benefits and other options for treatment, the patient has consented to  Procedure(s): EVACUATION HEMATOMA (Right) as a surgical intervention.  The patient's history has been reviewed, patient examined, no change in status, stable for surgery.  I have reviewed the patient's chart and labs.  Questions were answered to the patient's satisfaction.     Shelda Pal

## 2019-07-14 NOTE — Brief Op Note (Signed)
07/14/2019  3:18 PM  PATIENT:  Ashley Caldwell  79 y.o. female  PRE-OPERATIVE DIAGNOSIS:  Right lower posterior leg hematoma  POST-OPERATIVE DIAGNOSIS:  Right lower posterior leg hematoma  PROCEDURE:  Procedure(s): EVACUATION HEMATOMA (Right)  SURGEON:  Surgeon(s) and Role:    Durene Romans, MD - Primary  PHYSICIAN ASSISTANT: Dennie Bible, PA-C  ANESTHESIA:   general  EBL:  100 mL   BLOOD ADMINISTERED:none  DRAINS: none   LOCAL MEDICATIONS USED:  NONE  SPECIMEN:  No Specimen  DISPOSITION OF SPECIMEN:  N/A  COUNTS:  YES  TOURNIQUET:   Total Tourniquet Time Documented: Thigh (Right) - 18 minutes Total: Thigh (Right) - 18 minutes   DICTATION: .Other Dictation: Dictation Number [id number]  PLAN OF CARE: Admit to inpatient   PATIENT DISPOSITION:  PACU - hemodynamically stable.   Delay start of Pharmacological VTE agent (>24hrs) due to surgical blood loss or risk of bleeding: yes

## 2019-07-14 NOTE — Anesthesia Procedure Notes (Signed)
Procedure Name: Intubation Date/Time: 07/14/2019 2:03 PM Performed by: Neldon Newport, CRNA Pre-anesthesia Checklist: Timeout performed, Patient being monitored, Suction available, Emergency Drugs available and Patient identified Patient Re-evaluated:Patient Re-evaluated prior to induction Oxygen Delivery Method: Circle system utilized Preoxygenation: Pre-oxygenation with 100% oxygen Induction Type: IV induction and Rapid sequence Ventilation: Mask ventilation without difficulty and Oral airway inserted - appropriate to patient size Laryngoscope Size: Mac and 3 Grade View: Grade I Tube type: Oral Tube size: 7.0 mm Number of attempts: 1 Placement Confirmation: ETT inserted through vocal cords under direct vision,  positive ETCO2 and breath sounds checked- equal and bilateral Secured at: 21 cm Tube secured with: Tape Dental Injury: Teeth and Oropharynx as per pre-operative assessment

## 2019-07-14 NOTE — Op Note (Signed)
NAME: Ashley Caldwell, Ashley Caldwell MEDICAL RECORD BP:[id number] ACCOUNT [id number] DATE OF BIRTH:[birth year] FACILITY: [facility] LOCATION: [facility] PHYSICIAN:Kahlie Deutscher Marian Sorrow, MD  OPERATIVE REPORT  DATE OF PROCEDURE:  07/14/2019  PREOPERATIVE DIAGNOSIS:  Large right posterior leg hematoma, subcutaneous.  POSTOPERATIVE DIAGNOSIS:  Large right posterior leg hematoma, subcutaneous, with 10-inch skin laceration.  PROCEDURE:   1.  Evacuation of right posterior subcutaneous leg hematoma. 2.  Complex wound primary closure.  SURGEON:  Paralee Cancel, MD  ASSISTANT:  Griffith Citron, PA-C.  Note that Ms. Ashley Caldwell was present for the entirety of the case from preoperative position, perioperative management of the operative extremity, general facilitation of the case and primary wound closure.  ANESTHESIA:  General.  ESTIMATED BLOOD LOSS:  Minimal.  TOURNIQUET:  Was up and utilized for 18 minutes at 250 mmHg.  INDICATIONS:  The patient is a 79 year old female who is a current nursing home resident.  She reported history of 1-2 weeks ago, hitting the back of her leg against her wheelchair.  She presented to the emergency room on the 26th with a lot of pain and  swelling in the posterior aspect of the leg.  When she was seen in the emergency room during their evaluation, they recognized a large tear in her skin in the posterior aspect of the leg superficial to this large swelling.  CT scan had been ordered by  the emergency room physicians indicating large swelling what was subcutaneous and not within the musculature.  She was admitted to the hospitalist service based on her multiple medical issues.  She was seen and evaluated and discussed the need to remove  the hematoma to decompress the skin to allow it to be closed as well as for pain control associated with this.  Consent was obtained.  DESCRIPTION OF PROCEDURE:  The patient was brought to the operative theater.  Once adequate anesthesia was established,  preoperative antibiotics including Ancef administered.  She was positioned supine.  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  She had external rotation contracture of her hip.  She had significant lower extremity edema.  The flexion on the right lower extremity was less than 90 degrees.  The right lower extremity was  then prepped and draped from the proximal thigh below the tourniquet to the toes.  The timeout was performed identifying the patient, the planned procedure and extremity.  The tourniquet was elevated to 250 mmHg for which it stayed up for 18 minutes.   The patient's wound-inflicted laceration was approximately 10 inches long.  I basically used this for the decompression of this hematoma.  The entire hematoma was evacuated.  Once I evacuated the entire hematoma we used pulse lavage and irrigated the  wound out.  We identified that her skin was extremely thin.  There would be no way to perform any subcutaneous sutures.  I thus used staples.  The staples were utilized to close the incision over the entire extent.  The wound was then cleaned, dried and  dressed sterilely using Xeroform and a bulky wrap that included from the lower leg all the way above the knee.  This was placed for compression purposes as well as to try to prevent stress and strain across the posterior aspect of the leg while the wound  tried to heal.  She was then brought to the recovery room in stable condition, extubated, tolerated the procedure well.  I reviewed these findings with her daughter.  My hope is that this wound heals without requiring further intervention due to the  extremely thin nature of her skin.  If further treatment were required, it may require skin grafting or other advanced treatment that may require the assistance of plastic surgery.  CN/NUANCE  D:07/14/2019 T:07/14/2019 JOB:[id number]

## 2019-07-14 NOTE — Anesthesia Postprocedure Evaluation (Signed)
Anesthesia Post Note  Patient: Ashley Caldwell  Procedure(s) Performed: EVACUATION HEMATOMA (Right Leg Upper)     Patient location during evaluation: PACU Anesthesia Type: General Level of consciousness: awake and alert and oriented Pain management: pain level controlled Vital Signs Assessment: post-procedure vital signs reviewed and stable Respiratory status: spontaneous breathing, nonlabored ventilation, respiratory function stable and patient connected to nasal cannula oxygen Cardiovascular status: blood pressure returned to baseline and stable Postop Assessment: no apparent nausea or vomiting Anesthetic complications: no    Last Vitals:  Vitals:   07/14/19 1134 07/14/19 1525  BP: 100/60   Pulse: 87   Resp: 17   Temp: 36.8 C 36.8 C  SpO2: 97%     Last Pain:  Vitals:   07/14/19 1525  TempSrc:   PainSc: 6         RLE Motor Response: Purposeful movement (07/14/19 1525) RLE Sensation: Full sensation (07/14/19 1525)      Mahki Spikes A.

## 2019-07-14 NOTE — ED Notes (Signed)
Pt stated she had SOB. Pt had good wave pleath and O2 was in the 70s. Tech notified nurse and put pt on liters nasal canula.

## 2019-07-15 ENCOUNTER — Inpatient Hospital Stay (HOSPITAL_COMMUNITY): Payer: Medicare HMO

## 2019-07-15 LAB — CBC WITH DIFFERENTIAL/PLATELET
Abs Immature Granulocytes: 0.04 10*3/uL (ref 0.00–0.07)
Basophils Absolute: 0.1 10*3/uL (ref 0.0–0.1)
Basophils Relative: 1 %
Eosinophils Absolute: 0.2 10*3/uL (ref 0.0–0.5)
Eosinophils Relative: 2 %
HCT: 20.9 % — ABNORMAL LOW (ref 36.0–46.0)
Hemoglobin: 7 g/dL — ABNORMAL LOW (ref 12.0–15.0)
Immature Granulocytes: 0 %
Lymphocytes Relative: 26 %
Lymphs Abs: 2.4 10*3/uL (ref 0.7–4.0)
MCH: 35.7 pg — ABNORMAL HIGH (ref 26.0–34.0)
MCHC: 33.5 g/dL (ref 30.0–36.0)
MCV: 106.6 fL — ABNORMAL HIGH (ref 80.0–100.0)
Monocytes Absolute: 1.3 10*3/uL — ABNORMAL HIGH (ref 0.1–1.0)
Monocytes Relative: 14 %
Neutro Abs: 5.5 10*3/uL (ref 1.7–7.7)
Neutrophils Relative %: 57 %
Platelets: 216 10*3/uL (ref 150–400)
RBC: 1.96 MIL/uL — ABNORMAL LOW (ref 3.87–5.11)
RDW: 19.2 % — ABNORMAL HIGH (ref 11.5–15.5)
WBC: 9.5 10*3/uL (ref 4.0–10.5)
nRBC: 0 % (ref 0.0–0.2)

## 2019-07-15 LAB — GLUCOSE, CAPILLARY
Glucose-Capillary: 187 mg/dL — ABNORMAL HIGH (ref 70–99)
Glucose-Capillary: 194 mg/dL — ABNORMAL HIGH (ref 70–99)
Glucose-Capillary: 211 mg/dL — ABNORMAL HIGH (ref 70–99)
Glucose-Capillary: 253 mg/dL — ABNORMAL HIGH (ref 70–99)
Glucose-Capillary: 320 mg/dL — ABNORMAL HIGH (ref 70–99)
Glucose-Capillary: 93 mg/dL (ref 70–99)

## 2019-07-15 LAB — COMPREHENSIVE METABOLIC PANEL
ALT: 27 U/L (ref 0–44)
AST: 52 U/L — ABNORMAL HIGH (ref 15–41)
Albumin: 2.2 g/dL — ABNORMAL LOW (ref 3.5–5.0)
Alkaline Phosphatase: 189 U/L — ABNORMAL HIGH (ref 38–126)
Anion gap: 14 (ref 5–15)
BUN: 27 mg/dL — ABNORMAL HIGH (ref 8–23)
CO2: 28 mmol/L (ref 22–32)
Calcium: 7.8 mg/dL — ABNORMAL LOW (ref 8.9–10.3)
Chloride: 95 mmol/L — ABNORMAL LOW (ref 98–111)
Creatinine, Ser: 1.94 mg/dL — ABNORMAL HIGH (ref 0.44–1.00)
GFR calc Af Amer: 28 mL/min — ABNORMAL LOW (ref >60)
GFR calc non Af Amer: 25 mL/min — ABNORMAL LOW (ref >60)
Glucose, Bld: 145 mg/dL — ABNORMAL HIGH (ref 70–99)
Potassium: 4.1 mmol/L (ref 3.5–5.1)
Sodium: 137 mmol/L (ref 135–145)
Total Bilirubin: 0.9 mg/dL (ref 0.3–1.2)
Total Protein: 4 g/dL — ABNORMAL LOW (ref 6.5–8.1)

## 2019-07-15 LAB — LACTIC ACID, PLASMA: Lactic Acid, Venous: 2.7 mmol/L (ref 0.5–1.9)

## 2019-07-15 LAB — MAGNESIUM: Magnesium: 1.5 mg/dL — ABNORMAL LOW (ref 1.7–2.4)

## 2019-07-15 LAB — HEMOGLOBIN AND HEMATOCRIT, BLOOD
HCT: 20.1 % — ABNORMAL LOW (ref 36.0–46.0)
Hemoglobin: 6.7 g/dL — CL (ref 12.0–15.0)

## 2019-07-15 LAB — PROCALCITONIN: Procalcitonin: 1.89 ng/mL

## 2019-07-15 LAB — PHOSPHORUS: Phosphorus: 5.7 mg/dL — ABNORMAL HIGH (ref 2.5–4.6)

## 2019-07-15 LAB — ABO/RH: ABO/RH(D): O POS

## 2019-07-15 LAB — PREPARE RBC (CROSSMATCH)

## 2019-07-15 MED ORDER — SODIUM CHLORIDE 0.9 % IV BOLUS
500.0000 mL | Freq: Once | INTRAVENOUS | Status: AC
  Administered 2019-07-15: 500 mL via INTRAVENOUS

## 2019-07-15 MED ORDER — SODIUM CHLORIDE 0.9% IV SOLUTION: Freq: Once | INTRAVENOUS | Status: AC

## 2019-07-15 MED ORDER — MAGNESIUM SULFATE 2 GM/50ML IV SOLN
2.0000 g | Freq: Once | INTRAVENOUS | Status: AC
  Administered 2019-07-15: 2 g via INTRAVENOUS
  Filled 2019-07-15: qty 50

## 2019-07-15 NOTE — Progress Notes (Signed)
CRITICAL VALUE ALERT  Critical Value:  2.7   Date & Time Notified:  [phone], 0836, 1000 as well as chat.  Chat note seen by MD.   Provider Notified: Dr. Raphael Gibney, Attending  Orders Received/Actions taken: no orders received

## 2019-07-15 NOTE — Progress Notes (Signed)
CRITICAL VALUE ALERT  Critical Value:  H&H  Date & Time Notied:  Katherina Right, [facility] Hospitalist   Provider Notified: [phone], [phone] paged, Roni by Report  Orders Received/Actions taken: Dr. Katherina Right called back and is ordering 1 unit of blood. Roni aware.

## 2019-07-15 NOTE — Progress Notes (Signed)
   Subjective: 1 Day Post-Op Procedure(s) (LRB): EVACUATION HEMATOMA (Right) Patient reports pain as mild.   Patient seen in rounds by Dr. Charlann Boxer. Patient is well, and has had no acute complaints or problems other than pain in the right leg and knee.  We will start therapy today.   Objective: Vital signs in last 24 hours: Temp:  [97.8 F (36.6 C)-98.7 F (37.1 C)] 98.7 F (37.1 C) (04/28 0812) Pulse Rate:  [45-109] 97 (04/28 0812) Resp:  [11-27] 16 (04/28 0812) BP: (69-149)/(36-103) 149/103 (04/28 0812) SpO2:  [89 %-100 %] 99 % (04/28 0812)  Intake/Output from previous day:  Intake/Output Summary (Last 24 hours) at 07/15/2019 0833 Last data filed at 07/15/2019 0300 Gross per 24 hour  Intake 1876.17 ml  Output 100 ml  Net 1776.17 ml     Intake/Output this shift: No intake/output data recorded.  Labs: Recent Labs    07/13/19 1907 07/14/19 0332  HGB 11.4* 9.2*   Recent Labs    07/13/19 1907 07/14/19 0332  WBC 11.7* 14.7*  RBC 3.26* 2.58*  HCT 35.9* 28.9*  PLT 259 228   Recent Labs    07/14/19 0332 07/15/19 0623  NA 137 137  K 4.2 4.1  CL 99 95*  CO2 26 28  BUN 22 27*  CREATININE 1.47* 1.94*  GLUCOSE 175* 145*  CALCIUM 8.2* 7.8*   No results for input(s): LABPT, INR in the last 72 hours.  Exam: General - Patient is Alert Extremity - Neurologically intact Sensation intact distally Intact pulses distally Dorsiflexion/Plantar flexion intact Dressing - dressing C/D/I Motor Function - intact, moving foot and toes well on exam.   Past Medical History:  Diagnosis Date  . Arthritis   . Asthma   . CFS (chronic fatigue syndrome)   . Compression fracture of vertebral column (HCC) 01/16/2011   per CXR  . DDD (degenerative disc disease), lumbar 01/18/2016  . Diabetes mellitus   . Elevated cholesterol 01/18/2016  . Fibromyalgia 01/18/2016  . Fibromyositis   . Hashimoto's thyroiditis   . HTN (hypertension), benign   . Hypoglycemia associated with diabetes  (HCC) 01/16/2011  . Morbid obesity (HCC)   . Osteoarthritis of both knees 01/18/2016  . Pneumonia 12/2010  . Psoriasis 01/18/2016  . Psoriatic arthritis (HCC)   . Thyroid disease     Assessment/Plan: 1 Day Post-Op Procedure(s) (LRB): EVACUATION HEMATOMA (Right) Principal Problem:   Leg hematoma Active Problems:   DM type 2 (diabetes mellitus, type 2) (HCC)   HTN (hypertension), benign   Psoriatic arthritis (HCC)   Anemia   Asthma  Estimated body mass index is 42.23 kg/m as calculated from the following:   Height as of this encounter: 5\' 9"  (1.753 m).   Weight as of this encounter: 129.7 kg. Advance diet Up with therapy  DVT Prophylaxis - SCDs on left LE, foot pumps Weight bearing as tolerated.  Plan to get up with therapy today to begin working on gait training and ambulation. It is important to keep the right posterior calf as protected as possible during therapy and transfers. Her skin was found to be extremely fragile intra-operatively, and is at risk for further injury with minimal trauma. Keep dressing in place unless soiled. Medical management and discharge disposition per medicine team.   , PA-C Orthopedic Surgery [phone] 07/15/2019, 8:33 AM

## 2019-07-15 NOTE — Evaluation (Addendum)
Physical Therapy Evaluation Patient Details Name: Ashley Caldwell MRN: [id number] DOB: Nov 15, 1940 Today's Date: 07/15/2019   History of Present Illness  Pt is a 79 y.o. F who presents after hitting her right leg against her wheelchair with a right calf subcutaneous hematoma. Now s/p evacuation and complex wound primary closure 07/14/2019. Significant PMH of herpes ophthalmicus of right eye, a. fib, psoriatic arthritis, obesity, fibromyalgia.   Clinical Impression  Prior to admission, pt from SNF; questionable historian and uncertain of pt baseline. She states she "lifts herself to the wheelchair." Pt demonstrating anxious behaviors and fear of mobilizing during evaluation. Requiring moderate assist for bed mobility; further transfer deferred by pt at this time. These behaviors do not seem to be all pain related as pt also became very upset when she could not recall the month. Presents with RLE weakness, pain, decreased cognition, and balance deficits. Recommend return to SNF to address deficits and maximize functional mobility.      Follow Up Recommendations SNF    Equipment Recommendations  None recommended by PT    Recommendations for Other Services OT consult     Precautions / Restrictions Precautions Precautions: Fall Restrictions Weight Bearing Restrictions: Yes RLE Weight Bearing: Weight bearing as tolerated      Mobility  Bed Mobility Overal bed mobility: Needs Assistance Bed Mobility: Supine to Sit;Sit to Supine     Supine to sit: Mod assist Sit to supine: Mod assist   General bed mobility comments: Pt able to progress to long sitting position, assist for RLE management and bed pad to scoot hips forward. Assist for legs back into bed.  Transfers                 General transfer comment: deferred by pt  Ambulation/Gait                Stairs            Wheelchair Mobility    Modified Rankin (Stroke Patients Only)       Balance Overall  balance assessment: Needs assistance Sitting-balance support: Feet unsupported Sitting balance-Leahy Scale: Fair                                       Pertinent Vitals/Pain Pain Assessment: Faces Faces Pain Scale: Hurts even more Pain Location: RLE Pain Descriptors / Indicators: Crying;Sharp Pain Intervention(s): Limited activity within patient's tolerance;Monitored during session;Repositioned    Home Living Family/patient expects to be discharged to:: Skilled nursing facility                      Prior Function Level of Independence: Needs assistance   Gait / Transfers Assistance Needed: Pt stating she's "lifting herself to the wheelchair." Has been working with PT at [facility]  ADL's / Homemaking Assistance Needed: assist for BADL's        Hand Dominance        Extremity/Trunk Assessment   Upper Extremity Assessment Upper Extremity Assessment: Overall WFL for tasks assessed    Lower Extremity Assessment Lower Extremity Assessment: Generalized weakness;RLE deficits/detail RLE Deficits / Details: R posterior hematoma s/p evacuation. Ankle dorsiflexion/plantaflexion WFL. Hip/knee grossly 2/5    Cervical / Trunk Assessment Cervical / Trunk Assessment: Other exceptions Cervical / Trunk Exceptions: increased body habitus  Communication   Communication: No difficulties  Cognition Arousal/Alertness: Awake/alert Behavior During Therapy: Anxious Overall Cognitive Status: Impaired/Different from baseline  Area of Impairment: Attention;Orientation;Awareness;Problem solving                 Orientation Level: Disoriented to;Time Current Attention Level: Sustained       Awareness: Emergent Problem Solving: Slow processing;Decreased initiation;Difficulty sequencing;Requires verbal cues;Requires tactile cues General Comments: Pt A&Ox3 (not oriented to month). she demonstrates anxious behaviors and intermittently crying and gasping throughout session;  does not seem to be all pain related as she became very upset when she couldn't recall the month.       General Comments      Exercises General Exercises - Lower Extremity Ankle Circles/Pumps: Right;10 reps;Seated Long Arc Quad: Both;10 reps;Seated   Assessment/Plan    PT Assessment Patient needs continued PT services  PT Problem List Decreased strength;Decreased range of motion;Decreased activity tolerance;Decreased balance;Decreased mobility;Pain       PT Treatment Interventions DME instruction;Gait training;Functional mobility training;Therapeutic activities;Therapeutic exercise;Balance training;Patient/family education    PT Goals (Current goals can be found in the Care Plan section)  Acute Rehab PT Goals Patient Stated Goal: less pain PT Goal Formulation: With patient Time For Goal Achievement: 07/29/19 Potential to Achieve Goals: Good    Frequency Min 3X/week   Barriers to discharge        Co-evaluation               AM-PAC PT "6 Clicks" Mobility  Outcome Measure Help needed turning from your back to your side while in a flat bed without using bedrails?: A Little Help needed moving from lying on your back to sitting on the side of a flat bed without using bedrails?: A Lot Help needed moving to and from a bed to a chair (including a wheelchair)?: A Lot Help needed standing up from a chair using your arms (e.g., wheelchair or bedside chair)?: A Lot Help needed to walk in hospital room?: A Lot Help needed climbing 3-5 steps with a railing? : Total 6 Click Score: 12    End of Session   Activity Tolerance: Patient limited by pain Patient left: in bed;with call bell/phone within reach;with bed alarm set Nurse Communication: Mobility status PT Visit Diagnosis: Pain;Muscle weakness (generalized) (M62.81);Other abnormalities of gait and mobility (R26.89) Pain - Right/Left: Right Pain - part of body: Leg    Time: 3220-2542 PT Time Calculation (min) (ACUTE  ONLY): 25 min   Charges:   PT Evaluation $PT Eval Moderate Complexity: 1 Mod PT Treatments $Therapeutic Activity: 8-22 mins          Wyona Almas, PT, DPT Acute Rehabilitation Services Pager [phone] Office [phone]   Deno Etienne 07/15/2019, 1:34 PM

## 2019-07-15 NOTE — Progress Notes (Signed)
PROGRESS NOTE  Ashley Caldwell BPZ:[id number] DOB: [birth year] DOA: 07/13/2019 PCP: System, Pcp Not In  HPI/Recap of past 24 hours: Ashley Caldwell is a 79 y.o. 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 fib.  Presently on apixaban had hurt her leg after she fell onto her wheelchair about a week ago.  Since then she has developed a hematoma.  ED Course: In the ER patient had a CT scan which shows large hematoma of the right leg for which orthopedic surgeon Dr. Ihor Gully was consulted and plan to have clot removed 07/14/19.  Labs show hemoglobin of 11.4 platelets 259 creatinine 1.7 increased from baseline of around 1.3 blood glucose 119 WBC 11.7 Covid test was negative.  07/15/19: Seen and examined.  She reports pain in her right lower extremity prior to receiving her pain medication.    Assessment/Plan: Principal Problem:   Leg hematoma Active Problems:   DM type 2 (diabetes mellitus, type 2) (HCC)   HTN (hypertension), benign   Psoriatic arthritis (HCC)   Anemia   Asthma  Right leg hematoma secondary to trauma in the setting of anticoagulation with Eliquis post evacuation hematoma on 07/14/2019 by Dr. Alvan Dame POD #1 Continue pain control Care per orthopedic surgery's recommendations  Acute blood loss anemia post evacuation hematoma Hemoglobin dropped to 7.0 from 9.2 Repeat H&H Transfuse hemoglobin less than 7.0  Newly diagnosed A. Fib Recently diagnosed with A. fib started on Eliquis Rate controlled hold off Eliquis right lower extremity hematoma Continue to monitor on telemetry Eliquis on hold  AKI on CKD 3B Presented with creatinine 1.77 with GFR of 27 Baseline creatinine appears to be 1.4 with GFR 34 Creatinine trending up 1.94 Avoid nephrotoxins Closely monitor urine output Repeat BMP in the morning  Leukocytosis, unclear etiology Presented with WBC 11.7 trending up to 14.7 Obtain  urine analysis, urine culture Procalcitonin 1.89, lactic acid 2.7 No active disease on chest x-ray  Hypothyroidism Continue levothyroxine  Essential hypertension Continue home medications She is on Lopressor p.o. 25 mg twice daily  COPD Continue home medications Continue bronchodilators Maintain O2 saturation greater than 92%  History of COVID-19 viral infection Diagnosed on 04/22/2019  Chronic anxiety/depression Continue home medications  Recent herpes ophthalmicus of the right eye Appears stable  Chronic pain syndrome Continue home medication  Type 2 diabetes with hyperglycemia Obtain A1c Continue insulin sliding scale  Morbid obesity BMI 42 Recommend weight loss outpatient   Ambulatory dysfunction PT OT to assess Fall precautions  DVT prophylaxis: SCDs left LLE per orthopedics. Code Status: DNR. Family Communication: Discussed with patient.  Disposition Plan:  Patient is from SNF.  Anticipate discharge back to SNF in the next 24-48 hours or when orthopedic surgery signs off.    Consults called: Orthopedics.      Objective: Vitals:   07/14/19 2348 07/15/19 0334 07/15/19 0812 07/15/19 1333  BP: 91/78 (!) 88/53 (!) 149/103 107/81  Pulse: 62 (!) 55 97 89  Resp: 17 20 16 18   Temp: 97.8 F (36.6 C) 98.1 F (36.7 C) 98.7 F (37.1 C) 98.5 F (36.9 C)  TempSrc: Oral Oral    SpO2: 99% 93% 99% 99%  Weight:      Height:        Intake/Output Summary (Last 24 hours) at 07/15/2019 1613 Last data filed at 07/15/2019 1500 Gross per 24 hour  Intake 1395.42 ml  Output --  Net 1395.42  ml   Filed Weights   07/13/19 1439  Weight: 129.7 kg    Exam:  . General: 79 y.o. year-old female morbidly obese.  Alert and oriented x3. . Cardiovascular: Regular rate and rhythm no rubs or gallops. Marland Kitchen Respiratory: Clear to auscultation no wheezes or rales. . Abdomen: Obese nontender normal bowel sounds present.   . Musculoskeletal: Right lower extremity in surgical  dressings.    Data Reviewed: CBC: Recent Labs  Lab 07/13/19 1907 07/14/19 0332 07/15/19 0820  WBC 11.7* 14.7* 9.5  NEUTROABS 8.8*  --  5.5  HGB 11.4* 9.2* 7.0*  HCT 35.9* 28.9* 20.9*  MCV 110.1* 112.0* 106.6*  PLT 259 228 216   Basic Metabolic Panel: Recent Labs  Lab 07/13/19 1907 07/14/19 0332 07/15/19 0623  NA 142 137 137  K 3.6 4.2 4.1  CL 99 99 95*  CO2 23 26 28   GLUCOSE 119* 175* 145*  BUN 16 22 27*  CREATININE 1.77* 1.47* 1.94*  CALCIUM 8.9 8.2* 7.8*  MG  --   --  1.5*  PHOS  --   --  5.7*   GFR: Estimated Creatinine Clearance: 35.7 mL/min (A) (by C-G formula based on SCr of 1.94 mg/dL (H)). Liver Function Tests: Recent Labs  Lab 07/13/19 1907 07/15/19 0623  AST 104* 52*  ALT 50* 27  ALKPHOS 337* 189*  BILITOT 1.7* 0.9  PROT 5.5* 4.0*  ALBUMIN 2.9* 2.2*   No results for input(s): LIPASE, AMYLASE in the last 168 hours. No results for input(s): AMMONIA in the last 168 hours. Coagulation Profile: No results for input(s): INR, PROTIME in the last 168 hours. Cardiac Enzymes: No results for input(s): CKTOTAL, CKMB, CKMBINDEX, TROPONINI in the last 168 hours. BNP (last 3 results) No results for input(s): PROBNP in the last 8760 hours. HbA1C: No results for input(s): HGBA1C in the last 72 hours. CBG: Recent Labs  Lab 07/14/19 2010 07/14/19 2347 07/15/19 0334 07/15/19 0809 07/15/19 1244  GLUCAP 201* 174* 187* 93 194*   Lipid Profile: No results for input(s): CHOL, HDL, LDLCALC, TRIG, CHOLHDL, LDLDIRECT in the last 72 hours. Thyroid Function Tests: No results for input(s): TSH, T4TOTAL, FREET4, T3FREE, THYROIDAB in the last 72 hours. Anemia Panel: No results for input(s): VITAMINB12, FOLATE, FERRITIN, TIBC, IRON, RETICCTPCT in the last 72 hours. Urine analysis:    Component Value Date/Time   COLORURINE AMBER (A) 06/18/2019 0102   APPEARANCEUR HAZY (A) 06/18/2019 0102   LABSPEC 1.015 06/18/2019 0102   PHURINE 5.0 06/18/2019 0102   GLUCOSEU  NEGATIVE 06/18/2019 0102   HGBUR NEGATIVE 06/18/2019 0102   BILIRUBINUR NEGATIVE 06/18/2019 0102   KETONESUR NEGATIVE 06/18/2019 0102   PROTEINUR NEGATIVE 06/18/2019 0102   UROBILINOGEN 0.2 01/13/2011 1819   NITRITE NEGATIVE 06/18/2019 0102   LEUKOCYTESUR NEGATIVE 06/18/2019 0102   Sepsis Labs: @LABRCNTIP (procalcitonin:4,lacticidven:4)  ) Recent Results (from the past 240 hour(s))  Respiratory Panel by RT PCR (Flu A&B, Covid) - Nasopharyngeal Swab     Status: None   Collection Time: 07/13/19 10:11 PM   Specimen: Nasopharyngeal Swab  Result Value Ref Range Status   SARS Coronavirus 2 by RT PCR NEGATIVE NEGATIVE Final    Comment: (NOTE) SARS-CoV-2 target nucleic acids are NOT DETECTED. The SARS-CoV-2 RNA is generally detectable in upper respiratoy specimens during the acute phase of infection. The lowest concentration of SARS-CoV-2 viral copies this assay can detect is 131 copies/mL. A negative result does not preclude SARS-Cov-2 infection and should not be used as the sole basis for treatment  or other patient management decisions. A negative result may occur with  improper specimen collection/handling, submission of specimen other than nasopharyngeal swab, presence of viral mutation(s) within the areas targeted by this assay, and inadequate number of viral copies (<131 copies/mL). A negative result must be combined with clinical observations, patient history, and epidemiological information. The expected result is Negative. Fact Sheet for Patients:  https://www.moore.com/ Fact Sheet for Healthcare Providers:  https://www.young.biz/ This test is not yet ap proved or cleared by the [place] FDA and  has been authorized for detection and/or diagnosis of SARS-CoV-2 by FDA under an Emergency Use Authorization (EUA). This EUA will remain  in effect (meaning this test can be used) for the duration of the COVID-19 declaration under Section  564(b)(1) of the Act, 21 U.S.C. section 360bbb-3(b)(1), unless the authorization is terminated or revoked sooner.    Influenza A by PCR NEGATIVE NEGATIVE Final   Influenza B by PCR NEGATIVE NEGATIVE Final    Comment: (NOTE) The Xpert Xpress SARS-CoV-2/FLU/RSV assay is intended as an aid in  the diagnosis of influenza from Nasopharyngeal swab specimens and  should not be used as a sole basis for treatment. Nasal washings and  aspirates are unacceptable for Xpert Xpress SARS-CoV-2/FLU/RSV  testing. Fact Sheet for Patients: https://www.moore.com/ Fact Sheet for Healthcare Providers: https://www.young.biz/ This test is not yet approved or cleared by the [place] FDA and  has been authorized for detection and/or diagnosis of SARS-CoV-2 by  FDA under an Emergency Use Authorization (EUA). This EUA will remain  in effect (meaning this test can be used) for the duration of the  Covid-19 declaration under Section 564(b)(1) of the Act, 21  U.S.C. section 360bbb-3(b)(1), unless the authorization is  terminated or revoked. Performed at [facility], [address] [place], [place] [address]   MRSA PCR Screening     Status: Abnormal   Collection Time: 07/14/19 11:00 AM   Specimen: Nasal Mucosa; Nasopharyngeal  Result Value Ref Range Status   MRSA by PCR POSITIVE (A) NEGATIVE Final    Comment:        The GeneXpert MRSA Assay (FDA approved for NASAL specimens only), is one component of a comprehensive MRSA colonization surveillance program. It is not intended to diagnose MRSA infection nor to guide or monitor treatment for MRSA infections. RESULT CALLED TO, READ BACK BY AND VERIFIED WITH: London Pepper RN 15:45 07/14/19 ([email or web address]) Performed at [facility], [address], [place], [place] [address]       Studies: DG CHEST PORT 1 VIEW  Result Date: 07/15/2019 CLINICAL DATA:  Hypoxia.  Leukocytosis. EXAM: PORTABLE CHEST 1 VIEW  COMPARISON:  Chest x-ray dated June 17, 2019. FINDINGS: Stable cardiomediastinal silhouette. Chronically coarsened interstitial markings. No focal consolidation, pleural effusion, or pneumothorax. No acute osseous abnormality. Chronic right proximal humerus fracture. IMPRESSION: 1. No active disease. 2. Chronic interstitial changes. Electronically Signed   By: Obie Dredge M.D.   On: 07/15/2019 09:13    Scheduled Meds: . baclofen  5 mg Oral TID  . Chlorhexidine Gluconate Cloth  6 each Topical Q0600  . divalproex  125 mg Oral BID  . docusate sodium  100 mg Oral BID  . DULoxetine  120 mg Oral Daily  . [START ON 07/16/2019] fentaNYL  1 patch Transdermal Q72H  . ferrous sulfate  325 mg Oral BID WC  . insulin aspart  0-9 Units Subcutaneous Q4H  . levothyroxine  100 mcg Oral Q0600  . mometasone-formoterol  2 puff  Inhalation BID  . montelukast  10 mg Oral Q1400  . mupirocin ointment  1 application Nasal BID  . oxyCODONE  10 mg Oral BID  . polyethylene glycol  17 g Oral BID  . rOPINIRole  0.5 mg Oral QHS  . traZODone  100 mg Oral QHS    Continuous Infusions: . sodium chloride 75 mL/hr at 07/14/19 1624  . lactated ringers    . methocarbamol (ROBAXIN) IV       LOS: 2 days     Darlin Drop, MD [facility] Pager [phone]  If 7PM-7AM, please contact night-coverage [email or web address] Password [facility] 07/15/2019, 4:13 PM

## 2019-07-15 NOTE — Progress Notes (Signed)
RN in patient's room when notified change in heart rate by CCMD. Patient did not complain of chest pain or shortness of breath. Patient given night time meds and pain meds. Patient resting, continuous pulse ox connected, on 2L of oxygen. Focused assessment performed, patient alert and oriented x4. At this time MEWS immediately returned to green. Provider contacted, EKG ordered. Will continue to monitor.

## 2019-07-16 LAB — GLUCOSE, CAPILLARY
Glucose-Capillary: 286 mg/dL — ABNORMAL HIGH (ref 70–99)
Glucose-Capillary: 262 mg/dL — ABNORMAL HIGH (ref 70–99)
Glucose-Capillary: 204 mg/dL — ABNORMAL HIGH (ref 70–99)
Glucose-Capillary: 205 mg/dL — ABNORMAL HIGH (ref 70–99)
Glucose-Capillary: 272 mg/dL — ABNORMAL HIGH (ref 70–99)

## 2019-07-16 LAB — COMPREHENSIVE METABOLIC PANEL
ALT: 11 U/L (ref 0–44)
AST: 29 U/L (ref 15–41)
Albumin: 2.1 g/dL — ABNORMAL LOW (ref 3.5–5.0)
Alkaline Phosphatase: 163 U/L — ABNORMAL HIGH (ref 38–126)
Anion gap: 11 (ref 5–15)
BUN: 34 mg/dL — ABNORMAL HIGH (ref 8–23)
CO2: 27 mmol/L (ref 22–32)
Calcium: 7.7 mg/dL — ABNORMAL LOW (ref 8.9–10.3)
Chloride: 94 mmol/L — ABNORMAL LOW (ref 98–111)
Creatinine, Ser: 1.69 mg/dL — ABNORMAL HIGH (ref 0.44–1.00)
GFR calc Af Amer: 34 mL/min — ABNORMAL LOW (ref >60)
GFR calc non Af Amer: 29 mL/min — ABNORMAL LOW (ref >60)
Glucose, Bld: 238 mg/dL — ABNORMAL HIGH (ref 70–99)
Potassium: 4.2 mmol/L (ref 3.5–5.1)
Sodium: 132 mmol/L — ABNORMAL LOW (ref 135–145)
Total Bilirubin: 1.1 mg/dL (ref 0.3–1.2)
Total Protein: 4.3 g/dL — ABNORMAL LOW (ref 6.5–8.1)

## 2019-07-16 LAB — BPAM RBC
Blood Product Expiration Date: 202105292359
ISSUE DATE / TIME: 202104282332
Unit Type and Rh: 5100

## 2019-07-16 LAB — CBC WITH DIFFERENTIAL/PLATELET
Abs Immature Granulocytes: 0.06 10*3/uL (ref 0.00–0.07)
Basophils Absolute: 0 10*3/uL (ref 0.0–0.1)
Basophils Relative: 0 %
Eosinophils Absolute: 0 10*3/uL (ref 0.0–0.5)
Eosinophils Relative: 0 %
HCT: 21.9 % — ABNORMAL LOW (ref 36.0–46.0)
Hemoglobin: 7.4 g/dL — ABNORMAL LOW (ref 12.0–15.0)
Immature Granulocytes: 1 %
Lymphocytes Relative: 12 %
Lymphs Abs: 0.9 10*3/uL (ref 0.7–4.0)
MCH: 34.9 pg — ABNORMAL HIGH (ref 26.0–34.0)
MCHC: 33.8 g/dL (ref 30.0–36.0)
MCV: 103.3 fL — ABNORMAL HIGH (ref 80.0–100.0)
Monocytes Absolute: 1.1 10*3/uL — ABNORMAL HIGH (ref 0.1–1.0)
Monocytes Relative: 15 %
Neutro Abs: 5.5 10*3/uL (ref 1.7–7.7)
Neutrophils Relative %: 72 %
Platelets: 173 10*3/uL (ref 150–400)
RBC: 2.12 MIL/uL — ABNORMAL LOW (ref 3.87–5.11)
RDW: 19.2 % — ABNORMAL HIGH (ref 11.5–15.5)
WBC: 7.6 10*3/uL (ref 4.0–10.5)
nRBC: 0.3 % — ABNORMAL HIGH (ref 0.0–0.2)

## 2019-07-16 LAB — TYPE AND SCREEN
ABO/RH(D): O POS
Antibody Screen: NEGATIVE
Unit division: 0

## 2019-07-16 LAB — HEMOGLOBIN A1C
Hgb A1c MFr Bld: 5.3 % (ref 4.8–5.6)
Mean Plasma Glucose: 105.41 mg/dL

## 2019-07-16 NOTE — Progress Notes (Signed)
PROGRESS NOTE  Ashley Caldwell STM:[id number] DOB: 03-12-1941 DOA: 07/13/2019 PCP: System, Pcp Not In  HPI/Recap of past 24 hours: Ashley Caldwell is a 79 y.o. 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 fib and was started on Eliquis.  She fell onto her wheelchair about a week ago.  Since then she has developed a hematoma on her right leg.  ED Course: In the ER CT scan showed large hematoma of the right leg for which orthopedic surgeon Dr. Alvan Dame was consulted.  Post evacuation of hematoma on 07/14/2019 by Dr. Alvan Dame. _0  Hospital course complicated by acute blood loss, received 1 unit PRBC.  07/16/19: Seen and examined.  She feels a lot better today, she states.  Her pain is well controlled.    Assessment/Plan: Principal Problem:   Leg hematoma Active Problems:   DM type 2 (diabetes mellitus, type 2) (HCC)   HTN (hypertension), benign   Psoriatic arthritis (HCC)   Anemia   Asthma  Right leg hematoma secondary to trauma in the setting of anticoagulation with Eliquis post evacuation hematoma on 07/14/2019 by Dr. Alvan Dame POD #2 Continue pain control Care per orthopedic surgery's recommendations  Acute blood loss anemia post evacuation hematoma Hemoglobin dropped to 6.7 from 7.0 from 9.2 Transfuse 1 unit PRBC Hemoglobin 7.4 posttransfusion Continue to monitor H&H  Newly diagnosed A. Fib Recently diagnosed with A. fib started on Eliquis Rate controlled Hold off Eliquis due to right lower extremity hematoma Continue to monitor on telemetry  AKI on CKD 3B, improving Presented with creatinine 1.77 with GFR of 27 Baseline creatinine appears to be 1.4 with GFR 34 Creatinine trending down 1.94>> 1.69 Avoid nephrotoxins Closely monitor urine output Repeat BMP in the morning  Leukocytosis, unclear etiology Presented with WBC 11.7 trending up to 14.7 Obtain urine analysis, urine  culture Procalcitonin 1.89, lactic acid 2.7 No active disease on chest x-ray  Elevated LFTs, improving Unclear etiology Alk phos, AST trending down  Hypothyroidism Last TSH 1.0 (06/17/19) Continue levothyroxine  Essential hypertension Blood pressure is at goal Continue home medications She is on Lopressor p.o. 25 mg twice daily  COPD Continue home medications Continue bronchodilators Maintain O2 saturation greater than 92%  History of COVID-19 viral infection Diagnosed on 04/22/2019  Chronic anxiety/depression Continue home medications  Recent herpes ophthalmicus of the right eye Appears stable  Chronic pain syndrome Continue home medication  Type 2 diabetes with hyperglycemia Obtain A1c Continue insulin sliding scale  Morbid obesity BMI 42 Recommend weight loss outpatient   Ambulatory dysfunction PT OT recommends SNF  Continue PT OT with assistance of for precaution TOC for SNF placement.     DVT prophylaxis: SCDs left LLE per orthopedics. Code Status: DNR. Family Communication: Discussed with patient.  Disposition Plan:  Patient is from SNF.  Anticipate discharge back to SNF in the next 24-48 hours or when orthopedic surgery signs off.    Consults called: Orthopedics.      Objective: Vitals:   07/16/19 0240 07/16/19 0333 07/16/19 0814 07/16/19 1100  BP: (!) 112/56 126/86  125/77  Pulse: (!) 46 75  88  Resp: _1 Temp: 97.9 F (36.6 C) 97.8 F (36.6 C)  98.3 F (36.8 C)  TempSrc: Oral Oral  Oral  SpO2: 100% 98% 97% 98%  Weight:      Height:        Intake/Output Summary (Last  24 hours) at 07/16/2019 1425 Last data filed at 07/16/2019 0830 Gross per 24 hour  Intake 977.03 ml  Output --  Net 977.03 ml   Filed Weights   07/13/19 1439  Weight: 129.7 kg    Exam:  . General: 79 y.o. year-old female morbidly obese in no acute distress.  Alert and oriented x3. _0 . Cardiovascular: Regular rate and rhythm no rubs or gallops.    Marland Kitchen Respiratory: Clear to Auscultation No Wheezes or Rales. . Abdomen: Obese nontender normal bowel sounds present.   . Musculoskeletal: Right lower extremity in surgical dressing.  Data Reviewed: CBC: Recent Labs  Lab 07/13/19 1907 07/14/19 0332 07/15/19 0820 07/15/19 1822 07/16/19 0728  WBC 11.7* 14.7* 9.5  --  7.6  NEUTROABS 8.8*  --  5.5  --  5.5  HGB 11.4* 9.2* 7.0* 6.7* 7.4*  HCT 35.9* 28.9* 20.9* 20.1* 21.9*  MCV 110.1* 112.0* 106.6*  --  103.3*  PLT 259 228 216  --  355   Basic Metabolic Panel: Recent Labs  Lab 07/13/19 1907 07/14/19 0332 07/15/19 0623 07/16/19 0728  NA 142 137 137 132*  K 3.6 4.2 4.1 4.2  CL 99 99 95* 94*  CO2 _1 GLUCOSE 119* 175* 145* 238*  BUN 16 22 27* 34*  CREATININE 1.77* 1.47* 1.94* 1.69*  CALCIUM 8.9 8.2* 7.8* 7.7*  MG  --   --  1.5*  --   PHOS  --   --  5.7*  --    GFR: Estimated Creatinine Clearance: 41 mL/min (A) (by C-G formula based on SCr of 1.69 mg/dL (H)). Liver Function Tests: Recent Labs  Lab 07/13/19 1907 07/15/19 0623 07/16/19 0728  AST 104* 52* 29  ALT 50* 27 11  ALKPHOS 337* 189* 163*  BILITOT 1.7* 0.9 1.1  PROT 5.5* 4.0* 4.3*  ALBUMIN 2.9* 2.2* 2.1*   No results for input(s): LIPASE, AMYLASE in the last 168 hours. No results for input(s): AMMONIA in the last 168 hours. Coagulation Profile: No results for input(s): INR, PROTIME in the last 168 hours. Cardiac Enzymes: No results for input(s): CKTOTAL, CKMB, CKMBINDEX, TROPONINI in the last 168 hours. BNP (last 3 results) No results for input(s): PROBNP in the last 8760 hours. HbA1C: No results for input(s): HGBA1C in the last 72 hours. CBG: Recent Labs  Lab 07/15/19 1947 07/15/19 2332 07/16/19 0335 07/16/19 0740 07/16/19 1219  GLUCAP 211* 253* 286* 262* 204*   Lipid Profile: No results for input(s): CHOL, HDL, LDLCALC, TRIG, CHOLHDL, LDLDIRECT in the last 72 hours. Thyroid Function Tests: No results for input(s): TSH, T4TOTAL, FREET4,  T3FREE, THYROIDAB in the last 72 hours. Anemia Panel: No results for input(s): VITAMINB12, FOLATE, FERRITIN, TIBC, IRON, RETICCTPCT in the last 72 hours. Urine analysis:    Component Value Date/Time   COLORURINE AMBER (A) 06/18/2019 0102   APPEARANCEUR HAZY (A) 06/18/2019 0102   LABSPEC 1.015 06/18/2019 0102   PHURINE 5.0 06/18/2019 0102   GLUCOSEU NEGATIVE 06/18/2019 0102   HGBUR NEGATIVE 06/18/2019 0102   BILIRUBINUR NEGATIVE 06/18/2019 0102   KETONESUR NEGATIVE 06/18/2019 0102   PROTEINUR NEGATIVE 06/18/2019 0102   UROBILINOGEN 0.2 01/13/2011 1819   NITRITE NEGATIVE 06/18/2019 0102   LEUKOCYTESUR NEGATIVE 06/18/2019 0102   Sepsis Labs: _2 (procalcitonin:4,lacticidven:4)  ) Recent Results (from the past 240 hour(s))  Respiratory Panel by RT PCR (Flu A&B, Covid) - Nasopharyngeal Swab     Status: None   Collection Time: 07/13/19 10:11 PM   Specimen: Nasopharyngeal Swab  Result Value Ref Range Status   SARS Coronavirus 2 by RT PCR NEGATIVE NEGATIVE Final    Comment: (NOTE) SARS-CoV-2 target nucleic acids are NOT DETECTED. The SARS-CoV-2 RNA is generally detectable in upper respiratoy specimens during the acute phase of infection. The lowest concentration of SARS-CoV-2 viral copies this assay can detect is 131 copies/mL. A negative result does not preclude SARS-Cov-2 infection and should not be used as the sole basis for treatment or other patient management decisions. A negative result may occur with  improper specimen collection/handling, submission of specimen other than nasopharyngeal swab, presence of viral mutation(s) within the areas targeted by this assay, and inadequate number of viral copies (<131 copies/mL). A negative result must be combined with clinical observations, patient history, and epidemiological information. The expected result is Negative. Fact Sheet for Patients:  PinkCheek.be Fact Sheet for Healthcare Providers:   GravelBags.it This test is not yet ap proved or cleared by the [place] FDA and  has been authorized for detection and/or diagnosis of SARS-CoV-2 by FDA under an Emergency Use Authorization (EUA). This EUA will remain  in effect (meaning this test can be used) for the duration of the COVID-19 declaration under Section 564(b)(1) of the Act, 21 U.S.C. section 360bbb-3(b)(1), unless the authorization is terminated or revoked sooner.    Influenza A by PCR NEGATIVE NEGATIVE Final   Influenza B by PCR NEGATIVE NEGATIVE Final    Comment: (NOTE) The Xpert Xpress SARS-CoV-2/FLU/RSV assay is intended as an aid in  the diagnosis of influenza from Nasopharyngeal swab specimens and  should not be used as a sole basis for treatment. Nasal washings and  aspirates are unacceptable for Xpert Xpress SARS-CoV-2/FLU/RSV  testing. Fact Sheet for Patients: PinkCheek.be Fact Sheet for Healthcare Providers: GravelBags.it This test is not yet approved or cleared by the [place] FDA and  has been authorized for detection and/or diagnosis of SARS-CoV-2 by  FDA under an Emergency Use Authorization (EUA). This EUA will remain  in effect (meaning this test can be used) for the duration of the  Covid-19 declaration under Section 564(b)(1) of the Act, 21  U.S.C. section 360bbb-3(b)(1), unless the authorization is  terminated or revoked. Performed at [facility], [place] [address] [place], [place] [address]   MRSA PCR Screening     Status: Abnormal   Collection Time: 07/14/19 11:00 AM   Specimen: Nasal Mucosa; Nasopharyngeal  Result Value Ref Range Status   MRSA by PCR POSITIVE (A) NEGATIVE Final    Comment:        The GeneXpert MRSA Assay (FDA approved for NASAL specimens only), is one component of a comprehensive MRSA colonization surveillance program. It is not intended to diagnose MRSA infection nor to  guide or monitor treatment for MRSA infections. RESULT CALLED TO, READ BACK BY AND VERIFIED WITH: Sharlot Gowda RN 15:45 07/14/19 ([email or web address]) Performed at [facility], [place] [address] [place], [place] [address]       Studies: No results found.  Scheduled Meds: . baclofen  5 mg Oral TID  . Chlorhexidine Gluconate Cloth  6 each Topical Q0600  . divalproex  125 mg Oral BID  . docusate sodium  100 mg Oral BID  . DULoxetine  120 mg Oral Daily  . fentaNYL  1 patch Transdermal Q72H  . ferrous sulfate  325 mg Oral BID WC  . insulin aspart  0-9 Units Subcutaneous Q4H  . levothyroxine  100 mcg Oral Q0600  . mometasone-formoterol  2 puff  Inhalation BID  . montelukast  10 mg Oral Q1400  . mupirocin ointment  1 application Nasal BID  . oxyCODONE  10 mg Oral BID  . polyethylene glycol  17 g Oral BID  . rOPINIRole  0.5 mg Oral QHS  . traZODone  100 mg Oral QHS    Continuous Infusions: . sodium chloride 75 mL/hr at 07/16/19 0751  . lactated ringers Stopped (07/16/19 0751)  . methocarbamol (ROBAXIN) IV       LOS: 3 days     Kayleen Memos, MD [facility] Pager [phone]  If 7PM-7AM, please contact night-coverage [email or web address] Password [email or web address] 07/16/2019, 2:25 PM

## 2019-07-16 NOTE — Evaluation (Signed)
Occupational Therapy Evaluation Patient Details Name: Ashley Caldwell MRN: [id number] DOB: November 10, 1940 Today's Date: 07/16/2019    History of Present Illness Pt is a 79 y.o. F who presents after hitting her right leg against her wheelchair with a right calf subcutaneous hematoma. Now s/p evacuation and complex wound primary closure 07/14/2019. Significant PMH of herpes ophthalmicus of right eye, a. fib, psoriatic arthritis, obesity, fibromyalgia. h/o chronic pain     Clinical Impression   Pt admitted with above. She demonstrates the below listed deficits and will benefit from continued OT to maximize safety and independence with BADLs.  Pt presents to OT with generalized weakness, decreased activity tolerance, pain, impaired cognition.  She requires set up - total A for ADLs, and is currently able to perform transfers due to pain. She was able to move to EOB with min A this date.   She reports she has been a resident of SNF for ~ 2years, and was able to assist with some ADLs and transfer to a w/c.  Recommend return to SNF with continued therapies.       Follow Up Recommendations  SNF    Equipment Recommendations  None recommended by OT    Recommendations for Other Services       Precautions / Restrictions Precautions Precautions: Fall Restrictions Weight Bearing Restrictions: Yes RLE Weight Bearing: Weight bearing as tolerated      Mobility Bed Mobility Overal bed mobility: Needs Assistance Bed Mobility: Supine to Sit;Sit to Supine     Supine to sit: Min assist Sit to supine: Min assist   General bed mobility comments: Requires assist for Rt LE, and encouragement to perform as much as she was able   Transfers                 General transfer comment: Pt unable to move into standing due to severity of pain     Balance Overall balance assessment: Needs assistance Sitting-balance support: Feet supported Sitting balance-Leahy Scale: Fair Sitting balance - Comments:  Pt able to maintain EOB sitting with assist only to prop Rt LE as dependent position increases pain                                     ADL either performed or assessed with clinical judgement   ADL Overall ADL's : Needs assistance/impaired Eating/Feeding: Set up;Bed level   Grooming: Wash/dry face;Wash/dry hands;Oral care;Brushing hair;Minimal assistance;Bed level   Upper Body Bathing: Minimal assistance;Bed level   Lower Body Bathing: Maximal assistance;Bed level   Upper Body Dressing : Moderate assistance;Bed level;Sitting   Lower Body Dressing: Total assistance;Bed level   Toilet Transfer: Total assistance Toilet Transfer Details (indicate cue type and reason): did not attempt  Toileting- Clothing Manipulation and Hygiene: Total assistance;Bed level       Functional mobility during ADLs: Minimal assistance(bed mobility only )       Vision         Perception     Praxis      Pertinent Vitals/Pain Pain Assessment: Faces Faces Pain Scale: Hurts even more Pain Location: RLE Pain Descriptors / Indicators: Sharp;Grimacing;Guarding;Moaning Pain Intervention(s): Monitored during session;Repositioned     Hand Dominance Right   Extremity/Trunk Assessment Upper Extremity Assessment Upper Extremity Assessment: Generalized weakness   Lower Extremity Assessment Lower Extremity Assessment: Defer to PT evaluation   Cervical / Trunk Assessment Cervical / Trunk Assessment: Other exceptions Cervical / Trunk  Exceptions: increased body habitus   Communication Communication Communication: No difficulties   Cognition Arousal/Alertness: Awake/alert Behavior During Therapy: WFL for tasks assessed/performed;Anxious Overall Cognitive Status: No family/caregiver present to determine baseline cognitive functioning Area of Impairment: Attention;Memory;Problem solving;Awareness                 Orientation Level: Disoriented to;Time Current Attention Level:  Sustained;Selective Memory: Decreased short-term memory     Awareness: Intellectual Problem Solving: Slow processing;Difficulty sequencing;Requires verbal cues;Requires tactile cues General Comments: Pt is initially anxious with the prospect of moving, but when OT acknowledged her pain issues, and provided encouragement, pt was readily participatory.  She provided conflicting info re: PLOF, and when asked for clarification, she states "I've been so confused since I had COVID"    General Comments       Exercises     Shoulder Instructions      Home Living Family/patient expects to be discharged to:: Skilled nursing facility                                 Additional Comments: Pt has been a resident of [facility] for some time - pt reports 2 years, but is not always accurate with information       Prior Functioning/Environment Level of Independence: Needs assistance  Gait / Transfers Assistance Needed: Pt reports she has been transferring to the w/c and has been working on standing with PT at SNF.  Per chart review, pt has been w/c bound for several years  ADL's / Homemaking Assistance Needed: Pt requires assist for LB ADLs, and reports she is able to perform UB ADLs, but does need assist with fasteners, containers, and needs food cut up due to arthritis             OT Problem List: Decreased strength;Decreased activity tolerance;Impaired balance (sitting and/or standing);Decreased safety awareness;Decreased knowledge of use of DME or AE;Obesity;Pain      OT Treatment/Interventions: Self-care/ADL training;DME and/or AE instruction;Therapeutic activities;Patient/family education;Balance training;Cognitive remediation/compensation;Therapeutic exercise    OT Goals(Current goals can be found in the care plan section) Acute Rehab OT Goals Patient Stated Goal: to be able to get into the chair  OT Goal Formulation: With patient Time For Goal Achievement: 07/30/19 Potential  to Achieve Goals: Good ADL Goals Pt Will Perform Grooming: (P) with supervision;with set-up;sitting Pt Will Perform Upper Body Bathing: (P) with set-up;sitting;with supervision Pt Will Perform Lower Body Bathing: (P) with max assist;sit to/from stand Pt Will Perform Upper Body Dressing: (P) with set-up;sitting Pt Will Perform Lower Body Dressing: (P) with max assist;sit to/from stand Pt Will Transfer to Toilet: (P) with mod assist;stand pivot transfer;bedside commode Pt Will Perform Toileting - Clothing Manipulation and hygiene: (P) with mod assist;sit to/from stand  OT Frequency: Min 2X/week   Barriers to D/C: Decreased caregiver support  resident of SNF        Co-evaluation              AM-PAC OT "6 Clicks" Daily Activity     Outcome Measure Help from another person eating meals?: A Little Help from another person taking care of personal grooming?: A Little Help from another person toileting, which includes using toliet, bedpan, or urinal?: Total Help from another person bathing (including washing, rinsing, drying)?: A Lot Help from another person to put on and taking off regular upper body clothing?: A Lot Help from another person to put on and taking off  regular lower body clothing?: Total 6 Click Score: 12   End of Session    Activity Tolerance: Patient limited by pain Patient left: in bed;with call bell/phone within reach  OT Visit Diagnosis: Pain;Muscle weakness (generalized) (M62.81) Pain - Right/Left: Right Pain - part of body: Knee;Leg                Time: 7867-6720 OT Time Calculation (min): 28 min Charges:  OT General Charges $OT Visit: 1 Visit OT Evaluation $OT Eval Moderate Complexity: 1 Mod OT Treatments $Therapeutic Activity: 8-22 mins  Nilsa Nutting., OTR/L Acute Rehabilitation Services Pager [phone] Office [place], [place] 07/16/2019, 3:23 PM

## 2019-07-16 NOTE — Progress Notes (Signed)
Inpatient Diabetes Program Recommendations  AACE/ADA: New Consensus Statement on Inpatient Glycemic Control (2015)  Target Ranges:  Prepandial:   less than 140 mg/dL      Peak postprandial:   less than 180 mg/dL (1-2 hours)      Critically ill patients:  140 - 180 mg/dL   Lab Results  Component Value Date   GLUCAP 204 (H) 07/16/2019   HGBA1C 8.5 (H) 04/13/2016   Results for Ashley Caldwell, Ashley Caldwell (MRN [id number]) as of 07/16/2019 13:57  Ref. Range 07/15/2019 19:47 07/15/2019 23:32 07/16/2019 03:35 07/16/2019 07:40 07/16/2019 12:19  Glucose-Capillary Latest Ref Range: 70 - 99 mg/dL 888 (H) 757 (H) 972 (H) 262 (H) 204 (H)   Review of Glycemic Control  Diabetes history: type 2 Outpatient Diabetes medications: Levemir 25 units every am, 10 units every pm, Novolog 2-10 units TID SSI, Metformin 1000 mg daily Current orders for Inpatient glycemic control: Novolog SENSITIVE correction scale every 4 hours  Inpatient Diabetes Program Recommendations:   Noted that blood sugars have been greater than 200 mg/dl. Recommend increasing Novolog to MODERATE correction scale TID & HS scale if eating. Titrate dosages as needed.   Smith Mince RN BSN CDE Diabetes Coordinator Pager: [phone]  8am-5pm

## 2019-07-16 NOTE — Plan of Care (Signed)

## 2019-07-17 DIAGNOSIS — L8990 Pressure ulcer of unspecified site, unspecified stage: Secondary | ICD-10-CM | POA: Insufficient documentation

## 2019-07-17 LAB — GLUCOSE, CAPILLARY
Glucose-Capillary: 224 mg/dL — ABNORMAL HIGH (ref 70–99)
Glucose-Capillary: 197 mg/dL — ABNORMAL HIGH (ref 70–99)
Glucose-Capillary: 195 mg/dL — ABNORMAL HIGH (ref 70–99)
Glucose-Capillary: 219 mg/dL — ABNORMAL HIGH (ref 70–99)
Glucose-Capillary: 394 mg/dL — ABNORMAL HIGH (ref 70–99)

## 2019-07-17 LAB — COMPREHENSIVE METABOLIC PANEL
ALT: 9 U/L (ref 0–44)
AST: 25 U/L (ref 15–41)
Albumin: 2.4 g/dL — ABNORMAL LOW (ref 3.5–5.0)
Alkaline Phosphatase: 178 U/L — ABNORMAL HIGH (ref 38–126)
Anion gap: 10 (ref 5–15)
BUN: 32 mg/dL — ABNORMAL HIGH (ref 8–23)
CO2: 26 mmol/L (ref 22–32)
Calcium: 8.2 mg/dL — ABNORMAL LOW (ref 8.9–10.3)
Chloride: 95 mmol/L — ABNORMAL LOW (ref 98–111)
Creatinine, Ser: 1.36 mg/dL — ABNORMAL HIGH (ref 0.44–1.00)
GFR calc Af Amer: 44 mL/min — ABNORMAL LOW (ref >60)
GFR calc non Af Amer: 38 mL/min — ABNORMAL LOW (ref >60)
Glucose, Bld: 224 mg/dL — ABNORMAL HIGH (ref 70–99)
Potassium: 3.9 mmol/L (ref 3.5–5.1)
Sodium: 131 mmol/L — ABNORMAL LOW (ref 135–145)
Total Bilirubin: 1 mg/dL (ref 0.3–1.2)
Total Protein: 4.8 g/dL — ABNORMAL LOW (ref 6.5–8.1)

## 2019-07-17 LAB — CBC WITH DIFFERENTIAL/PLATELET
Abs Immature Granulocytes: 0.04 10*3/uL (ref 0.00–0.07)
Basophils Absolute: 0 10*3/uL (ref 0.0–0.1)
Basophils Relative: 0 %
Eosinophils Absolute: 0 10*3/uL (ref 0.0–0.5)
Eosinophils Relative: 0 %
HCT: 22.6 % — ABNORMAL LOW (ref 36.0–46.0)
Hemoglobin: 7.6 g/dL — ABNORMAL LOW (ref 12.0–15.0)
Immature Granulocytes: 0 %
Lymphocytes Relative: 13 %
Lymphs Abs: 1.2 10*3/uL (ref 0.7–4.0)
MCH: 34.7 pg — ABNORMAL HIGH (ref 26.0–34.0)
MCHC: 33.6 g/dL (ref 30.0–36.0)
MCV: 103.2 fL — ABNORMAL HIGH (ref 80.0–100.0)
Monocytes Absolute: 1.1 10*3/uL — ABNORMAL HIGH (ref 0.1–1.0)
Monocytes Relative: 12 %
Neutro Abs: 6.7 10*3/uL (ref 1.7–7.7)
Neutrophils Relative %: 75 %
Platelets: 214 10*3/uL (ref 150–400)
RBC: 2.19 MIL/uL — ABNORMAL LOW (ref 3.87–5.11)
RDW: 19.1 % — ABNORMAL HIGH (ref 11.5–15.5)
WBC: 9 10*3/uL (ref 4.0–10.5)
nRBC: 0 % (ref 0.0–0.2)

## 2019-07-17 MED ORDER — APIXABAN 5 MG PO TABS
5.0000 mg | ORAL_TABLET | Freq: Two times a day (BID) | ORAL | Status: DC
  Administered 2019-07-17 – 2019-07-18 (×2): 5 mg via ORAL
  Filled 2019-07-17 (×2): qty 1

## 2019-07-17 MED ORDER — INSULIN ASPART 100 UNIT/ML ~~LOC~~ SOLN
0.0000 [IU] | Freq: Three times a day (TID) | SUBCUTANEOUS | Status: DC
  Administered 2019-07-17: 3 [IU] via SUBCUTANEOUS
  Administered 2019-07-17: 5 [IU] via SUBCUTANEOUS
  Administered 2019-07-17 – 2019-07-18 (×4): 3 [IU] via SUBCUTANEOUS

## 2019-07-17 MED ORDER — INSULIN ASPART 100 UNIT/ML ~~LOC~~ SOLN
0.0000 [IU] | Freq: Every day | SUBCUTANEOUS | Status: DC
  Administered 2019-07-17: 5 [IU] via SUBCUTANEOUS

## 2019-07-17 NOTE — Progress Notes (Signed)
Physical Therapy Treatment Patient Details Name: Ashley Caldwell MRN: [id number] DOB: 09-29-1940 Today's Date: 07/17/2019    History of Present Illness Pt is a 79 y.o. F who presents after hitting her right leg against her wheelchair with a right calf subcutaneous hematoma. Now s/p evacuation and complex wound primary closure 07/14/2019. Significant PMH of herpes ophthalmicus of right eye, a. fib, psoriatic arthritis, obesity, fibromyalgia. h/o chronic pain      PT Comments    Pt is very anxious to move into standing and once sitting in recliner she became more anxious as she felt she could not get back to bed.  Informed patient if she is unable to get back to bed that we have lift equipment that can help.  Pt more calm post session with reassurance.  Continue to benefit from rehab in a post acute setting to improve strength and decrease caregiver burden before returning home.     Follow Up Recommendations  SNF     Equipment Recommendations  None recommended by PT    Recommendations for Other Services       Precautions / Restrictions Precautions Precautions: Fall Restrictions Weight Bearing Restrictions: Yes RLE Weight Bearing: Weight bearing as tolerated    Mobility  Bed Mobility Overal bed mobility: Needs Assistance Bed Mobility: Supine to Sit;Sit to Supine     Supine to sit: Min assist     General bed mobility comments: Required assistance for B LEs and trunk elevation.  Pt very motivated with heavy use of bed rails.  Transfers Overall transfer level: Needs assistance Equipment used: Ambulation equipment used;Rolling walker (2 wheeled)(attempted with RW, patient unable required sara stedy for additional sit to stand.) Transfers: Sit to/from Stand Sit to Stand: Mod assist;+2 physical assistance;From elevated surface         General transfer comment: Pt with decreased pain and able to stand x2 in sara stedy with bed height elevated.  Pt required assistance to boost  into standing.  Poor eccentric load noted when returning to a seated position.  Ambulation/Gait Ambulation/Gait assistance: (NT)               Stairs             Wheelchair Mobility    Modified Rankin (Stroke Patients Only)       Balance Overall balance assessment: Needs assistance Sitting-balance support: Feet supported Sitting balance-Leahy Scale: Fair Sitting balance - Comments: Pt able to maintain EOB sitting with out assistance.     Standing balance-Leahy Scale: Poor Standing balance comment: heavy reliance on B UEs and external assistance.                            Cognition Arousal/Alertness: Awake/alert Behavior During Therapy: WFL for tasks assessed/performed;Anxious Overall Cognitive Status: No family/caregiver present to determine baseline cognitive functioning Area of Impairment: Attention;Memory;Problem solving;Awareness                   Current Attention Level: Sustained;Selective Memory: Decreased short-term memory     Awareness: Intellectual Problem Solving: Slow processing;Difficulty sequencing;Requires verbal cues;Requires tactile cues General Comments: Pt very anxious.  Began crying when she moved to the recliner.  " I will never get out of this chair.  I'm stuck I just know it."      Exercises General Exercises - Lower Extremity Ankle Circles/Pumps: AROM;Both;20 reps;Supine Quad Sets: AROM;Both;5 reps;Supine    General Comments        Pertinent  Vitals/Pain Pain Assessment: Faces Faces Pain Scale: Hurts even more Pain Location: RLE Pain Descriptors / Indicators: Sharp;Grimacing;Guarding;Moaning Pain Intervention(s): Monitored during session;Repositioned    Home Living                      Prior Function            PT Goals (current goals can now be found in the care plan section) Acute Rehab PT Goals Patient Stated Goal: to be able to get into the chair  PT Goal Formulation: With  patient Potential to Achieve Goals: Good Progress towards PT goals: Progressing toward goals    Frequency    Min 3X/week      PT Plan Current plan remains appropriate    Co-evaluation              AM-PAC PT "6 Clicks" Mobility   Outcome Measure  Help needed turning from your back to your side while in a flat bed without using bedrails?: A Little Help needed moving from lying on your back to sitting on the side of a flat bed without using bedrails?: A Little Help needed moving to and from a bed to a chair (including a wheelchair)?: A Lot(with bed height very elevated) Help needed standing up from a chair using your arms (e.g., wheelchair or bedside chair)?: Total Help needed to walk in hospital room?: Total Help needed climbing 3-5 steps with a railing? : Total 6 Click Score: 11    End of Session Equipment Utilized During Treatment: Gait belt Activity Tolerance: Patient limited by pain Patient left: with call bell/phone within reach;in chair;with chair alarm set Nurse Communication: Mobility status PT Visit Diagnosis: Pain;Muscle weakness (generalized) (M62.81);Other abnormalities of gait and mobility (R26.89) Pain - Right/Left: Right Pain - part of body: Leg     Time: 5027-7412 PT Time Calculation (min) (ACUTE ONLY): 26 min  Charges:  $Therapeutic Activity: 23-37 mins                     Erasmo Leventhal , PTA Acute Rehabilitation Services Pager [phone] Office [phone]     Lacreshia Bondarenko Eli Hose 07/17/2019, 4:11 PM

## 2019-07-17 NOTE — Progress Notes (Signed)
Patient ID: Ashley Caldwell, female   DOB: 1940-04-25, 79 y.o.   MRN: [id number] Subjective: 3 Days Post-Op Procedure(s) (LRB): EVACUATION HEMATOMA (Right)    Patient resting comfortably this am.  No reported events.  Objective:   VITALS:   Vitals:   07/16/19 2158 07/17/19 0500  BP: (!) 107/94 (!) 102/45  Pulse: 69 98  Resp: 16 18  Temp: 98.3 F (36.8 C) 98.8 F (37.1 C)  SpO2: 96% 96%    Incision: dressing C/D/I  Surgical dressing remains in place due to the significantly thin skin identified intra-op  LABS Recent Labs    07/15/19 0820 07/15/19 0820 07/15/19 1822 07/16/19 0728 07/17/19 0244  HGB 7.0*   < > 6.7* 7.4* 7.6*  HCT 20.9*   < > 20.1* 21.9* 22.6*  WBC 9.5  --   --  7.6 9.0  PLT 216  --   --  173 214   < > = values in this interval not displayed.    Recent Labs    07/15/19 0623 07/16/19 0728 07/17/19 0244  NA 137 132* 131*  K 4.1 4.2 3.9  BUN 27* 34* 32*  CREATININE 1.94* 1.69* 1.36*  GLUCOSE 145* 238* 224*    No results for input(s): LABPT, INR in the last 72 hours.   Assessment/Plan: 3 Days Post-Op Procedure(s) (LRB): EVACUATION HEMATOMA (Right)  Plan: The management of her leg wound will be a challenge I would recommend keeping the surgical dressing in place until follow up in 2 weeks WBAT BLE Decubitus precautions

## 2019-07-17 NOTE — Plan of Care (Signed)
  Problem: Education: Goal: Knowledge of General Education information will improve Description: Including pain rating scale, medication(s)/side effects and non-pharmacologic comfort measures Outcome: Progressing   Problem: Health Behavior/Discharge Planning: Goal: Ability to manage health-related needs will improve Outcome: Progressing   Problem: Activity: Goal: Risk for activity intolerance will decrease Outcome: Progressing   Problem: Elimination: Goal: Will not experience complications related to bowel motility Outcome: Progressing   Problem: Pain Managment: Goal: General experience of comfort will improve Outcome: Progressing   Problem: Safety: Goal: Ability to remain free from injury will improve Outcome: Progressing   Problem: Skin Integrity: Goal: Risk for impaired skin integrity will decrease Outcome: Progressing   

## 2019-07-17 NOTE — Plan of Care (Signed)
  Problem: Education: Goal: Knowledge of General Education information will improve Description Including pain rating scale, medication(s)/side effects and non-pharmacologic comfort measures Outcome: Progressing   

## 2019-07-17 NOTE — Progress Notes (Addendum)
PROGRESS NOTE  Ashley Caldwell HYQ:[id number] DOB: [birth year] DOA: 07/13/2019 PCP: System, Pcp Not In  HPI/Recap of past 24 hours: Ashley Caldwell is a 79 y.o. 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 fib and was started on Eliquis.  She fell onto her wheelchair about a week ago.  Since then she has developed a hematoma on her right leg.  ED Course: In the ER CT scan showed large hematoma of the right leg for which orthopedic surgeon Dr. Charlann Boxer was consulted.  Post evacuation of hematoma on 07/14/2019 by Dr. Charlann Boxer.   Hospital course complicated by acute blood loss, received 1 unit PRBC.  07/17/19:  Seen and examined at her bedside.  No acute events overnight.  States she feels well this morning.  She has no new complaints.  Her pain is well controlled.  Seen by orthopedic surgery, Dr. Charlann Boxer, recommends to keep the surgical dressing in place until follow-up in 2 weeks.  Weightbearing as tolerated to bilateral lower extremity with decubitus precautions.  Appreciate orthopedic surgery's assistance.   Assessment/Plan: Principal Problem:   Leg hematoma Active Problems:   DM type 2 (diabetes mellitus, type 2) (HCC)   HTN (hypertension), benign   Psoriatic arthritis (HCC)   Anemia   Asthma   Pressure injury of skin  Right leg hematoma secondary to trauma in the setting of anticoagulation with Eliquis post evacuation hematoma on 07/14/2019 by Dr. Charlann Boxer POD #3 Continue pain control Seen by orthopedic surgery, Dr. Charlann Boxer, recommends to keep the surgical dressing in place until follow-up in 2 weeks.  Weightbearing as tolerated to bilateral lower extremity with decubitus precautions.   Appreciate orthopedic surgery's assistance. Defer to orthopedic surgery to restart oral anticoagulation, Eliquis.  Acute blood loss anemia post evacuation hematoma Hemoglobin dropped to 6.7 from 7.0 from 9.2 Transfused 1 unit  PRBC 4/29 Hemoglobin 7.4 posttransfusion Hemoglobin slowly trending up 7.6.  Newly diagnosed A. Fib Recently diagnosed with A. fib started on Eliquis Currently rate controlled Held off Eliquis due to right lower extremity hematoma Defer to orthopedic surgery to restart oral anticoagulation, Eliquis. Discussed with Dr. Charlann Boxer, ok to restart DOAC, Eliquis  AKI on CKD 3B, improving Presented with creatinine 1.77 with GFR of 27 Baseline creatinine appears to be 1.3 with GFR of 38 Creatinine trending down 1.94>> 1.69>> 1.36 Continue to Avoid nephrotoxins and dehydration Continue IV fluid normal saline at 50 cc/h Continue to monitor urine output  Resolved leukocytosis, likely reactive in the setting of trauma Presented with WBC 11.7 trending up to 14.7, now resolved. Urine analysis negative, chest x-ray no active disease.  Isolated elevated alkaline phosphatase Unclear etiology AST, ALT, T bili normal Elevated alkaline phosphatase, 178.  Hypothyroidism Last TSH 1.0 (06/17/19) Continue levothyroxine  Essential hypertension Blood pressure is at goal Continue home medications She is on Lopressor p.o. 25 mg twice daily  COPD Continue home medications Continue bronchodilators Maintain O2 saturation greater than 92%  History of COVID-19 viral infection Diagnosed on 04/22/2019  Chronic anxiety/depression Continue home medications  Recent herpes ophthalmicus of the right eye Appears stable  Chronic pain syndrome Continue home medication  Type 2 diabetes with hyperglycemia likely exacerbated by her trauma/wound to her right lower extremity. Hemoglobin A1c 5.3. Continue insulin sliding scale for CBGs greater than 200.  Morbid obesity BMI 42 Recommend weight loss outpatient   Ambulatory dysfunction PT OT recommends SNF  Continue PT OT  with assistance of for precaution TOC for SNF placement.   High risk for developping decubitus ulcer Apply Decubitus precautions, continue  at SNF per ortho.  Stage 2 bilateral buttocks pressure injury, POA Decubitus precautions   DVT prophylaxis: SCDs left LLE per orthopedics. Code Status: DNR. Family Communication: Discussed with patient.  Disposition Plan:  Patient is from SNF.  Anticipate discharge back to SNF in the next 24-48 hours or when orthopedic surgery signs off.    Consults called: Orthopedics.      Objective: Vitals:   07/16/19 1933 07/16/19 2158 07/17/19 0500 07/17/19 0817  BP:  (!) 107/94 (!) 102/45 (!) 115/55  Pulse:  69 98 93  Resp:  16 18 17   Temp:  98.3 F (36.8 C) 98.8 F (37.1 C) 97.7 F (36.5 C)  TempSrc:   Oral Oral  SpO2: 97% 96% 96% 100%  Weight:      Height:        Intake/Output Summary (Last 24 hours) at 07/17/2019 1144 Last data filed at 07/17/2019 0900 Gross per 24 hour  Intake 420 ml  Output 125 ml  Net 295 ml   Filed Weights   07/13/19 1439  Weight: 129.7 kg    Exam:  . General: 79 y.o. year-old female morbidly obese in no acute distress.  Alert and interactive.  . Cardiovascular: Regular rate and rhythm no rubs or gallops. Marland Kitchen Respiratory: Clear to auscultation no wheezes no rales.   . Abdomen: Obese nontender normal bowel sounds present.   . Musculoskeletal: Right lower extremity in surgical dressing.  Trace edema in lower extremities bilaterally.  Data Reviewed: CBC: Recent Labs  Lab 07/13/19 1907 07/13/19 1907 07/14/19 0332 07/15/19 0820 07/15/19 1822 07/16/19 0728 07/17/19 0244  WBC 11.7*  --  14.7* 9.5  --  7.6 9.0  NEUTROABS 8.8*  --   --  5.5  --  5.5 6.7  HGB 11.4*   < > 9.2* 7.0* 6.7* 7.4* 7.6*  HCT 35.9*   < > 28.9* 20.9* 20.1* 21.9* 22.6*  MCV 110.1*  --  112.0* 106.6*  --  103.3* 103.2*  PLT 259  --  228 216  --  173 214   < > = values in this interval not displayed.   Basic Metabolic Panel: Recent Labs  Lab 07/13/19 1907 07/14/19 0332 07/15/19 0623 07/16/19 0728 07/17/19 0244  NA 142 137 137 132* 131*  K 3.6 4.2 4.1 4.2 3.9  CL 99  99 95* 94* 95*  CO2 23 26 28 27 26   GLUCOSE 119* 175* 145* 238* 224*  BUN 16 22 27* 34* 32*  CREATININE 1.77* 1.47* 1.94* 1.69* 1.36*  CALCIUM 8.9 8.2* 7.8* 7.7* 8.2*  MG  --   --  1.5*  --   --   PHOS  --   --  5.7*  --   --    GFR: Estimated Creatinine Clearance: 50.9 mL/min (A) (by C-G formula based on SCr of 1.36 mg/dL (H)). Liver Function Tests: Recent Labs  Lab 07/13/19 1907 07/15/19 0623 07/16/19 0728 07/17/19 0244  AST 104* 52* 29 25  ALT 50* 27 11 9   ALKPHOS 337* 189* 163* 178*  BILITOT 1.7* 0.9 1.1 1.0  PROT 5.5* 4.0* 4.3* 4.8*  ALBUMIN 2.9* 2.2* 2.1* 2.4*   No results for input(s): LIPASE, AMYLASE in the last 168 hours. No results for input(s): AMMONIA in the last 168 hours. Coagulation Profile: No results for input(s): INR, PROTIME in the last 168 hours. Cardiac Enzymes: No  results for input(s): CKTOTAL, CKMB, CKMBINDEX, TROPONINI in the last 168 hours. BNP (last 3 results) No results for input(s): PROBNP in the last 8760 hours. HbA1C: Recent Labs    07/16/19 1655  HGBA1C 5.3   CBG: Recent Labs  Lab 07/16/19 1219 07/16/19 1616 07/16/19 2029 07/17/19 0459 07/17/19 0825  GLUCAP 204* 205* 272* 224* 197*   Lipid Profile: No results for input(s): CHOL, HDL, LDLCALC, TRIG, CHOLHDL, LDLDIRECT in the last 72 hours. Thyroid Function Tests: No results for input(s): TSH, T4TOTAL, FREET4, T3FREE, THYROIDAB in the last 72 hours. Anemia Panel: No results for input(s): VITAMINB12, FOLATE, FERRITIN, TIBC, IRON, RETICCTPCT in the last 72 hours. Urine analysis:    Component Value Date/Time   COLORURINE AMBER (A) 06/18/2019 0102   APPEARANCEUR HAZY (A) 06/18/2019 0102   LABSPEC 1.015 06/18/2019 0102   PHURINE 5.0 06/18/2019 0102   GLUCOSEU NEGATIVE 06/18/2019 0102   HGBUR NEGATIVE 06/18/2019 0102   BILIRUBINUR NEGATIVE 06/18/2019 0102   KETONESUR NEGATIVE 06/18/2019 0102   PROTEINUR NEGATIVE 06/18/2019 0102   UROBILINOGEN 0.2 01/13/2011 1819   NITRITE  NEGATIVE 06/18/2019 0102   LEUKOCYTESUR NEGATIVE 06/18/2019 0102   Sepsis Labs: @LABRCNTIP (procalcitonin:4,lacticidven:4)  ) Recent Results (from the past 240 hour(s))  Respiratory Panel by RT PCR (Flu A&B, Covid) - Nasopharyngeal Swab     Status: None   Collection Time: 07/13/19 10:11 PM   Specimen: Nasopharyngeal Swab  Result Value Ref Range Status   SARS Coronavirus 2 by RT PCR NEGATIVE NEGATIVE Final    Comment: (NOTE) SARS-CoV-2 target nucleic acids are NOT DETECTED. The SARS-CoV-2 RNA is generally detectable in upper respiratoy specimens during the acute phase of infection. The lowest concentration of SARS-CoV-2 viral copies this assay can detect is 131 copies/mL. A negative result does not preclude SARS-Cov-2 infection and should not be used as the sole basis for treatment or other patient management decisions. A negative result may occur with  improper specimen collection/handling, submission of specimen other than nasopharyngeal swab, presence of viral mutation(s) within the areas targeted by this assay, and inadequate number of viral copies (<131 copies/mL). A negative result must be combined with clinical observations, patient history, and epidemiological information. The expected result is Negative. Fact Sheet for Patients:  07/15/19 Fact Sheet for Healthcare Providers:  https://www.moore.com/ This test is not yet ap proved or cleared by the https://www.young.biz/ FDA and  has been authorized for detection and/or diagnosis of SARS-CoV-2 by FDA under an Emergency Use Authorization (EUA). This EUA will remain  in effect (meaning this test can be used) for the duration of the COVID-19 declaration under Section 564(b)(1) of the Act, 21 U.S.C. section 360bbb-3(b)(1), unless the authorization is terminated or revoked sooner.    Influenza A by PCR NEGATIVE NEGATIVE Final   Influenza B by PCR NEGATIVE NEGATIVE Final    Comment:  (NOTE) The Xpert Xpress SARS-CoV-2/FLU/RSV assay is intended as an aid in  the diagnosis of influenza from Nasopharyngeal swab specimens and  should not be used as a sole basis for treatment. Nasal washings and  aspirates are unacceptable for Xpert Xpress SARS-CoV-2/FLU/RSV  testing. Fact Sheet for Patients: [place] Fact Sheet for Healthcare Providers: https://www.moore.com/ This test is not yet approved or cleared by the https://www.young.biz/ FDA and  has been authorized for detection and/or diagnosis of SARS-CoV-2 by  FDA under an Emergency Use Authorization (EUA). This EUA will remain  in effect (meaning this test can be used) for the duration of the  Covid-19 declaration under Section  564(b)(1) of the Act, 21  U.S.C. section 360bbb-3(b)(1), unless the authorization is  terminated or revoked. Performed at [facility], [address] [place], [place] [address]   MRSA PCR Screening     Status: Abnormal   Collection Time: 07/14/19 11:00 AM   Specimen: Nasal Mucosa; Nasopharyngeal  Result Value Ref Range Status   MRSA by PCR POSITIVE (A) NEGATIVE Final    Comment:        The GeneXpert MRSA Assay (FDA approved for NASAL specimens only), is one component of a comprehensive MRSA colonization surveillance program. It is not intended to diagnose MRSA infection nor to guide or monitor treatment for MRSA infections. RESULT CALLED TO, READ BACK BY AND VERIFIED WITH: London Pepper RN 15:45 07/14/19 ([email or web address]) Performed at [facility], [address], [place], [place] [address]       Studies: No results found.  Scheduled Meds: . baclofen  5 mg Oral TID  . Chlorhexidine Gluconate Cloth  6 each Topical Q0600  . divalproex  125 mg Oral BID  . docusate sodium  100 mg Oral BID  . DULoxetine  120 mg Oral Daily  . fentaNYL  1 patch Transdermal Q72H  . ferrous sulfate  325 mg Oral BID WC  . insulin aspart  0-15 Units  Subcutaneous TID WC  . insulin aspart  0-5 Units Subcutaneous QHS  . levothyroxine  100 mcg Oral Q0600  . mometasone-formoterol  2 puff Inhalation BID  . montelukast  10 mg Oral Q1400  . mupirocin ointment  1 application Nasal BID  . oxyCODONE  10 mg Oral BID  . polyethylene glycol  17 g Oral BID  . rOPINIRole  0.5 mg Oral QHS  . traZODone  100 mg Oral QHS    Continuous Infusions: . sodium chloride 75 mL/hr at 07/16/19 1622  . lactated ringers Stopped (07/16/19 0751)  . methocarbamol (ROBAXIN) IV       LOS: 4 days     Darlin Drop, MD [facility] Pager [phone]  If 7PM-7AM, please contact night-coverage [email or web address] Password [facility] 07/17/2019, 11:44 AM

## 2019-07-17 NOTE — Plan of Care (Signed)
  Problem: Education: Goal: Knowledge of General Education information will improve Description: Including pain rating scale, medication(s)/side effects and non-pharmacologic comfort measures Outcome: Progressing   Problem: Health Behavior/Discharge Planning: Goal: Ability to manage health-related needs will improve Outcome: Progressing   Problem: Activity: Goal: Risk for activity intolerance will decrease Outcome: Progressing   Problem: Nutrition: Goal: Adequate nutrition will be maintained Outcome: Progressing   Problem: Elimination: Goal: Will not experience complications related to bowel motility Outcome: Progressing   Problem: Education: Goal: Knowledge of General Education information will improve Description: Including pain rating scale, medication(s)/side effects and non-pharmacologic comfort measures Outcome: Progressing   Problem: Health Behavior/Discharge Planning: Goal: Ability to manage health-related needs will improve Outcome: Progressing   Problem: Activity: Goal: Risk for activity intolerance will decrease Outcome: Progressing   Problem: Nutrition: Goal: Adequate nutrition will be maintained Outcome: Progressing   Problem: Elimination: Goal: Will not experience complications related to bowel motility Outcome: Progressing

## 2019-07-17 NOTE — [place] FL2 (Signed)
[place] MEDICAID FL2 LEVEL OF CARE SCREENING TOOL     IDENTIFICATION  Patient Name: Ashley Caldwell Birthdate: Sep 13, 1940 Sex: female Admission Date (Current Location): 07/13/2019  [facility] and [place] Number:  Producer, television/film/video and Address:  [facility], [address] [place], [place] [address]      Provider Number: [id number]  Attending Physician Name and Address:  Darlin Drop, DO  Relative Name and Phone Number:  Zehra Rucci    Current Level of Care: Hospital Recommended Level of Care: Skilled Nursing Facility Prior Approval Number:    Date Approved/Denied:   PASRR Number:    Discharge Plan: SNF    Current Diagnoses: Patient Active Problem List   Diagnosis Date Noted  . Pressure injury of skin 07/17/2019  . Leg hematoma 07/13/2019  . Elevated LFTs 04/23/2019  . Atrial fibrillation (HCC) 04/23/2019  . Chronic pain syndrome 04/23/2019  . Opioid dependence (HCC) 04/23/2019  . Constipation due to opioid therapy 04/23/2019  . 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  . 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  . High risk medication use 06/12/2016  . Closed fracture of right proximal humerus 05/01/2016  . History of renal failure 04/26/2016  . Primary osteoarthritis of both knees 04/26/2016  . Renal failure 04/13/2016  . Acute renal failure (HCC) 04/13/2016  . Psoriasis 01/18/2016  . Fibromyalgia 01/18/2016  . DDD (degenerative disc disease), lumbar 01/18/2016  . Osteoarthritis of both knees 01/18/2016  . Elevated cholesterol 01/18/2016  . Hypoglycemia associated with diabetes (HCC) 01/16/2011  . Compression fracture of vertebral column (HCC) 01/16/2011  . Hyponatremia 01/14/2011  . Pneumonia 01/13/2011  . Hyperglycemia 01/13/2011  . Weakness generalized 01/13/2011  . DM type 2 (diabetes mellitus, type 2) (HCC) 01/13/2011  . HTN (hypertension), benign 01/13/2011  . Psoriatic arthritis (HCC) 01/13/2011  . Anemia  01/13/2011  . Thyroiditis, chronic 01/13/2011  . Morbid obesity (HCC) 01/13/2011  . Asthma 01/13/2011    Orientation RESPIRATION BLADDER Height & Weight     Self, Time, Place, Situation  O2(Nasal Cannula) External catheter, Incontinent Weight: 286 lb (129.7 kg) Height:  5\' 9"  (175.3 cm)  BEHAVIORAL SYMPTOMS/MOOD NEUROLOGICAL BOWEL NUTRITION STATUS      Continent Diet(See discharge summary)  AMBULATORY STATUS COMMUNICATION OF NEEDS Skin   Extensive Assist Verbally Normal, Other (Comment)(Pressure injury on bottocks)                       Personal Care Assistance Level of Assistance  Bathing, Dressing, Feeding Bathing Assistance: Maximum assistance Feeding assistance: Limited assistance Dressing Assistance: Maximum assistance     Functional Limitations Info  Sight, Speech, Hearing Sight Info: Adequate Hearing Info: Adequate Speech Info: Adequate    SPECIAL CARE FACTORS FREQUENCY  PT (By licensed PT), OT (By licensed OT)     PT Frequency: 5x a week OT Frequency: 5x a week            Contractures      Additional Factors Info  Code Status, Allergies Code Status Info: DNR Allergies Info: Lyrica (pregabalin); Amoxicillin-pot Clavulanate; Latex           Current Medications (07/17/2019):  This is the current hospital active medication list Current Facility-Administered Medications  Medication Dose Route Frequency Provider Last Rate Last Admin  . 0.9 %  sodium chloride infusion   Intravenous Continuous 07/19/2019, DO 75 mL/hr at 07/16/19 1622 Restarted at 07/16/19 1622  . albuterol (PROVENTIL) (2.5 MG/3ML) 0.083% nebulizer  solution 2.5 mg  2.5 mg Inhalation Q4H PRN Tommie Ard, PA-C      . alum & mag hydroxide-simeth (MAALOX/MYLANTA) 200-200-20 MG/5ML suspension 15 mL  15 mL Oral Q4H PRN Tommie Ard, PA-C      . baclofen (LIORESAL) tablet 5 mg  5 mg Oral TID Tommie Ard, PA-C   5 mg at 07/17/19 0851  . bisacodyl (DULCOLAX) suppository 10 mg   10 mg Rectal Daily PRN Tommie Ard, PA-C      . Chlorhexidine Gluconate Cloth 2 % PADS 6 each  6 each Topical Q0600 Darlin Drop, DO   6 each at 07/17/19 0542  . diphenhydrAMINE (BENADRYL) 12.5 MG/5ML elixir 12.5-25 mg  12.5-25 mg Oral Q4H PRN Tommie Ard, PA-C      . divalproex (DEPAKOTE SPRINKLE) capsule 125 mg  125 mg Oral BID Tommie Ard, PA-C   125 mg at 07/17/19 0851  . docusate sodium (COLACE) capsule 100 mg  100 mg Oral BID Tommie Ard, PA-C   100 mg at 07/17/19 0850  . DULoxetine (CYMBALTA) DR capsule 120 mg  120 mg Oral Daily Tommie Ard, PA-C   120 mg at 07/17/19 9390  . fentaNYL (DURAGESIC) 50 MCG/HR 1 patch  1 patch Transdermal Q72H Tommie Ard, PA-C   1 patch at 07/16/19 0844  . ferrous sulfate tablet 325 mg  325 mg Oral BID WC Tommie Ard, PA-C   325 mg at 07/17/19 0851  . hydrALAZINE (APRESOLINE) injection 10 mg  10 mg Intravenous Q4H PRN Tommie Ard, PA-C      . HYDROmorphone (DILAUDID) injection 1 mg  1 mg Intravenous Q4H PRN Tommie Ard, PA-C   1 mg at 07/15/19 0522  . insulin aspart (novoLOG) injection 0-15 Units  0-15 Units Subcutaneous TID WC Dow Adolph N, DO   3 Units at 07/17/19 0850  . insulin aspart (novoLOG) injection 0-5 Units  0-5 Units Subcutaneous QHS Dow Adolph N, DO      . levothyroxine (SYNTHROID) tablet 100 mcg  100 mcg Oral Q0600 Tommie Ard, PA-C   100 mcg at 07/17/19 3009  . magnesium citrate solution 1 Bottle  1 Bottle Oral Once PRN Tommie Ard, PA-C      . menthol-cetylpyridinium (CEPACOL) lozenge 3 mg  1 lozenge Oral PRN Dennie Bible R, PA-C       Or  . phenol (CHLORASEPTIC) mouth spray 1 spray  1 spray Mouth/Throat PRN Tommie Ard, PA-C      . methocarbamol (ROBAXIN) tablet 500 mg  500 mg Oral Q6H PRN Tommie Ard, PA-C       Or  . methocarbamol (ROBAXIN) 500 mg in dextrose 5 % 50 mL IVPB  500 mg Intravenous Q6H PRN Tommie Ard, PA-C      . metoCLOPramide (REGLAN)  tablet 5-10 mg  5-10 mg Oral Q8H PRN Tommie Ard, PA-C       Or  . metoCLOPramide (REGLAN) injection 5-10 mg  5-10 mg Intravenous Q8H PRN Tommie Ard, PA-C      . mometasone-formoterol (DULERA) 200-5 MCG/ACT inhaler 2 puff  2 puff Inhalation BID Tommie Ard, PA-C   2 puff at 07/17/19 2330  . montelukast (SINGULAIR) tablet 10 mg  10 mg Oral Q1400 Tommie Ard, PA-C   10 mg at 07/16/19 1309  . mupirocin ointment (BACTROBAN) 2 % 1 application  1 application Nasal BID Darlin Drop, DO  1 application at 35/00/93 0851  . ondansetron (ZOFRAN) tablet 4 mg  4 mg Oral Q6H PRN Maurice March, PA-C       Or  . ondansetron [facility]) injection 4 mg  4 mg Intravenous Q6H PRN Maurice March, PA-C   4 mg at 07/14/19 2006  . oxyCODONE (Oxy IR/ROXICODONE) immediate release tablet 10 mg  10 mg Oral Q4H PRN Maurice March, PA-C   10 mg at 07/16/19 0341  . oxyCODONE (Oxy IR/ROXICODONE) immediate release tablet 10 mg  10 mg Oral BID Maurice March, PA-C   10 mg at 07/17/19 0850  . polyethylene glycol (MIRALAX / GLYCOLAX) packet 17 g  17 g Oral BID Maurice March, PA-C   17 g at 07/17/19 8182  . rOPINIRole (REQUIP) tablet 0.5 mg  0.5 mg Oral QHS Maurice March, PA-C   0.5 mg at 07/16/19 2143  . traZODone (DESYREL) tablet 100 mg  100 mg Oral QHS Maurice March, PA-C   100 mg at 07/16/19 2142     Discharge Medications: Please see discharge summary for a list of discharge medications.  Relevant Imaging Results:  Relevant Lab Results:   Additional Information SSN [id number]  Neysa Hotter [place], [place]

## 2019-07-17 NOTE — TOC Initial Note (Addendum)
Transition of Care [facility]) - Initial/Assessment Note    Patient Details  Name: Ashley Caldwell MRN: [id number] Date of Birth: 05/16/1940  [facility] CM/SW Contact:    Jacquelynn Cree Phone Number: 07/17/2019, 11:42 AM  Clinical Narrative:                 12:05p CSW spoke with Claiborne Billings of [facility] and confirmed patient is able to return to SNF. Per Claiborne Billings, covid test is not needed considering patient is vaccinated. CSW sent fl2, will only need discharge summary at discharge.  11:30a CSW received consult for possible SNF placement at time of discharge.  CSW met with patient bedside. Patient stated she has resided at [facility] for two years and lives in her own room.   CSW spoke with patient regarding PT recommendation of SNF placement at time of discharge. Patient expressed understanding and is agreeable to return to [facility]. Patient provided verbal permission for her daughter [name] to be a point of contact. No further questions expressed at this time.  CSW contacted [facility] to confirm they are able to meet patients needs, awaiting a callback. CSW will continue to follow.  Expected Discharge Plan: Skilled Nursing Facility Barriers to Discharge: Continued Medical Work up, Ship broker   Patient Goals and CMS Choice Patient states their goals for this hospitalization and ongoing recovery are:: To return to residence [email or web address].gov Compare Post Acute Care list provided to:: Patient Choice offered to / list presented to : Patient  Expected Discharge Plan and Services Expected Discharge Plan: [place] In-house Referral: Clinical Social Work     Living arrangements for the past 2 months: [place]                                      Prior Living Arrangements/Services Living arrangements for the past 2 months: [place] Lives with:: Self, Facility Resident Patient language and need for  interpreter reviewed:: Yes Do you feel safe going back to the place where you live?: Yes      Need for Family Participation in Patient Care: No (Comment) Care giver support system in place?: Yes (comment)   Criminal Activity/Legal Involvement Pertinent to Current Situation/Hospitalization: No - Comment as needed  Activities of Daily Living Home Assistive Devices/Equipment: Wheelchair ADL Screening (condition at time of admission) Patient's cognitive ability adequate to safely complete daily activities?: Yes Is the patient deaf or have difficulty hearing?: No Does the patient have difficulty seeing, even when wearing glasses/contacts?: No Does the patient have difficulty concentrating, remembering, or making decisions?: No Patient able to express need for assistance with ADLs?: Yes Does the patient have difficulty dressing or bathing?: Yes Independently performs ADLs?: Yes (appropriate for developmental age) Does the patient have difficulty walking or climbing stairs?: Yes Weakness of Legs: Both Weakness of Arms/Hands: Both  Permission Sought/Granted Permission sought to share information with : Facility Sport and exercise psychologist, Family Supports Permission granted to share information with : Yes, Verbal Permission Granted  Share Information with NAME: [name]  Permission granted to share info w AGENCY: SNF  Permission granted to share info w Relationship: Daughter  Permission granted to share info w Contact Information: [phone]  Emotional Assessment Appearance:: Appears stated age Attitude/Demeanor/Rapport: Engaged Affect (typically observed): Calm, Pleasant Orientation: : Oriented to Self, Oriented to Place, Oriented to  Time, Oriented to Situation Alcohol / Substance Use: Not Applicable  Psych Involvement: No (comment)  Admission diagnosis:  Leg hematoma [S80.10XA] Acute kidney injury ([place]) [N17.9] Hematoma of right lower extremity, initial encounter [S80.11XA] Patient  Active Problem List   Diagnosis Date Noted  . Pressure injury of skin 07/17/2019  . Leg hematoma 07/13/2019  . Elevated LFTs 04/23/2019  . Atrial fibrillation ([place]) 04/23/2019  . Chronic pain syndrome 04/23/2019  . Opioid dependence ([place]) 04/23/2019  . Constipation due to opioid therapy 04/23/2019  . 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  . 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  . High risk medication use 06/12/2016  . Closed fracture of right proximal humerus 05/01/2016  . History of renal failure 04/26/2016  . Primary osteoarthritis of both knees 04/26/2016  . Renal failure 04/13/2016  . Acute renal failure ([place]) 04/13/2016  . Psoriasis 01/18/2016  . Fibromyalgia 01/18/2016  . DDD (degenerative disc disease), lumbar 01/18/2016  . Osteoarthritis of both knees 01/18/2016  . Elevated cholesterol 01/18/2016  . Hypoglycemia associated with diabetes ([place]) 01/16/2011  . Compression fracture of vertebral column ([place]) 01/16/2011  . Hyponatremia 01/14/2011  . Pneumonia 01/13/2011  . Hyperglycemia 01/13/2011  . Weakness generalized 01/13/2011  . DM type 2 (diabetes mellitus, type 2) ([place]) 01/13/2011  . HTN (hypertension), benign 01/13/2011  . Psoriatic arthritis ([place]) 01/13/2011  . Anemia 01/13/2011  . Thyroiditis, chronic 01/13/2011  . Morbid obesity ([place]) 01/13/2011  . Asthma 01/13/2011   PCP:  System, Pcp Not In Pharmacy:   [place], [place] [address] [place] [address] Phone: [phone] Fax: [phone]     Social Determinants of Health (SDOH) Interventions    Readmission Risk Interventions Readmission Risk Prevention Plan 04/23/2019  Transportation Screening Complete  PCP or Specialist Appt within 3-5 Days Not Complete  Not Complete comments SNF resident  [facility] or [place] Not Complete  HRI or Home Care Consult comments SNF resident  Social Work Consult for [place]  Planning/Counseling Complete  Palliative Care Screening Not Applicable  Medication Review (RN Care Manager) Referral to Pharmacy  Some recent data might be hidden

## 2019-07-18 LAB — CBC WITH DIFFERENTIAL/PLATELET
Abs Immature Granulocytes: 0.05 10*3/uL (ref 0.00–0.07)
Basophils Absolute: 0 10*3/uL (ref 0.0–0.1)
Basophils Relative: 0 %
Eosinophils Absolute: 0.1 10*3/uL (ref 0.0–0.5)
Eosinophils Relative: 1 %
HCT: 22.5 % — ABNORMAL LOW (ref 36.0–46.0)
Hemoglobin: 7.5 g/dL — ABNORMAL LOW (ref 12.0–15.0)
Immature Granulocytes: 1 %
Lymphocytes Relative: 20 %
Lymphs Abs: 1.5 10*3/uL (ref 0.7–4.0)
MCH: 35.4 pg — ABNORMAL HIGH (ref 26.0–34.0)
MCHC: 33.3 g/dL (ref 30.0–36.0)
MCV: 106.1 fL — ABNORMAL HIGH (ref 80.0–100.0)
Monocytes Absolute: 1.3 10*3/uL — ABNORMAL HIGH (ref 0.1–1.0)
Monocytes Relative: 16 %
Neutro Abs: 4.8 10*3/uL (ref 1.7–7.7)
Neutrophils Relative %: 62 %
Platelets: 245 10*3/uL (ref 150–400)
RBC: 2.12 MIL/uL — ABNORMAL LOW (ref 3.87–5.11)
RDW: 18.9 % — ABNORMAL HIGH (ref 11.5–15.5)
WBC: 7.8 10*3/uL (ref 4.0–10.5)
nRBC: 0.3 % — ABNORMAL HIGH (ref 0.0–0.2)

## 2019-07-18 LAB — COMPREHENSIVE METABOLIC PANEL
ALT: 7 U/L (ref 0–44)
AST: 22 U/L (ref 15–41)
Albumin: 2.3 g/dL — ABNORMAL LOW (ref 3.5–5.0)
Alkaline Phosphatase: 153 U/L — ABNORMAL HIGH (ref 38–126)
Anion gap: 9 (ref 5–15)
BUN: 28 mg/dL — ABNORMAL HIGH (ref 8–23)
CO2: 27 mmol/L (ref 22–32)
Calcium: 8.1 mg/dL — ABNORMAL LOW (ref 8.9–10.3)
Chloride: 95 mmol/L — ABNORMAL LOW (ref 98–111)
Creatinine, Ser: 1.19 mg/dL — ABNORMAL HIGH (ref 0.44–1.00)
GFR calc Af Amer: 51 mL/min — ABNORMAL LOW (ref >60)
GFR calc non Af Amer: 44 mL/min — ABNORMAL LOW (ref >60)
Glucose, Bld: 231 mg/dL — ABNORMAL HIGH (ref 70–99)
Potassium: 3.7 mmol/L (ref 3.5–5.1)
Sodium: 131 mmol/L — ABNORMAL LOW (ref 135–145)
Total Bilirubin: 1.1 mg/dL (ref 0.3–1.2)
Total Protein: 4.7 g/dL — ABNORMAL LOW (ref 6.5–8.1)

## 2019-07-18 LAB — GLUCOSE, CAPILLARY
Glucose-Capillary: 176 mg/dL — ABNORMAL HIGH (ref 70–99)
Glucose-Capillary: 160 mg/dL — ABNORMAL HIGH (ref 70–99)

## 2019-07-18 LAB — VITAMIN B12: Vitamin B-12: 2663 pg/mL — ABNORMAL HIGH (ref 180–914)

## 2019-07-18 LAB — IRON AND TIBC
Iron: 27 ug/dL — ABNORMAL LOW (ref 28–170)
Saturation Ratios: 11 % (ref 10.4–31.8)
TIBC: 237 ug/dL — ABNORMAL LOW (ref 250–450)
UIBC: 210 ug/dL

## 2019-07-18 LAB — FERRITIN: Ferritin: 66 ng/mL (ref 11–307)

## 2019-07-18 MED ORDER — BACLOFEN 5 MG PO TABS: 5.0000 mg | ORAL_TABLET | Freq: Three times a day (TID) | ORAL | 0 refills | Status: AC

## 2019-07-18 MED ORDER — OXYCODONE HCL 10 MG PO TABS: 10.0000 mg | ORAL_TABLET | ORAL | 0 refills | Status: DC

## 2019-07-18 MED ORDER — OXYCODONE HCL 10 MG PO TABS: 10.0000 mg | ORAL_TABLET | ORAL | 0 refills | Status: AC

## 2019-07-18 MED ORDER — FERROUS SULFATE 325 (65 FE) MG PO TABS: 325.0000 mg | ORAL_TABLET | Freq: Two times a day (BID) | ORAL | 0 refills | Status: DC

## 2019-07-18 MED ORDER — FENTANYL 50 MCG/HR TD PT72: 1.0000 | MEDICATED_PATCH | TRANSDERMAL | 0 refills | Status: AC

## 2019-07-18 MED ORDER — BACLOFEN 5 MG PO TABS: 5.0000 mg | ORAL_TABLET | Freq: Three times a day (TID) | ORAL | 0 refills | Status: DC

## 2019-07-18 MED ORDER — FENTANYL 50 MCG/HR TD PT72: 1.0000 | MEDICATED_PATCH | TRANSDERMAL | 0 refills | Status: DC

## 2019-07-18 NOTE — Discharge Instructions (Signed)
Hematoma A hematoma is a collection of blood. A hematoma can happen:  Under the skin.  In an organ.  In a body space.  In a joint space.  In other tissues. The blood can thicken (clot) to form a lump that you can see and feel. The lump is often hard and may become sore and tender. The lump can be very small or very big. Most hematomas get better in a few days to weeks. However, some hematomas may be serious and need medical care. What are the causes? This condition is caused by:  An injury.  Blood that leaks under the skin.  Problems from surgeries.  Medical conditions that cause bleeding or bruising. What increases the risk? You are more likely to develop this condition if:  You are an older adult.  You use medicines that thin your blood. What are the signs or symptoms? Symptoms depend on where the hematoma is in your body.  If the hematoma is under the skin, there is: ? A firm lump on the body. ? Pain and tenderness in the area. ? Bruising. The skin above the lump may be blue, dark blue, purple-red, or yellowish.  If the hematoma is deep in the tissues or body spaces, there may be: ? Blood in the stomach. This may cause pain in the belly (abdomen), weakness, passing out (fainting), and shortness of breath. ? Blood in the head. This may cause a headache, weakness, trouble speaking or understanding speech, or passing out. How is this diagnosed? This condition is diagnosed based on:  Your medical history.  A physical exam.  Imaging tests, such as ultrasound or CT scan.  Blood tests. How is this treated? Treatment depends on the cause, size, and location of the hematoma. Treatment may include:  Doing nothing. Many hematomas go away on their own without treatment.  Surgery or close monitoring. This may be needed for large hematomas or hematomas that affect the body's organs.  Medicines. These may be given if a medical condition caused the hematoma. Follow  these instructions at home: Managing pain, stiffness, and swelling   If told, put ice on the area. ? Put ice in a plastic bag. ? Place a towel between your skin and the bag. ? Leave the ice on for 20 minutes, 2-3 times a day for the first two days.  If told, put heat on the affected area after putting ice on the area for two days. Use the heat source that your doctor tells you to use. This could be a moist heat pack or a heating pad. To do this: ? Place a towel between your skin and the heat source. ? Leave the heat on for 20-30 minutes. ? Remove the heat if your skin turns bright red. This is very important if you are unable to feel pain, heat, or cold. You may have a greater risk of getting burned.  Raise (elevate) the affected area above the level of your heart while you are sitting or lying down.  Wrap the affected area with an elastic bandage, if told by your doctor. Do not wrap the bandage too tightly.  If your hematoma is on a leg or foot and is painful, your doctor may give you crutches. Use them as told by your doctor. General instructions  Take over-the-counter and prescription medicines only as told by your doctor.  Keep all follow-up visits as told by your doctor. This is important. Contact a doctor if:  You have a  fever.  The swelling or bruising gets worse.  You start to get more hematomas. Get help right away if:  Your pain gets worse.  Your pain is not getting better with medicine.  Your skin over the hematoma breaks or starts to bleed.  Your hematoma is in your chest or belly and you: ? Pass out. ? Feel weak. ? Become short of breath.  You have a hematoma on your scalp that is caused by a fall or injury, and you: ? Have a headache that gets worse. ? Have trouble speaking or understanding speech. ? Become less alert or you pass out. Summary  A hematoma is a collection of blood in any part of your body.  Most hematomas get better on their own in a  few days to weeks. Some may need medical care.  Follow instructions from your doctor about how to care for your hematoma.  Contact a doctor if the swelling or bruising gets worse, or if you are short of breath. This information is not intended to replace advice given to you by your health care provider. Make sure you discuss any questions you have with your health care provider. Document Revised: 08/08/2017 Document Reviewed: 08/08/2017 Elsevier Patient Education  2020 [employer] INSTRUCTIONS AFTER SURGERY  o Remove items at home which could result in a fall. This includes throw rugs or furniture in walking pathways o ICE to the affected joint every three hours while awake for 30 minutes at a time, for at least the first 3-5 days, and then as needed for pain and swelling.  Continue to use ice for pain and swelling. You may notice swelling that will progress down to the foot and ankle.  This is normal after surgery.  Elevate your leg when you are not up walking on it.   o Continue to use the breathing machine you got in the hospital (incentive spirometer) which will help keep your temperature down.  It is common for your temperature to cycle up and down following surgery, especially at night when you are not up moving around and exerting yourself.  The breathing machine keeps your lungs expanded and your temperature down.   DIET:  As you were doing prior to hospitalization, we recommend a well-balanced diet.     Maintain surgical dressing on right leg until follow up appointment due to the significant concerns about the thickness of her skin and concerns for tearing as well as compliance concerns      DRESSING / WOUND CARE / SHOWERING  You may shower 3 days after surgery, but keep the wounds dry during showering.  You may use an occlusive plastic wrap (Press'n Seal for example), NO SOAKING/SUBMERGING IN THE BATHTUB.  If the bandage gets wet, change with a clean dry gauze.  If the  incision gets wet, pat the wound dry with a clean towel.  ACTIVITY  o Increase activity slowly as tolerated, but follow the weight bearing instructions below.   o No driving for 6 weeks or until further direction given by your physician.  You cannot drive while taking narcotics.  o No lifting or carrying greater than 10 lbs. until further directed by your surgeon. o Avoid periods of inactivity such as sitting longer than an hour when not asleep. This helps prevent blood clots.  o You may return to work once you are authorized by your doctor.     WEIGHT BEARING   Weight bearing as tolerated with assist device (walker, cane, etc)  as directed, use it as long as suggested by your surgeon or therapist, typically at least 4-6 weeks.   CONSTIPATION  Constipation is defined medically as fewer than three stools per week and severe constipation as less than one stool per week.  Even if you have a regular bowel pattern at home, your normal regimen is likely to be disrupted due to multiple reasons following surgery.  Combination of anesthesia, postoperative narcotics, change in appetite and fluid intake all can affect your bowels.   YOU MUST use at least one of the following options; they are listed in order of increasing strength to get the job done.  They are all available over the counter, and you may need to use some, POSSIBLY even all of these options:    Drink plenty of fluids (prune juice may be helpful) and high fiber foods Colace 100 mg by mouth twice a day  Senokot for constipation as directed and as needed Dulcolax (bisacodyl), take with full glass of water  Miralax (polyethylene glycol) once or twice a day as needed.  If you have tried all these things and are unable to have a bowel movement in the first 3-4 days after surgery call either your surgeon or your primary doctor.    If you experience loose stools or diarrhea, hold the medications until you stool forms back up.  If your  symptoms do not get better within 1 week or if they get worse, check with your doctor.  If you experience "the worst abdominal pain ever" or develop nausea or vomiting, please contact the office immediately for further recommendations for treatment.   ITCHING:  If you experience itching with your medications, try taking only a single pain pill, or even half a pain pill at a time.  You can also use Benadryl over the counter for itching or also to help with sleep.   MEDICATIONS:  See your medication summary on the "After Visit Summary" that nursing will review with you.  You may have some home medications which will be placed on hold until you complete the course of blood thinner medication.  It is important for you to complete the blood thinner medication as prescribed.  PRECAUTIONS:  If you experience chest pain or shortness of breath - call 911 immediately for transfer to the hospital emergency department.   If you develop a fever greater that 101 F, purulent drainage from wound, increased redness or drainage from wound, foul odor from the wound/dressing, or calf pain - CONTACT YOUR SURGEON.                                                   FOLLOW-UP APPOINTMENTS:  If you do not already have a post-op appointment, please call the office for an appointment to be seen by your surgeon.  Guidelines for how soon to be seen are listed in your "After Visit Summary", but are typically between 1-4 weeks after surgery.  MAKE SURE YOU:  . Understand these instructions.  . Get help right away if you are not doing well or get worse.    Thank you for letting us be a part of your medical care team.  It is a privilege we respect greatly.  We hope these instructions will help you stay on track for a fast and full recovery!

## 2019-07-18 NOTE — Discharge Summary (Signed)
Discharge Summary  Ashley Caldwell HXT:[id number] DOB: [birth year]  PCP: System, Pcp Not In  Admit date: 07/13/2019 Discharge date: 07/18/2019  Time spent: 35 minutes  Recommendations for Outpatient Follow-up:  1. Follow-up with orthopedic surgery in 2 weeks 2. Follow-up with cardiology 3. Follow-up with pulmonology 4. Follow-up with your primary care provider, obtain cardiology referral from your PCP if you do not have one. 5. Continue PT OT with assistance and fall precautions   Discharge Diagnoses:  Active Hospital Problems   Diagnosis Date Noted  . Leg hematoma 07/13/2019  . Pressure injury of skin 07/17/2019  . Psoriatic arthritis ([place]) 01/13/2011  . HTN (hypertension), benign 01/13/2011  . DM type 2 (diabetes mellitus, type 2) ([place]) 01/13/2011  . Asthma 01/13/2011  . Anemia 01/13/2011    Resolved Hospital Problems  No resolved problems to display.    Discharge Condition: Stable  Diet recommendation: Resume previous diet  Vitals:   07/18/19 0620 07/18/19 0822  BP: (!) 125/59 (!) 105/94  Pulse: (!) 51 86  Resp: 14 17  Temp: 98.1 F (36.7 C) (!) 97.4 F (36.3 C)  SpO2: 97% 92%    History of present illness:  Ashley C McCarthyis a 79 y.o.femalewithhistory of recent admission for herpes ophthalmicus of the right eye, at the time patient was found to have new onset A. fib and was started on Eliquis.  She fell onto her wheelchair about a week ago. Since then she has developed a hematoma on her right leg.  ED Course:In the ER CT scan showed large hematoma of the right leg for which orthopedic surgeon Dr. Alvan Dame was consulted.  Post evacuation of hematoma on 07/14/2019 by Dr. Alvan Dame.   Hospital course complicated by acute blood loss, received 1 unit PRBC.  07/18/19: Seen and examined.  No acute events overnight overnight.  She feels good.  She has no new complaints.  Her right lower extremity pain is well controlled.  Hospital Course:  Principal Problem:   Leg  hematoma Active Problems:   DM type 2 (diabetes mellitus, type 2) (H[facility])   HTN (hypertension), benign   Psoriatic arthritis ([place])   Anemia   Asthma   Pressure injury of skin  Right leg hematoma secondary to trauma in the setting of anticoagulation with Eliquis post evacuation hematoma on 07/14/2019 by Dr. Alvan Dame POD #4 Continue pain control Seen by orthopedic surgery, Dr. Alvan Dame, recommends to keep the surgical dressing in place until follow-up in 2 weeks.  Weightbearing as tolerated to bilateral lower extremity with decubitus precautions.    Acute blood loss anemia post evacuation hematoma/iron deficiency anemia Transfused 1 unit PRBC 4/29 Hemoglobin 7.5. Iron studies suggestive of iron deficiency Continue ferrous sulfate 325 mg twice daily Right lower extremity in surgical dressing, no evidence of active bleeding. Follow-up with orthopedic surgery and PCP  Newly diagnosed A. Fib Recently diagnosed with A. Fib, on Eliquis Rate controlled, not on any rate control agent. Follow-up with cardiology  AKI on CKD 3A, improving Presented with creatinine 1.77 with GFR of 27 To be at baseline 1.1 with GFR 44 Continue to avoid nephrotoxins, hypotension, and dehydration   Resolved leukocytosis, likely reactive in the setting of trauma Urine analysis negative, chest x-ray no active disease.  Isolated elevated alkaline phosphatase Unclear etiology AST, ALT, T bili normal Elevated alkaline phosphatase, 178 > 153  Hypothyroidism Last TSH 1.0 (06/17/19) Continue levothyroxine  Essential hypertension Blood pressure is soft Anti hypertensive on hold.  COPD Continue home bronchodilators Maintain O2 saturation greater  than 92%  History of COVID-19 viral infection Stable Diagnosed on 04/22/2019  Chronic anxiety/depression Stable Continue home medications  Recent herpes ophthalmicus of the right eye Stable  Chronic pain syndrome Stable  Continue home medications  Type  2 diabetes with hyperglycemia likely exacerbated by her trauma/wound to her right lower extremity. Hemoglobin A1c 5.3. Continue insulin sliding scale for CBGs greater than 200.  Morbid obesity BMI 42 Recommend weight loss outpatient   Ambulatory dysfunction PT OT recommends SNF  Continue PT OT with assistance and fall precaution  High risk for developping decubitus ulcer Continue decubitus precautions at SNF as recommended by ortho.  Stage 2 bilateral buttocks pressure injury, POA Continue decubitus precautions    Code Status:DNR.     Consults called:Orthopedics.   Discharge Exam: BP (!) 105/94 (BP Location: Left Arm) Comment: nurse notified  Pulse 86   Temp (!) 97.4 F (36.3 C) (Oral) Comment: nurse notified  Resp 17   Ht 5' 9" (1.753 m)   Wt 129.7 kg   SpO2 92%   BMI 42.23 kg/m  . General: 79 y.o. year-old female well developed well nourished in no acute distress.  Alert and interactive. . Cardiovascular: Regular rate and rhythm with no rubs or gallops.  No thyromegaly or JVD noted.   Marland Kitchen Respiratory: Clear to auscultation with no wheezes or rales. Good inspiratory effort. . Abdomen: Soft nontender nondistended with normal bowel sounds. . Musculoskeletal: Right lower extremity in surgical dressing. Marland Kitchen Psychiatry: Mood is appropriate for condition and setting  Discharge Instructions You were cared for by a hospitalist during your hospital stay. If you have any questions about your discharge medications or the care you received while you were in the hospital after you are discharged, you can call the unit and asked to speak with the hospitalist on call if the hospitalist that took care of you is not available. Once you are discharged, your primary care physician will handle any further medical issues. Please note that NO REFILLS for any discharge medications will be authorized once you are discharged, as it is imperative that you return to your primary care  physician (or establish a relationship with a primary care physician if you do not have one) for your aftercare needs so that they can reassess your need for medications and monitor your lab values.   Allergies as of 07/18/2019      Reactions   Lyrica [pregabalin] Shortness Of Breath   Amoxicillin-pot Clavulanate Nausea And Vomiting   Latex Itching, Rash      Medication List    STOP taking these medications   furosemide 40 MG tablet Commonly known as: LASIX   lisinopril 5 MG tablet Commonly known as: ZESTRIL   metoprolol tartrate 25 MG tablet Commonly known as: LOPRESSOR     TAKE these medications   acetaminophen 325 MG tablet Commonly known as: TYLENOL Take 650 mg by mouth every 6 (six) hours as needed for mild pain (temp >100.1).   albuterol 108 (90 Base) MCG/ACT inhaler Commonly known as: VENTOLIN HFA Inhale 2 puffs into the lungs every 4 (four) hours as needed for wheezing or shortness of breath.   apixaban 5 MG Tabs tablet Commonly known as: ELIQUIS Take 1 tablet (5 mg total) by mouth 2 (two) times daily. What changed: additional instructions   Baclofen 5 MG Tabs Take 5 mg by mouth 3 (three) times daily. 8a, 2p, 8p   Cepacol Sore Throat & Cough 5-7.5 MG Lozg Generic drug: Dextromethorphan-Benzocaine Use as directed  1 lozenge in the mouth or throat every 2 (two) hours as needed (cough).   chlorhexidine 0.12 % solution Commonly known as: PERIDEX 15 mLs by Mouth Rinse route every 12 (twelve) hours.   divalproex 125 MG capsule Commonly known as: DEPAKOTE SPRINKLE Take 125 mg by mouth 2 (two) times daily. 8a and 8p   DULoxetine 60 MG capsule Commonly known as: CYMBALTA Take 1 capsule (60 mg total) by mouth 2 (two) times daily. What changed:   how much to take  when to take this   fentaNYL 50 MCG/HR Commonly known as: [place] 1 patch onto the skin every 3 (three) days.   ferrous sulfate 325 (65 FE) MG tablet Take 1 tablet (325 mg total) by mouth  2 (two) times daily with a meal.   Fluticasone-Salmeterol 500-50 MCG/DOSE Aepb Commonly known as: ADVAIR Inhale 1 puff into the lungs 2 (two) times daily.   Glucagon Emergency 1 MG Kit Inject 1 mg into the muscle every 6 (six) hours as needed (for blood suger <60).   insulin aspart 100 UNIT/ML injection Commonly known as: NovoLOG Before each meal 3 times a day, 140-199 - 2 units, 200-250 - 4 units, 251-299 - 6 units,  300-349 - 8 units,  350 or above 10 units. Dispense syringes and needles as needed, Ok to switch to PEN if approved. Substitute to any brand approved. DX DM2, Code E11.65 What changed:   how much to take  how to take this  when to take this  additional instructions   insulin detemir 100 UNIT/ML injection Commonly known as: LEVEMIR Inject 10-25 Units into the skin See admin instructions. Inject 25 units subcutaneously daily at 8am and 10 units daily at 8pm   levothyroxine 100 MCG tablet Commonly known as: SYNTHROID Take 1 tablet (100 mcg total) by mouth daily. What changed: when to take this   metFORMIN 1000 MG tablet Commonly known as: GLUCOPHAGE Take 1,000 mg by mouth daily at 2 PM.   montelukast 10 MG tablet Commonly known as: SINGULAIR Take 10 mg by mouth daily at 2 PM.   multivitamin with minerals Tabs tablet Take 1 tablet by mouth daily at 2 PM. Multi for Her 50 plus   Oxycodone HCl 10 MG Tabs Take 10 mg by mouth See admin instructions. Take one tablet (10 mg) by mouth twice daily at 6 AM and 10 AM, may also take an additional 10 mg every four hours as needed for breakthrough pain   polyethylene glycol 17 g packet Commonly known as: MIRALAX / GLYCOLAX Take 17 g by mouth every 3 (three) days.   Robitussin Cough+Chest Cong DM 5-100 MG/5ML Liqd Generic drug: Dextromethorphan-guaiFENesin Take 10 mLs by mouth every 4 (four) hours as needed (cough).   rOPINIRole 0.5 MG tablet Commonly known as: REQUIP Take 0.5 mg by mouth at bedtime.   senna 8.6  MG Tabs tablet Commonly known as: SENOKOT Take 1 tablet by mouth 2 (two) times daily. 8a and 8p   sodium chloride 0.65 % Soln nasal spray Commonly known as: OCEAN Place 1 spray into both nostrils 3 (three) times daily as needed for congestion.   Systane 0.4-0.3 % Soln Generic drug: Polyethyl Glycol-Propyl Glycol Place 1 drop into both eyes 4 (four) times daily as needed (dry eyes).   traZODone 100 MG tablet Commonly known as: DESYREL Take 100 mg by mouth at bedtime.   valACYclovir 1000 MG tablet Commonly known as: VALTREX Take 1,000 mg by mouth daily.   Vitamin  D3 50 MCG (2000 UT) Tabs Take 2,000 Units by mouth daily at 2 PM.      Allergies  Allergen Reactions  . Lyrica [Pregabalin] Shortness Of Breath  . Amoxicillin-Pot Clavulanate Nausea And Vomiting  . Latex Itching and Rash   Follow-up Information    Paralee Cancel, MD. Schedule an appointment as soon as possible for a visit in 2 weeks.   Specialty: Orthopedic Surgery Contact information: [address] [place] [address] [place]. Call in 1 day(s).   Why: Please call for a post hospital follow-up appointment. Contact information: [address] [place] [address] [phone]           The results of significant diagnostics from this hospitalization (including imaging, microbiology, ancillary and laboratory) are listed below for reference.    Significant Diagnostic Studies: CT Tibia Fibula Right Wo Contrast  Result Date: 07/13/2019 CLINICAL DATA:  Blunt trauma 1 week ago with known hematoma, active bleeding today EXAM: CT OF THE LOWER RIGHT EXTREMITY WITHOUT CONTRAST TECHNIQUE: Multidetector CT imaging of the right lower extremity was performed according to the standard protocol. COMPARISON:  None. FINDINGS: Bones/Joint/Cartilage Degenerative changes are noted in the knee joint in all 3 compartments. No  posttraumatic abnormality in the distal femur is seen. The tibia and fibula are well visualize without evidence of acute fracture or dislocation. Mild osteopenia is noted. The bony structures of the foot are within normal limits. No acute fracture is seen. Ligaments Suboptimally assessed by CT. Muscles and Tendons Relative muscular atrophy is noted with fatty infiltration identified. Soft tissues Some generalized edema is noted throughout the lower extremity on the right. There is a large mixed attenuation subcutaneous collection identified which measures approximately 18 x 3 cm in greatest transverse and AP dimensions. It extends for approximately 22 cm in craniocaudad projection and is consistent with a focal hematoma. This lies along the lateral aspect of the lower leg. Given its mixed attenuation it likely represents a combination of acute and chronic hematoma. A few small air foci are noted posteriorly likely representing skin breakdown. Correlate with the clinical exam. No other definitive hematoma is noted. No other soft tissue abnormality is seen. IMPRESSION: Large hematoma involving the lateral and posterior aspect of the right lower leg as described. This is consistent with the given clinical history of recent trauma. A few small foci of air are noted posteriorly likely related to local breakdown of the skin. Mild associated edema is noted within the lower extremity. Degenerative changes of the knee joint. No acute bony abnormality is noted. Electronically Signed   By: Inez Catalina M.D.   On: 07/13/2019 21:08   CT Foot Right Wo Contrast  Result Date: 07/13/2019 CLINICAL DATA:  Blunt trauma 1 week ago with known hematoma, active bleeding today EXAM: CT OF THE LOWER RIGHT EXTREMITY WITHOUT CONTRAST TECHNIQUE: Multidetector CT imaging of the right lower extremity was performed according to the standard protocol. COMPARISON:  None. FINDINGS: Bones/Joint/Cartilage Degenerative changes are noted in the knee  joint in all 3 compartments. No posttraumatic abnormality in the distal femur is seen. The tibia and fibula are well visualize without evidence of acute fracture or dislocation. Mild osteopenia is noted. The bony structures of the foot are within normal limits. No acute fracture is seen. Ligaments Suboptimally assessed by CT. Muscles and Tendons Relative muscular atrophy is noted with fatty infiltration identified. Soft tissues Some generalized edema  is noted throughout the lower extremity on the right. There is a large mixed attenuation subcutaneous collection identified which measures approximately 18 x 3 cm in greatest transverse and AP dimensions. It extends for approximately 22 cm in craniocaudad projection and is consistent with a focal hematoma. This lies along the lateral aspect of the lower leg. Given its mixed attenuation it likely represents a combination of acute and chronic hematoma. A few small air foci are noted posteriorly likely representing skin breakdown. Correlate with the clinical exam. No other definitive hematoma is noted. No other soft tissue abnormality is seen. IMPRESSION: Large hematoma involving the lateral and posterior aspect of the right lower leg as described. This is consistent with the given clinical history of recent trauma. A few small foci of air are noted posteriorly likely related to local breakdown of the skin. Mild associated edema is noted within the lower extremity. Degenerative changes of the knee joint. No acute bony abnormality is noted. Electronically Signed   By: Inez Catalina M.D.   On: 07/13/2019 21:08   DG CHEST PORT 1 VIEW  Result Date: 07/15/2019 CLINICAL DATA:  Hypoxia.  Leukocytosis. EXAM: PORTABLE CHEST 1 VIEW COMPARISON:  Chest x-ray dated June 17, 2019. FINDINGS: Stable cardiomediastinal silhouette. Chronically coarsened interstitial markings. No focal consolidation, pleural effusion, or pneumothorax. No acute osseous abnormality. Chronic right proximal  humerus fracture. IMPRESSION: 1. No active disease. 2. Chronic interstitial changes. Electronically Signed   By: Titus Dubin M.D.   On: 07/15/2019 09:13    Microbiology: Recent Results (from the past 240 hour(s))  Respiratory Panel by RT PCR (Flu A&B, Covid) - Nasopharyngeal Swab     Status: None   Collection Time: 07/13/19 10:11 PM   Specimen: Nasopharyngeal Swab  Result Value Ref Range Status   SARS Coronavirus 2 by RT PCR NEGATIVE NEGATIVE Final    Comment: (NOTE) SARS-CoV-2 target nucleic acids are NOT DETECTED. The SARS-CoV-2 RNA is generally detectable in upper respiratoy specimens during the acute phase of infection. The lowest concentration of SARS-CoV-2 viral copies this assay can detect is 131 copies/mL. A negative result does not preclude SARS-Cov-2 infection and should not be used as the sole basis for treatment or other patient management decisions. A negative result may occur with  improper specimen collection/handling, submission of specimen other than nasopharyngeal swab, presence of viral mutation(s) within the areas targeted by this assay, and inadequate number of viral copies (<131 copies/mL). A negative result must be combined with clinical observations, patient history, and epidemiological information. The expected result is Negative. Fact Sheet for Patients:  PinkCheek.be Fact Sheet for Healthcare Providers:  GravelBags.it This test is not yet ap proved or cleared by the [place] FDA and  has been authorized for detection and/or diagnosis of SARS-CoV-2 by FDA under an Emergency Use Authorization (EUA). This EUA will remain  in effect (meaning this test can be used) for the duration of the COVID-19 declaration under Section 564(b)(1) of the Act, 21 U.S.C. section 360bbb-3(b)(1), unless the authorization is terminated or revoked sooner.    Influenza A by PCR NEGATIVE NEGATIVE Final   Influenza B  by PCR NEGATIVE NEGATIVE Final    Comment: (NOTE) The Xpert Xpress SARS-CoV-2/FLU/RSV assay is intended as an aid in  the diagnosis of influenza from Nasopharyngeal swab specimens and  should not be used as a sole basis for treatment. Nasal washings and  aspirates are unacceptable for Xpert Xpress SARS-CoV-2/FLU/RSV  testing. Fact Sheet for Patients: PinkCheek.be Fact Sheet for Healthcare  Providers: GravelBags.it This test is not yet approved or cleared by the [place] and  has been authorized for detection and/or diagnosis of SARS-CoV-2 by  FDA under an Emergency Use Authorization (EUA). This EUA will remain  in effect (meaning this test can be used) for the duration of the  Covid-19 declaration under Section 564(b)(1) of the Act, 21  U.S.C. section 360bbb-3(b)(1), unless the authorization is  terminated or revoked. Performed at [facility], [place] [address], [place], [place] [address]   MRSA PCR Screening     Status: Abnormal   Collection Time: 07/14/19 11:00 AM   Specimen: Nasal Mucosa; Nasopharyngeal  Result Value Ref Range Status   MRSA by PCR POSITIVE (A) NEGATIVE Final    Comment:        The GeneXpert MRSA Assay (FDA approved for NASAL specimens only), is one component of a comprehensive MRSA colonization surveillance program. It is not intended to diagnose MRSA infection nor to guide or monitor treatment for MRSA infections. RESULT CALLED TO, READ BACK BY AND VERIFIED WITH: Sharlot Gowda RN 15:45 07/14/19 ([email or web address]) Performed at [facility], [place] [address], [place], [place] [address]      Labs: Basic Metabolic Panel: Recent Labs  Lab 07/14/19 0332 07/15/19 0623 07/16/19 0728 07/17/19 0244 07/18/19 0309  NA 137 137 132* 131* 131*  K 4.2 4.1 4.2 3.9 3.7  CL 99 95* 94* 95* 95*  CO2 _0 GLUCOSE 175* 145* 238* 224* 231*  BUN 22 27* 34* 32* 28*  CREATININE 1.47* 1.94* 1.69*  1.36* 1.19*  CALCIUM 8.2* 7.8* 7.7* 8.2* 8.1*  MG  --  1.5*  --   --   --   PHOS  --  5.7*  --   --   --    Liver Function Tests: Recent Labs  Lab 07/13/19 1907 07/15/19 0623 07/16/19 0728 07/17/19 0244 07/18/19 0309  AST 104* 52* _1 ALT 50* _2 ALKPHOS 337* 189* 163* 178* 153*  BILITOT 1.7* 0.9 1.1 1.0 1.1  PROT 5.5* 4.0* 4.3* 4.8* 4.7*  ALBUMIN 2.9* 2.2* 2.1* 2.4* 2.3*   No results for input(s): LIPASE, AMYLASE in the last 168 hours. No results for input(s): AMMONIA in the last 168 hours. CBC: Recent Labs  Lab 07/13/19 1907 07/13/19 1907 07/14/19 0332 07/14/19 0332 07/15/19 0820 07/15/19 1822 07/16/19 0728 07/17/19 0244 07/18/19 0309  WBC 11.7*   < > 14.7*  --  9.5  --  7.6 9.0 7.8  NEUTROABS 8.8*  --   --   --  5.5  --  5.5 6.7 4.8  HGB 11.4*   < > 9.2*   < > 7.0* 6.7* 7.4* 7.6* 7.5*  HCT 35.9*   < > 28.9*   < > 20.9* 20.1* 21.9* 22.6* 22.5*  MCV 110.1*   < > 112.0*  --  106.6*  --  103.3* 103.2* 106.1*  PLT 259   < > 228  --  216  --  173 214 245   < > = values in this interval not displayed.   Cardiac Enzymes: No results for input(s): CKTOTAL, CKMB, CKMBINDEX, TROPONINI in the last 168 hours. BNP: BNP (last 3 results) No results for input(s): BNP in the last 8760 hours.  ProBNP (last 3 results) No results for input(s): PROBNP in the last 8760 hours.  CBG: Recent Labs  Lab 07/17/19 1147 07/17/19 1640 07/17/19 2112 07/18/19 0758 07/18/19  [place]       Signed:  Kayleen Memos, MD [facility] 07/18/2019, 1:35 PM

## 2019-07-18 NOTE — Plan of Care (Signed)

## 2019-07-18 NOTE — TOC Transition Note (Signed)
Transition of Care [facility]) - CM/SW Discharge Note   Patient Details  Name: Ashley Caldwell MRN: [id number] Date of Birth: 1940/08/21  Transition of Care [facility]) CM/SW Contact:  Patrice Paradise, LCSW Phone Number:[phone] 07/18/2019, 2:30 PM   Clinical Narrative:     Patient will DC to:?[facility] Anticipated DC date:?07/18/19 Family notified:?Left VM with [name] Transport JY:NWGN   Per MD patient ready for DC to [facility]. RN, patient, patient's family, and facility notified of DC. Discharge Summary sent to facility. RN given number for report [phone] room 307. DC packet on chart. Ambulance transport requested for patient.   CSW signing off.   Judd Lien, [place] [phone]   Final next level of care: Skilled Nursing Facility Barriers to Discharge: No Barriers Identified   Patient Goals and CMS Choice Patient states their goals for this hospitalization and ongoing recovery are:: To return to residence CMS Medicare.gov Compare Post Acute Care list provided to:: Patient Choice offered to / list presented to : Patient  Discharge Placement              Patient chooses bed at: [facility] Patient to be transferred to facility by: [facility] Name of family member notified: [name] Patient and family notified of of transfer: 07/18/19  Discharge Plan and Services In-house Referral: Clinical Social Work                                   Social Determinants of Health (SDOH) Interventions     Readmission Risk Interventions Readmission Risk Prevention Plan 04/23/2019  Transportation Screening Complete  PCP or Specialist Appt within 3-5 Days Not Complete  Not Complete comments SNF resident  [facility] or Home Care Consult Not Complete  HRI or Home Care Consult comments SNF resident  Social Work Consult for Recovery Care Planning/Counseling Complete  Palliative Care Screening Not Applicable  Medication Review (RN Care Manager) Referral to Pharmacy  Some recent data might  be hidden

## 2019-07-18 NOTE — Progress Notes (Signed)
Patient taken off of 2 liters [place] oxygen @ 1200  for home O2 evaluation for Dr. Margo Aye.  Patient remained 98-99% on room air for past 2 hours.  Dr. Margo Aye notified and updated.  Also placed in vital sign section w/notation made.

## 2019-07-18 NOTE — Progress Notes (Signed)
Patient was concerned about a pair of sandals she was sure she had brought with her, spoke to daughter and she did not take them with her or see them while here, and then called [facility] and they checked and verified that patient had 2 pair (1 black, 1 red) pair of sandals still there.  The patient was concerned over the red pair but is now happy her red sandals are in her room at the facility.

## 2019-07-21 LAB — FOLATE RBC
Folate, Hemolysate: 438 ng/mL
Folate, RBC: 2028 ng/mL (ref >498)
Hematocrit: 21.6 % — ABNORMAL LOW (ref 34.0–46.6)

## 2019-07-30 ENCOUNTER — Other Ambulatory Visit: Payer: Self-pay

## 2019-07-30 ENCOUNTER — Encounter: Payer: Self-pay | Admitting: Internal Medicine

## 2019-07-30 ENCOUNTER — Ambulatory Visit (INDEPENDENT_AMBULATORY_CARE_PROVIDER_SITE_OTHER): Payer: Medicare HMO | Admitting: Internal Medicine

## 2019-07-30 VITALS — BP 110/70 | HR 100 | Ht 70.0 in

## 2019-07-30 DIAGNOSIS — J410 Simple chronic bronchitis: Secondary | ICD-10-CM | POA: Diagnosis not present

## 2019-07-30 NOTE — Patient Instructions (Signed)
Ok to continue Advair as prescribed and as needed albuterol.  Pulmonary Function Testing and chest xray are normal.  Ok to follow up as needed.

## 2019-07-30 NOTE — Progress Notes (Signed)
Ashley Caldwell    [id number]    20-Sep-1940  Primary Care Physician:System, Pcp Not In  Referring Physician: No referring provider defined for this encounter. Reason for Consultation: asthma Date of Consultation: 07/30/2019  Chief complaint:   Chief Complaint  Patient presents with  . Consult    hospital f/u.  Asthma, COPD.  From nursing facility  [facility] from  HPI: Ashley Caldwell is a 79 y.o. woman with history of recent fall resulting in lower extremity hemtaoma. Was hospitalized after a fall and had evacuation of the hematoma on the right side.  She is not ambulatory and is wheelchair bound.  She is following with orthopedic surgery tomorrow.  She was additionally diagnosed with atrial fibrillation with RVR.  She was discharged to skilled nursing facility.  The appointment was was made with pulmonary Prior to her discharge from hospital.  She currently denies chest pain or wheezing.  Only takes albuterol when she's sick. Usually takes about once/week when she's sob.  She is also prescribed Advair which is given twice a day.  She denies any thrush.  Active issues include depression with anxiety, recovery from this mechanical fall and severe debility, atrial fibrillation, and she also notes some cognitive impairment and memory loss.  She has not seen a neurologist about this year.  Daughter is here with her today.  They are very overwhelmed with a number of appointments the need to keep as an outpatient.  This is been a significant decline in her mother's health in the last year.  Asthma Control Test ACT Total Score  07/30/2019 18    Social History   Occupational History  . Not on file  Tobacco Use  . Smoking status: Former Smoker    Packs/day: 1.00    Years: 20.00    Pack years: 20.00    Types: Cigarettes    Quit date: 01/19/1991    Years since quitting: 28.5  . Smokeless tobacco: Never Used  Substance and Sexual Activity  . Alcohol use: No  . Drug  use: No  . Sexual activity: Not on file    Relevant family history: Family History  Problem Relation Age of Onset  . Diabetes Father   . Hypertension Mother   . Stroke Mother   . Cancer Brother   . Cancer Brother     Past Medical History:  Diagnosis Date  . Arthritis   . Asthma   . CFS (chronic fatigue syndrome)   . Compression fracture of vertebral column (HCC) 01/16/2011   per CXR  . COPD (chronic obstructive pulmonary disease) (HCC)   . DDD (degenerative disc disease), lumbar 01/18/2016  . Diabetes mellitus   . Elevated cholesterol 01/18/2016  . Fibromyalgia 01/18/2016  . Fibromyositis   . Hashimoto's thyroiditis   . HTN (hypertension), benign   . Hypoglycemia associated with diabetes (HCC) 01/16/2011  . Morbid obesity (HCC)   . Osteoarthritis of both knees 01/18/2016  . Pneumonia 12/2010  . Psoriasis 01/18/2016  . Psoriatic arthritis (HCC)   . Thyroid disease     Past Surgical History:  Procedure Laterality Date  . ABDOMINAL HYSTERECTOMY    . ABDOMINAL SURGERY     lapband  . CATARACT EXTRACTION    . HEMATOMA EVACUATION Right 07/14/2019   Procedure: EVACUATION HEMATOMA;  Surgeon: Ashley Romans, MD;  [facility]: [facility];  Service: Orthopedics;  Laterality: Right;  . KNEE SURGERY      Physical Exam:  Blood pressure 110/70, pulse 100, height 5\' 10"  (1.778 m). Gen:      No acute distress, she is in a stretcher and is chronically ill-appearing ENT:  no nasal polyps, mucus membranes moist Lungs:    No increased respiratory effort, symmetric chest wall excursion, clear to auscultation bilaterally, no wheezes or crackles CV:         Regular rate and rhythm; no murmurs, rubs, or gallops.  Abd:      + bowel sounds; soft, non-tender; no distension MSK: no acute synovitis of DIP or PIP joints, no mechanics hands.  Skin:     Chronic venous stasis Ext: Right lower extremity is wrapped in Ace bandage, left lower extremity with some weeping edema Neuro: Normal speech, some  forgetfulness Psych: alert and oriented x3, depressed mood   Data Reviewed/Medical Decision Making:  Independent interpretation of tests: Imaging: . Review of patient's chest xray April 28th images revealed no acute cardiopulmonary process. The patient's images have been independently reviewed by me.    PFTs from January 2020. I have personally reviewed the patient's PFTs and spirometry is normal without a bronchodilator response, lung volumes show mild restriction to ventilation.  Diffusion capacity is normal. PFT Results Latest Ref Rng & Units 04/16/2018  FVC-Pre L 2.01  FVC-Predicted Pre % 60  FVC-Post L 2.00  FVC-Predicted Post % 60  Pre FEV1/FVC % % 89  Post FEV1/FCV % % 91  FEV1-Pre L 1.79  FEV1-Predicted Pre % 71  FEV1-Post L 1.82  DLCO UNC% % 69  DLCO COR %Predicted % 98    Labs:   Lab Results  Component Value Date   WBC 7.8 07/18/2019   HGB 7.5 (L) 07/18/2019   HCT 21.6 (L) 07/18/2019   MCV 106.1 (H) 07/18/2019   PLT 245 07/18/2019   Lab Results  Component Value Date   NA 131 (L) 07/18/2019   K 3.7 07/18/2019   CL 95 (L) 07/18/2019   CO2 27 07/18/2019     Immunization status:  Immunization History  Administered Date(s) Administered  . Influenza, High Dose Seasonal PF 12/30/2018  . Pneumococcal Polysaccharide-23 07/13/2010    . I reviewed prior external note(s) from hospital stay earlier this year . I reviewed the result(s) of the labs and imaging as noted above.    Assessment:  COPD Atrial fibrillation with RVR Cognitive impairment, progressing  Plan/Recommendations: [name] is a 79 year old woman with a history of medical problems.  At present her COPD appears to be stable.  I would continue her Advair for now twice daily with as needed albuterol which she is using less than once a week.  I do not think that her breathing condition is her primary focus right now.  I am happy to see her back on an as-needed basis should her breathing  condition worsen.  I did caution her that taking albuterol more frequently could worsen her atrial fibrillation.  I am happy to see her back on an as-needed basis.  We discussed disease management and progression at length today.   I spent 45 minutes in the care of this patient today including pre-charting, chart review, review of results, face-to-face care, coordination of care and communication with consultants etc.).  Return to Care: Return if symptoms worsen or fail to improve, for shortness of breath.  Lenice Llamas, MD Pulmonary [facility] [place]  CC: No ref. provider found

## 2019-08-06 ENCOUNTER — Other Ambulatory Visit: Payer: Self-pay | Admitting: Nurse Practitioner

## 2019-08-06 DIAGNOSIS — R059 Cough, unspecified: Secondary | ICD-10-CM

## 2019-08-12 ENCOUNTER — Ambulatory Visit: Payer: Medicare HMO | Admitting: Family Medicine

## 2019-08-25 ENCOUNTER — Other Ambulatory Visit: Payer: Medicare HMO
